# Patient Record
Sex: Female | Born: 1990 | Race: White | Hispanic: No | Smoking: Former smoker
Health system: Southern US, Community
[De-identification: ages and names within clinical notes are randomized; demographics above are authoritative.]

## PROBLEM LIST (undated history)

## (undated) DIAGNOSIS — N3090 Cystitis, unspecified without hematuria: Secondary | ICD-10-CM

## (undated) DIAGNOSIS — A6000 Herpesviral infection of urogenital system, unspecified: Secondary | ICD-10-CM

## (undated) DIAGNOSIS — F988 Other specified behavioral and emotional disorders with onset usually occurring in childhood and adolescence: Secondary | ICD-10-CM

## (undated) DIAGNOSIS — J45909 Unspecified asthma, uncomplicated: Secondary | ICD-10-CM

## (undated) HISTORY — DX: Herpesviral infection of urogenital system, unspecified: A60.00

## (undated) HISTORY — DX: Other specified behavioral and emotional disorders with onset usually occurring in childhood and adolescence: F98.8

## (undated) HISTORY — PX: NO PAST SURGERIES: SHX2092

---
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
## 2010-12-08 DIAGNOSIS — L80 Vitiligo: Secondary | ICD-10-CM | POA: Insufficient documentation

## 2011-12-13 LAB — HM HIV SCREENING LAB: HM HIV Screening: NEGATIVE

## 2011-12-13 LAB — HM PAP SMEAR: HM Pap smear: NEGATIVE

## 2012-11-19 ENCOUNTER — Emergency Department: Payer: Self-pay | Admitting: Emergency Medicine

## 2012-12-06 ENCOUNTER — Emergency Department: Payer: Self-pay | Admitting: Emergency Medicine

## 2012-12-06 LAB — URINALYSIS, COMPLETE
Bilirubin,UR: NEGATIVE
Glucose,UR: NEGATIVE mg/dL (ref 0–75)
Ketone: NEGATIVE
Ph: 7 (ref 4.5–8.0)
Protein: 30
Specific Gravity: 1.015 (ref 1.003–1.030)
Squamous Epithelial: 4

## 2012-12-06 LAB — GC/CHLAMYDIA PROBE AMP

## 2012-12-06 LAB — WET PREP, GENITAL

## 2013-08-05 ENCOUNTER — Emergency Department: Payer: Self-pay | Admitting: Emergency Medicine

## 2013-08-05 LAB — COMPREHENSIVE METABOLIC PANEL
ALBUMIN: 3.8 g/dL (ref 3.4–5.0)
Alkaline Phosphatase: 68 U/L
Anion Gap: 6 — ABNORMAL LOW (ref 7–16)
BUN: 15 mg/dL (ref 7–18)
Bilirubin,Total: 0.3 mg/dL (ref 0.2–1.0)
CREATININE: 0.78 mg/dL (ref 0.60–1.30)
Calcium, Total: 8.6 mg/dL (ref 8.5–10.1)
Chloride: 104 mmol/L (ref 98–107)
Co2: 27 mmol/L (ref 21–32)
EGFR (African American): >60
GLUCOSE: 90 mg/dL (ref 65–99)
OSMOLALITY: 274 (ref 275–301)
Potassium: 3.8 mmol/L (ref 3.5–5.1)
SGOT(AST): 13 U/L — ABNORMAL LOW (ref 15–37)
SGPT (ALT): 20 U/L (ref 12–78)
SODIUM: 137 mmol/L (ref 136–145)
Total Protein: 7.7 g/dL (ref 6.4–8.2)

## 2013-08-05 LAB — CBC
HCT: 36.1 % (ref 35.0–47.0)
HGB: 12 g/dL (ref 12.0–16.0)
MCH: 28.9 pg (ref 26.0–34.0)
MCHC: 33.2 g/dL (ref 32.0–36.0)
MCV: 87 fL (ref 80–100)
Platelet: 242 10*3/uL (ref 150–440)
RBC: 4.15 10*6/uL (ref 3.80–5.20)
RDW: 12.8 % (ref 11.5–14.5)
WBC: 8.3 10*3/uL (ref 3.6–11.0)

## 2013-08-05 LAB — URINALYSIS, COMPLETE
BILIRUBIN, UR: NEGATIVE
BLOOD: NEGATIVE
GLUCOSE, UR: NEGATIVE mg/dL (ref 0–75)
Ketone: NEGATIVE
Leukocyte Esterase: NEGATIVE
Nitrite: NEGATIVE
PROTEIN: NEGATIVE
Ph: 6 (ref 4.5–8.0)
SPECIFIC GRAVITY: 1.027 (ref 1.003–1.030)

## 2013-10-20 ENCOUNTER — Emergency Department: Payer: Self-pay | Admitting: Emergency Medicine

## 2013-10-20 LAB — URINALYSIS, COMPLETE: Specific Gravity: 1.023 (ref 1.003–1.030)

## 2013-10-22 LAB — URINE CULTURE

## 2013-12-18 ENCOUNTER — Emergency Department: Payer: Self-pay | Admitting: Student

## 2015-01-27 IMAGING — CT CT HEAD WITHOUT CONTRAST
1 series · 16 of 30 positions shown, 20 images · non-contrast
Comparison: None.

CLINICAL DATA: Dizziness and headache. Hit in head last night.
Nausea and vomiting last night.

EXAM:
CT HEAD WITHOUT CONTRAST
TECHNIQUE: Contiguous axial images were obtained from the base of the skull
through the vertex without intravenous contrast.

[Series 2: head wo · axial · 0.45mm/px · z∈[-55,+89]mm · 16 of 36 slices shown, 20 images]
[im 2/36  brain]
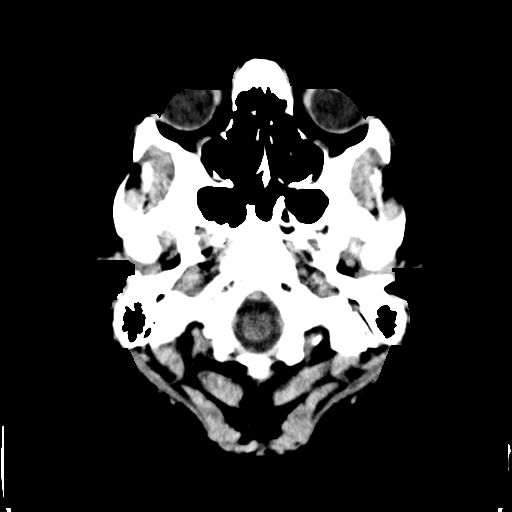
[im 2/36  bone]
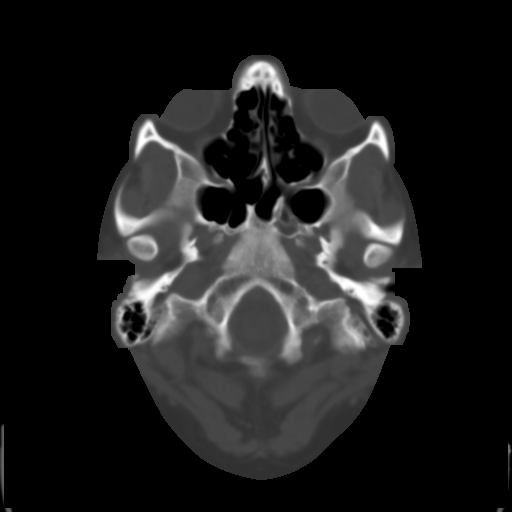
[im 4/36  brain]
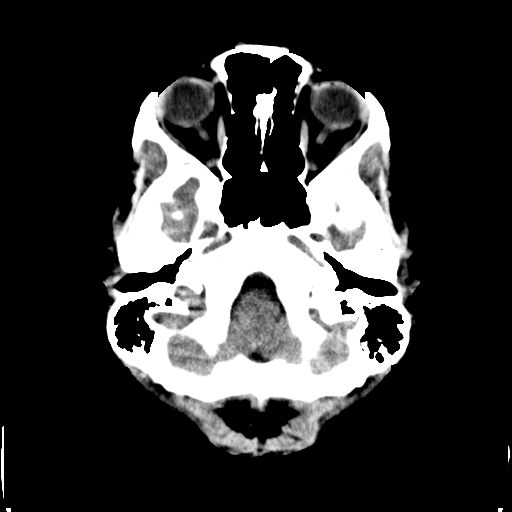
[im 7/36  brain]
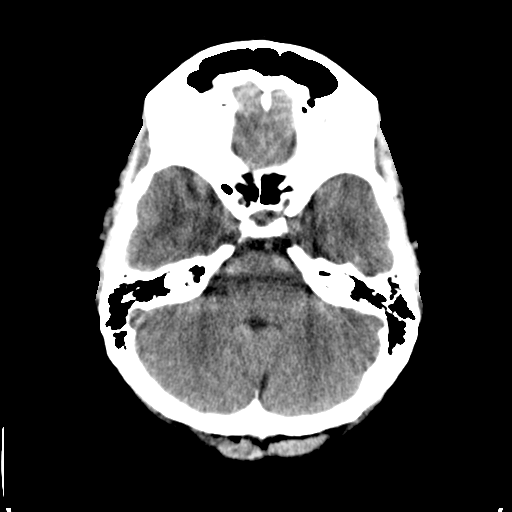
[im 9/36  brain]
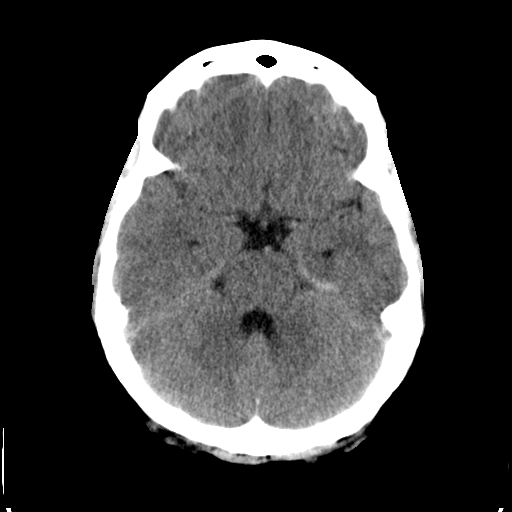
[im 10/36  brain]
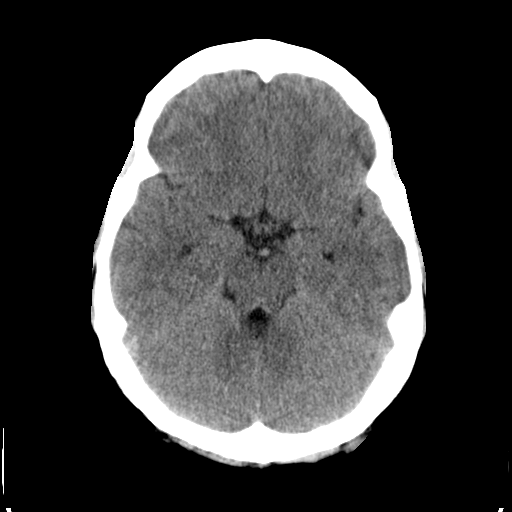
[im 10/36  bone]
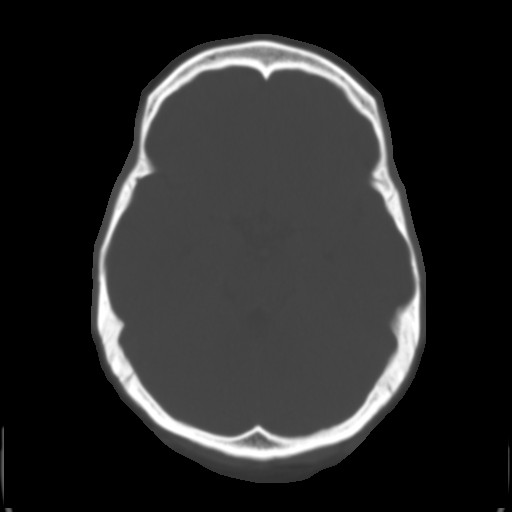
[im 13/36  brain]
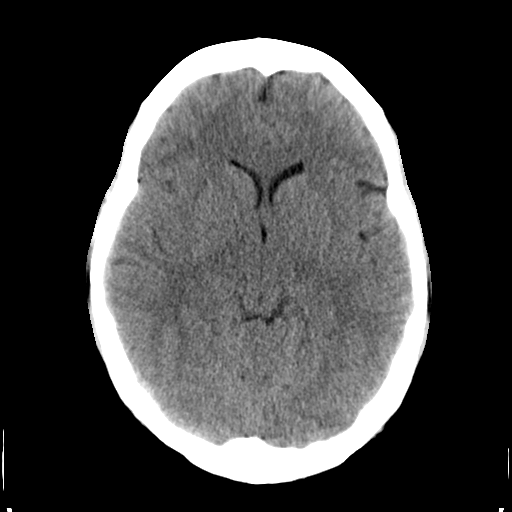
[im 15/36  brain]
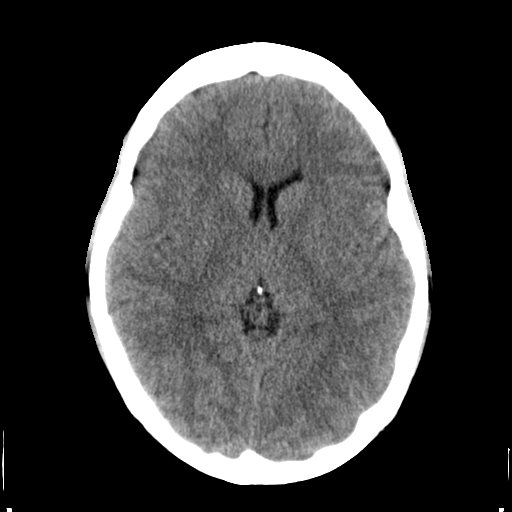
[im 17/36  brain]
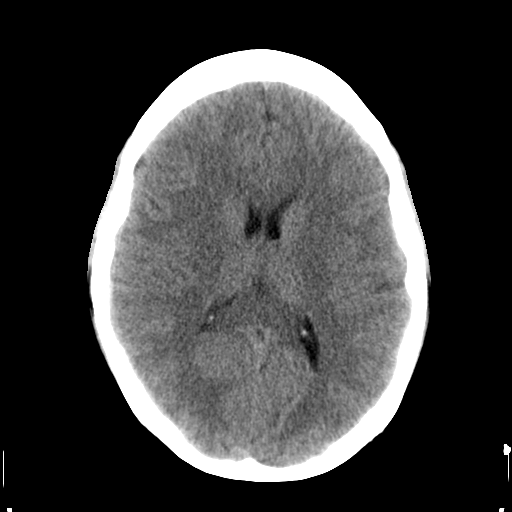
[im 19/36  brain]
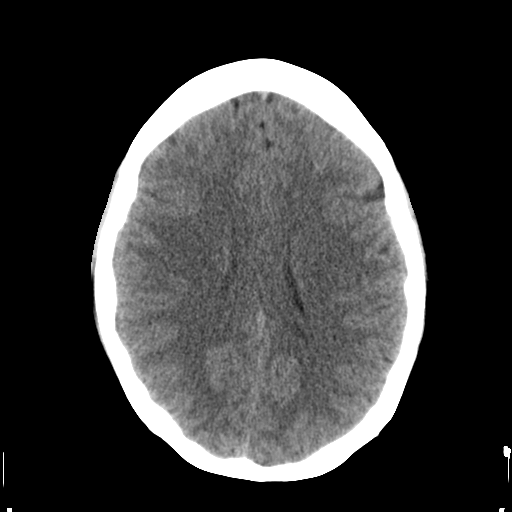
[im 19/36  bone]
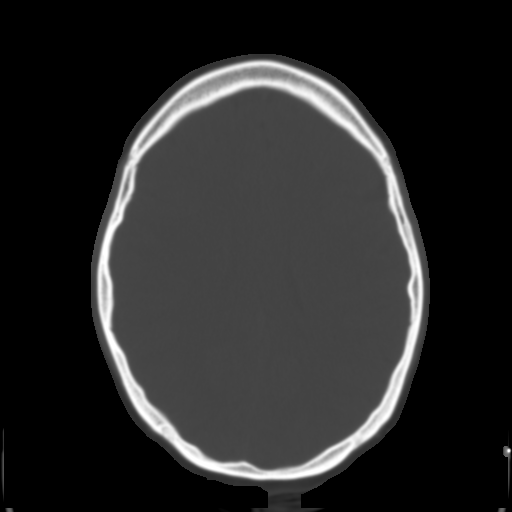
[im 21/36  brain]
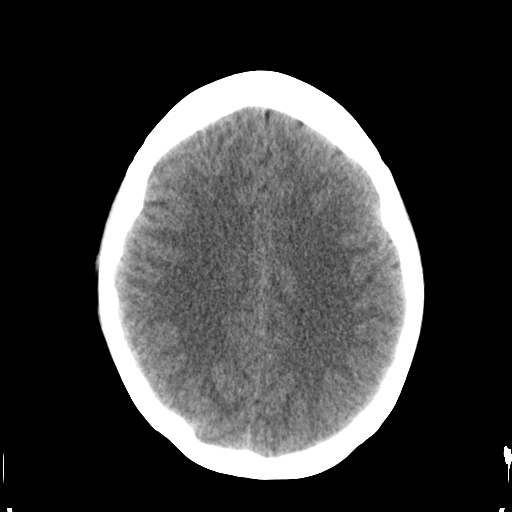
[im 23/36  brain]
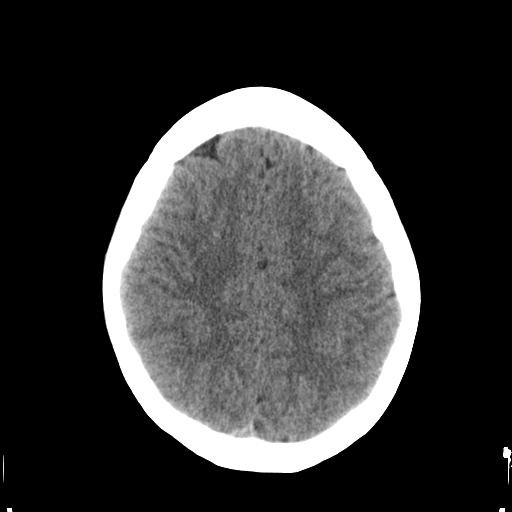
[im 26/36  brain]
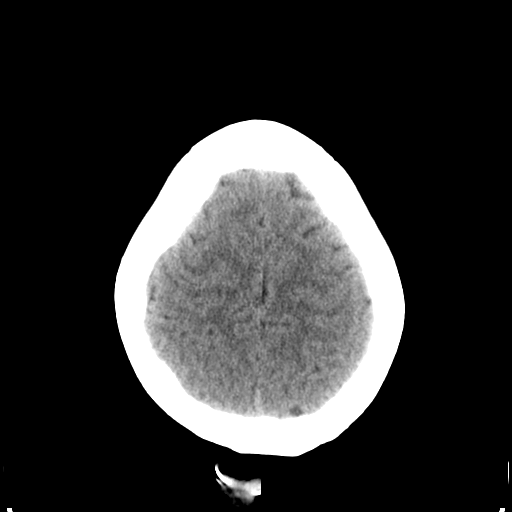
[im 27/36  brain]
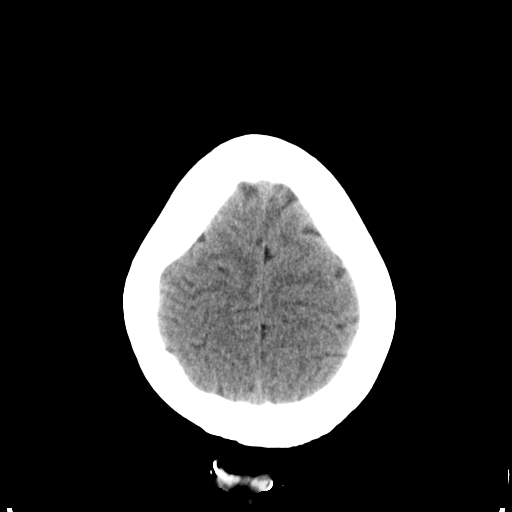
[im 27/36  bone]
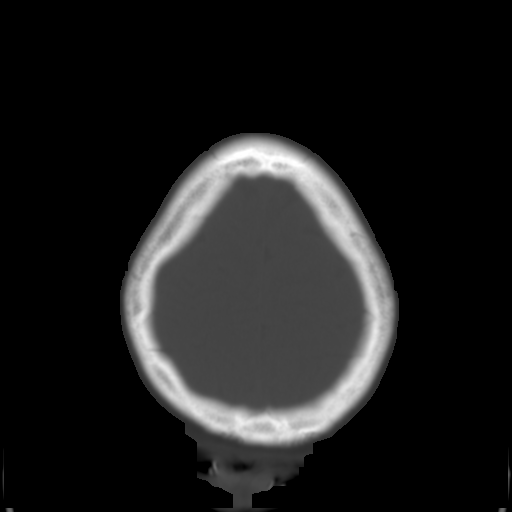
[im 29/36  brain]
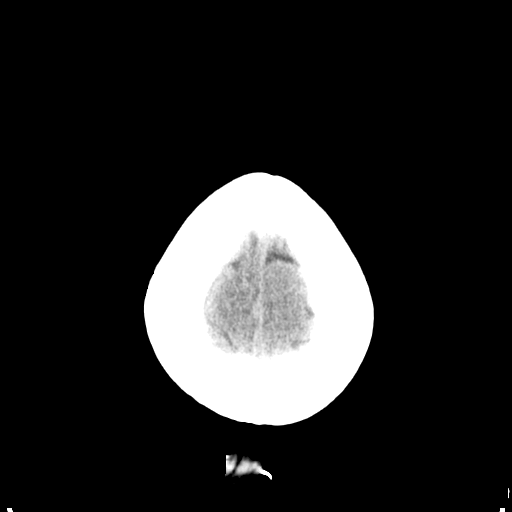
[im 32/36  brain]
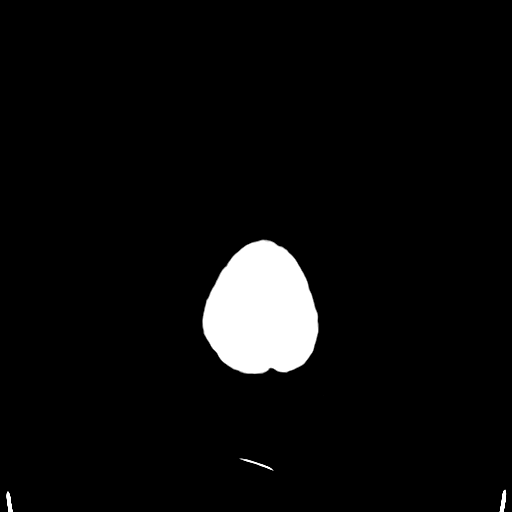
[im 34/36  brain]
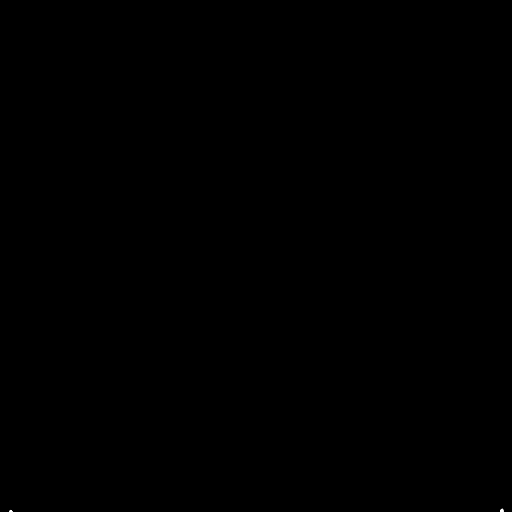

[16 of 30 positions shown; findings below may reference images not displayed]

FINDINGS: No evidence of an acute infarct, acute hemorrhage, mass lesion, mass
effect or hydrocephalus. No air-fluid levels in the paranasal
sinuses or mastoid air cells. No fracture.
IMPRESSION: Negative.

## 2015-05-17 ENCOUNTER — Emergency Department
Admission: EM | Admit: 2015-05-17 | Discharge: 2015-05-17 | Disposition: A | Discharge status: Home / Self Care | Payer: Self-pay | Attending: Emergency Medicine | Admitting: Emergency Medicine

## 2015-05-17 ENCOUNTER — Encounter: Payer: Self-pay | Admitting: Medical Oncology

## 2015-05-17 DIAGNOSIS — J4521 Mild intermittent asthma with (acute) exacerbation: Secondary | ICD-10-CM | POA: Insufficient documentation

## 2015-05-17 DIAGNOSIS — J4520 Mild intermittent asthma, uncomplicated: Secondary | ICD-10-CM

## 2015-05-17 HISTORY — DX: Unspecified asthma, uncomplicated: J45.909

## 2015-05-17 MED ORDER — IPRATROPIUM-ALBUTEROL 0.5-2.5 (3) MG/3ML IN SOLN
3.0000 mL | Freq: Once | RESPIRATORY_TRACT | Status: AC
  Administered 2015-05-17: 3 mL via RESPIRATORY_TRACT

## 2015-05-17 MED ORDER — PREDNISONE 10 MG PO TABS: 50.0000 mg | ORAL_TABLET | Freq: Every day | ORAL | Status: DC

## 2015-05-17 MED ORDER — ALBUTEROL SULFATE HFA 108 (90 BASE) MCG/ACT IN AERS: 2.0000 | INHALATION_SPRAY | Freq: Four times a day (QID) | RESPIRATORY_TRACT | Status: DC | PRN

## 2015-05-17 NOTE — ED Notes (Signed)
Pt reports hx of asthma, woke up this am having wheezing and sob. Out of inhaler.

## 2015-05-17 NOTE — Discharge Instructions (Signed)

## 2015-05-17 NOTE — ED Provider Notes (Signed)
[facility] Emergency Department Provider Note  ____________________________________________  Time seen: Approximately 10:01 AM  I have reviewed the triage vital signs and the nursing notes.   HISTORY  Chief Complaint Asthma    HPI ELESHIA WOOLEY is a 25 y.o. female with a history of asthma woke up this morning wheezing and shortness of breath. Patient reports that she is out of her inhaler. She denies any fever chills nausea vomiting or productive cough. Patient states that she's had asthma for greater than 10 years, currently does not have any insurance and cannot always afford inhaler. No known triggering factor just happens periodically.   Past Medical History  Diagnosis Date  . Asthma     There are no active problems to display for this patient.   History reviewed. No pertinent past surgical history.  Current Outpatient Rx  Name  Route  Sig  Dispense  Refill  . albuterol (PROVENTIL HFA;VENTOLIN HFA) 108 (90 Base) MCG/ACT inhaler   Inhalation   Inhale 2 puffs into the lungs every 6 (six) hours as needed for wheezing or shortness of breath.   1 Inhaler   11   . predniSONE (DELTASONE) 10 MG tablet   Oral   Take 5 tablets (50 mg total) by mouth daily with breakfast.   25 tablet   0     Allergies Review of patient's allergies indicates no known allergies.  No family history on file.  Social History Social History  Substance Use Topics  . Smoking status: Never Smoker   . Smokeless tobacco: None  . Alcohol Use: None    Review of Systems Constitutional: No fever/chills Eyes: No visual changes. ENT: No sore throat. Cardiovascular: Denies chest pain. Respiratory: Positive for shortness of breath and wheezing. Gastrointestinal: No abdominal pain.  No nausea, no vomiting.  No diarrhea.  No constipation. Genitourinary: Negative for dysuria. Musculoskeletal: Negative for back pain. Skin: Negative for rash. Neurological: Negative for  headaches, focal weakness or numbness.  10-point ROS otherwise negative.  ____________________________________________   PHYSICAL EXAM:  VITAL SIGNS: ED Triage Vitals  Enc Vitals Group     BP 05/17/15 0957 126/65 mmHg     Pulse Rate 05/17/15 0955 78     Resp 05/17/15 0955 20     Temp 05/17/15 0955 98.6 F (37 C)     Temp Source 05/17/15 0955 Oral     SpO2 05/17/15 0955 100 %     Weight 05/17/15 0955 240 lb (108.863 kg)     Height 05/17/15 0955  (1.803 m)     Head Cir --      Peak Flow --      Pain Score --      Pain Loc --      Pain Edu? --      Excl. in GC? --     Constitutional: Alert and oriented. Well appearing and in no acute distress. Eyes: Conjunctivae are normal. PERRL. EOMI. Head: Atraumatic. Nose: No congestion/rhinnorhea. Mouth/Throat: Mucous membranes are moist.  Oropharynx non-erythematous. Neck: No stridor.   Cardiovascular: Normal rate, regular rhythm. Grossly normal heart sounds.  Good peripheral circulation. Respiratory: Normal respiratory effort.  No retractions. Lungs decreased breath sounds bilaterally with active wheezing noted. And eat. Neurologic:  Normal speech and language. No gross focal neurologic deficits are appreciated. No gait instability. Skin:  Skin is warm, dry and intact. No rash noted. Psychiatric: Mood and affect are normal. Speech and behavior are normal.  ____________________________________________   LABS (all  labs ordered are listed, but only abnormal results are displayed)  Labs Reviewed - No data to display   RADIOLOGY  Deferred at this visit ____________________________________________   PROCEDURES  Procedure(s) performed: None  Critical Care performed: No  ____________________________________________   INITIAL IMPRESSION / ASSESSMENT AND PLAN / ED COURSE  Pertinent labs & imaging results that were available during my care of the patient were reviewed by me and considered in my medical decision making  (see chart for details).  Acute exacerbation of asthma. DuoNeb 1 given with significant relief and improvement. Patient will be discharged with new Rx for albuterol inhaler and follow-up with her PCP as soon as possible. She voices no other emergency medical complaints at this time and discharged from the hospital much improved. ____________________________________________   FINAL CLINICAL IMPRESSION(S) / ED DIAGNOSES  Final diagnoses:  Asthma, mild intermittent, uncomplicated     This chart was dictated using voice recognition software/Dragon. Despite best efforts to proofread, errors can occur which can change the meaning. Any change was purely unintentional.   Evangeline Dakin, PA-C 05/17/15 1051  Myrna Blazer, MD 05/17/15 [id number]

## 2015-05-17 NOTE — ED Notes (Addendum)
Pt states she ran out of her inhaler last night. She uses an Albuterol inhaler. Recently has been sick last per patient.  Pt is short of breath easily when speaking and is able to speak only in short sentences.

## 2017-05-28 ENCOUNTER — Other Ambulatory Visit: Payer: Self-pay

## 2017-05-28 ENCOUNTER — Encounter: Payer: Self-pay | Admitting: Gynecology

## 2017-05-28 ENCOUNTER — Ambulatory Visit
Admission: EM | Admit: 2017-05-28 | Discharge: 2017-05-28 | Disposition: A | Discharge status: Home / Self Care | Payer: Self-pay | Attending: Family Medicine | Admitting: Family Medicine

## 2017-05-28 DIAGNOSIS — Z202 Contact with and (suspected) exposure to infections with a predominantly sexual mode of transmission: Secondary | ICD-10-CM

## 2017-05-28 DIAGNOSIS — Z3202 Encounter for pregnancy test, result negative: Secondary | ICD-10-CM

## 2017-05-28 DIAGNOSIS — Z113 Encounter for screening for infections with a predominantly sexual mode of transmission: Secondary | ICD-10-CM

## 2017-05-28 LAB — CHLAMYDIA/NGC RT PCR (ARMC ONLY)
CHLAMYDIA TR: DETECTED — AB
N GONORRHOEAE: NOT DETECTED

## 2017-05-28 LAB — PREGNANCY, URINE: PREG TEST UR: NEGATIVE

## 2017-05-28 MED ORDER — AZITHROMYCIN 500 MG PO TABS
1000.0000 mg | ORAL_TABLET | Freq: Every day | ORAL | Status: DC
  Administered 2017-05-28: 1000 mg via ORAL

## 2017-05-28 MED ORDER — CEFTRIAXONE SODIUM 250 MG IJ SOLR
250.0000 mg | Freq: Once | INTRAMUSCULAR | Status: AC
  Administered 2017-05-28: 250 mg via INTRAMUSCULAR

## 2017-05-28 NOTE — ED Provider Notes (Signed)
[facility] ____________________________________________  Time seen: Approximately 1:25 PM  I have reviewed the triage vital signs and the nursing notes.   HISTORY  Chief Complaint Exposure to STD  HPI Diane Williamson is a 27 y.o. female presenting for evaluation and screening for STDs.  Patient reports that she was told yesterday by her husband that he tested positive for chlamydia.  Patient states that her last sexual intercourse was approximately 3 days prior.  Intermittently uses condoms, not always.  Patient states that she is asymptomatic.  Denies any abdominal pain, pelvic pain, vaginal discomfort, vaginal discharge, vaginal odor, back pain, fevers, dysuria or other complaints.  No rash or lesions.  Reports otherwise feels fine.  States he would like to go ahead and have other STD testing performed.  Denies recent antibiotic use. Denies recent sickness.    Past Medical History:  Diagnosis Date  . Asthma     There are no active problems to display for this patient.   History reviewed. No pertinent surgical history.    Current Facility-Administered Medications:  .  azithromycin (ZITHROMAX) tablet 1,000 mg, 1,000 mg, Oral, Daily, Renford Dills, NP, 1,000 mg at 05/28/17 1320  Current Outpatient Medications:  .  albuterol (PROVENTIL HFA;VENTOLIN HFA) 108 (90 Base) MCG/ACT inhaler, Inhale 2 puffs into the lungs every 6 (six) hours as needed for wheezing or shortness of breath., Disp: 1 Inhaler, Rfl: 11 .  fluticasone-salmeterol (ADVAIR HFA) 115-21 MCG/ACT inhaler, Inhale 2 puffs into the lungs 2 (two) times daily., Disp: , Rfl:   Allergies Benadryl allergy [diphenhydramine hcl]  Family History  Problem Relation Age of Onset  . Diabetes Mother   . Cancer Mother   . Diabetes Father   . Cancer Father     Social History Social History   Tobacco Use  . Smoking status: Former Games developer  . Smokeless tobacco: Never Used  Substance Use Topics  . Alcohol use:  Yes    Comment: occasion  . Drug use: No    Review of Systems Constitutional: No fever/chills ENT: No sore throat. Cardiovascular: Denies chest pain. Respiratory: Denies shortness of breath. Gastrointestinal: No abdominal pain.  No nausea, no vomiting.  No diarrhea.   Genitourinary: Negative for dysuria. Musculoskeletal: Negative for back pain. Skin: Negative for rash.  ____________________________________________   PHYSICAL EXAM:  VITAL SIGNS: ED Triage Vitals  Enc Vitals Group     BP 05/28/17 1243 132/79     Pulse Rate 05/28/17 1243 86     Resp -- 18     Temp 05/28/17 1243 98.2 F (36.8 C)     Temp Source 05/28/17 1243 Oral     SpO2 05/28/17 1243 100 %     Weight 05/28/17 1241 260 lb (117.9 kg)     Height 05/28/17 1241 5\' 10"  (1.778 m)     Head Circumference --      Peak Flow --      Pain Score 05/28/17 1241 0     Pain Loc --      Pain Edu? --      Excl. in GC? --     Constitutional: Alert and oriented. Well appearing and in no acute distress. Cardiovascular: Normal rate, regular rhythm. Grossly normal heart sounds.  Good peripheral circulation. Respiratory: Normal respiratory effort without tachypnea nor retractions. Breath sounds are clear and equal bilaterally. No wheezes, rales, rhonchi. Gastrointestinal: Soft and nontender.No CVA tenderness. Pelvic: Declined Musculoskeletal No midline cervical, thoracic or lumbar tenderness to palpation.  Neurologic:  Normal  speech and language. Speech is normal. No gait instability.  Skin:  Skin is warm, dry and intact. No rash noted. Psychiatric: Mood and affect are normal. Speech and behavior are normal. Patient exhibits appropriate insight and judgment   ___________________________________________   LABS (all labs ordered are listed, but only abnormal results are displayed)  Labs Reviewed  CHLAMYDIA/NGC RT PCR ([facility] ONLY)  PREGNANCY, URINE  MISC [facility] TEST (SEND OUT)  HIV ANTIBODY (ROUTINE TESTING)    HEPATITIS PANEL, ACUTE  HSV(HERPES SIMPLEX VRS) I + II AB-IGG  HSV(HERPES SIMPLEX VRS) I + II AB-IGM  RPR   ____________________________________________   PROCEDURES Procedures   INITIAL IMPRESSION / ASSESSMENT AND PLAN / ED COURSE  Pertinent labs & imaging results that were available during my care of the patient were reviewed by me and considered in my medical decision making (see chart for details).  Well-appearing patient.  No acute distress.  Presenting for testing and screening for STDs.  Positive exposure to chlamydia from his spouse.  Discussed will go ahead and treat prophylactically for gonorrhea and chlamydia, 1 g oral azithromycin and 250 mg IM Rocephin given once in urgent care.  Discussed testing, patient requested gonorrhea, chlamydia and trichomonas urine test, declined pelvic exam.  Also serum HIV hepatitis herpes and RPR.  Discussed no sexual activity for at least 1 week.  Encourage follow-up prior to resuming.  Encourage supportive care and safe sex.   Discussed follow up with Primary care physician this week. Discussed follow up and return parameters including no resolution or any worsening concerns. Patient verbalized understanding and agreed to plan.   ____________________________________________   FINAL CLINICAL IMPRESSION(S) / ED DIAGNOSES  Final diagnoses:  STD exposure  Screen for STD (sexually transmitted disease)     ED Discharge Orders    None       Note: This dictation was prepared with Dragon dictation along with smaller phrase technology. Any transcriptional errors that result from this process are unintentional.         Renford DillsMiller, Joannah Gitlin, NP 05/28/17 1346

## 2017-05-28 NOTE — Discharge Instructions (Signed)
No sexual activity as discussed. Follow up prior to resuming.   Follow up with your primary care physician or the above this week as needed. Return to Urgent care for new or worsening concerns.

## 2017-05-28 NOTE — ED Triage Notes (Signed)
Per patient husband told her he was tested positive for chlamydia.

## 2017-05-30 ENCOUNTER — Ambulatory Visit
Admission: EM | Admit: 2017-05-30 | Discharge: 2017-05-30 | Disposition: A | Discharge status: Home / Self Care | Payer: Self-pay | Attending: Family Medicine | Admitting: Family Medicine

## 2017-05-30 ENCOUNTER — Other Ambulatory Visit: Payer: Self-pay

## 2017-05-30 DIAGNOSIS — N3001 Acute cystitis with hematuria: Secondary | ICD-10-CM

## 2017-05-30 DIAGNOSIS — A599 Trichomoniasis, unspecified: Secondary | ICD-10-CM

## 2017-05-30 LAB — HEPATITIS PANEL, ACUTE
HCV Ab: <0.1 s/co ratio (ref 0.0–0.9)
Hep A IgM: NEGATIVE
Hep B C IgM: NEGATIVE
Hepatitis B Surface Ag: NEGATIVE

## 2017-05-30 LAB — URINALYSIS, COMPLETE (UACMP) WITH MICROSCOPIC
Bilirubin Urine: NEGATIVE
GLUCOSE, UA: NEGATIVE mg/dL
HGB URINE DIPSTICK: NEGATIVE
Ketones, ur: NEGATIVE mg/dL
NITRITE: NEGATIVE
Protein, ur: NEGATIVE mg/dL
Specific Gravity, Urine: >1.03 — ABNORMAL HIGH (ref 1.005–1.030)
pH: 5.5 (ref 5.0–8.0)

## 2017-05-30 LAB — HIV ANTIBODY (ROUTINE TESTING W REFLEX): HIV Screen 4th Generation wRfx: NONREACTIVE

## 2017-05-30 LAB — RPR: RPR: NONREACTIVE

## 2017-05-30 LAB — HSV(HERPES SIMPLEX VRS) I + II AB-IGG: HSV 1 Glycoprotein G Ab, IgG: <0.91 index (ref 0.00–0.90)

## 2017-05-30 MED ORDER — CEPHALEXIN 500 MG PO CAPS: 500.0000 mg | ORAL_CAPSULE | Freq: Two times a day (BID) | ORAL | 0 refills | Status: DC

## 2017-05-30 MED ORDER — METRONIDAZOLE 500 MG PO TABS: ORAL_TABLET | ORAL | 0 refills | Status: DC

## 2017-05-30 NOTE — ED Triage Notes (Signed)
Pt reports burning with urination starting this a.m. Recently treated for GC/Chlamydia.

## 2017-05-30 NOTE — ED Provider Notes (Signed)
[facility]    CSN: [id number] Arrival date & time: 05/30/17  1631     History   Chief Complaint Chief Complaint  Patient presents with  . Dysuria    HPI Diane Williamson is a 27 y.o. female.   The history is provided by the patient.  Dysuria  Pain quality:  Burning Pain severity:  Mild Onset quality:  Sudden Duration:  12 hours Timing:  Constant Progression:  Unchanged Chronicity:  New Recent urinary tract infections: no   Relieved by:  None tried Ineffective treatments:  None tried Urinary symptoms: frequent urination   Associated symptoms: no abdominal pain, no fever, no flank pain, no genital lesions, no nausea, no vaginal discharge and no vomiting   Risk factors: sexually active and sexually transmitted infections (recently seen and treated for chlamydia)   Risk factors: no hx of pyelonephritis, no hx of urolithiasis, no kidney transplant, not pregnant, no recurrent urinary tract infections, no renal cysts, no renal disease, no single kidney and no urinary catheter     Past Medical History:  Diagnosis Date  . Asthma     There are no active problems to display for this patient.   History reviewed. No pertinent surgical history.  OB History    No data available       Home Medications    Prior to Admission medications   Medication Sig Start Date End Date Taking? Authorizing Provider  albuterol (PROVENTIL HFA;VENTOLIN HFA) 108 (90 Base) MCG/ACT inhaler Inhale 2 puffs into the lungs every 6 (six) hours as needed for wheezing or shortness of breath. 05/17/15   Beers, Charmayne Sheer, PA-C  cephALEXin (KEFLEX) 500 MG capsule Take 1 capsule (500 mg total) by mouth 2 (two) times daily. 05/30/17   Payton Mccallum, MD  fluticasone-salmeterol (ADVAIR HFA) 272-53 MCG/ACT inhaler Inhale 2 puffs into the lungs 2 (two) times daily.    [provider]  metroNIDAZOLE (FLAGYL) 500 MG tablet Take 4 tablets po once 05/30/17   Payton Mccallum, MD    Family  History Family History  Problem Relation Age of Onset  . Diabetes Mother   . Cancer Mother   . Diabetes Father   . Cancer Father     Social History Social History   Tobacco Use  . Smoking status: Former Games developer  . Smokeless tobacco: Never Used  Substance Use Topics  . Alcohol use: Yes    Comment: occasion  . Drug use: No     Allergies   Benadryl allergy [diphenhydramine hcl]   Review of Systems Review of Systems  Constitutional: Negative for fever.  Gastrointestinal: Negative for abdominal pain, nausea and vomiting.  Genitourinary: Positive for dysuria. Negative for flank pain and vaginal discharge.     Physical Exam Triage Vital Signs ED Triage Vitals  Enc Vitals Group     BP 05/30/17 1659 125/75     Pulse Rate 05/30/17 1659 90     Resp 05/30/17 1659 18     Temp 05/30/17 1659 98.4 F (36.9 C)     Temp Source 05/30/17 1659 Oral     SpO2 05/30/17 1659 100 %     Weight 05/30/17 1701 260 lb (117.9 kg)     Height 05/30/17 1701 5\' 10"  (1.778 m)     Head Circumference --      Peak Flow --      Pain Score 05/30/17 1701 3     Pain Loc --      Pain Edu? --  Excl. in GC? --    No data found.  Updated Vital Signs BP 125/75 (BP Location: Left Arm)   Pulse 90   Temp 98.4 F (36.9 C) (Oral)   Resp 18   Ht 5\' 10"  (1.778 m)   Wt 260 lb (117.9 kg)   LMP 04/29/2017   SpO2 100%   BMI 37.31 kg/m   Visual Acuity Right Eye Distance:   Left Eye Distance:   Bilateral Distance:    Right Eye Near:   Left Eye Near:    Bilateral Near:     Physical Exam  Constitutional: She appears well-developed and well-nourished. No distress.  Abdominal: Soft. She exhibits no distension.  Skin: She is not diaphoretic.  Nursing note and vitals reviewed.    UC Treatments / Results  Labs (all labs ordered are listed, but only abnormal results are displayed) Labs Reviewed  URINALYSIS, COMPLETE (UACMP) WITH MICROSCOPIC - Abnormal; Notable for the following components:       Result Value   APPearance HAZY (*)    Specific Gravity, Urine >1.030 (*)    Leukocytes, UA MODERATE (*)    Squamous Epithelial / LPF 6-30 (*)    Bacteria, UA FEW (*)    All other components within normal limits  URINE CULTURE    EKG  EKG Interpretation None       Radiology No results found.  Procedures Procedures (including critical care time)  Medications Ordered in UC Medications - No data to display   Initial Impression / Assessment and Plan / UC Course  I have reviewed the triage vital signs and the nursing notes.  Pertinent labs & imaging results that were available during my care of the patient were reviewed by me and considered in my medical decision making (see chart for details).       Final Clinical Impressions(s) / UC Diagnoses   Final diagnoses:  Acute cystitis with hematuria  Trichomonas infection    ED Discharge Orders        Ordered    cephALEXin (KEFLEX) 500 MG capsule  2 times daily     05/30/17 1726    metroNIDAZOLE (FLAGYL) 500 MG tablet     05/30/17 1740     1. Lab results and diagnosis reviewed with patient 2. rx as per orders above; reviewed possible side effects, interactions, risks and benefits  3. Recommend supportive treatment with increased water intake 4. Follow-up prn if symptoms worsen or don't improve  Controlled Substance Prescriptions [place] Controlled Substance Registry consulted? Not Applicable   Payton Mccallumonty, Atlee Kluth, MD 05/30/17 1743

## 2017-05-31 LAB — HSV(HERPES SIMPLEX VRS) I + II AB-IGM

## 2017-06-01 LAB — URINE CULTURE: SPECIAL REQUESTS: NORMAL

## 2017-06-01 LAB — MISC LABCORP TEST (SEND OUT): LABCORP TEST CODE: 188052

## 2017-06-07 ENCOUNTER — Ambulatory Visit
Admission: EM | Admit: 2017-06-07 | Discharge: 2017-06-07 | Disposition: A | Discharge status: Home / Self Care | Payer: Self-pay | Attending: Family Medicine | Admitting: Family Medicine

## 2017-06-07 ENCOUNTER — Other Ambulatory Visit: Payer: Self-pay

## 2017-06-07 DIAGNOSIS — R350 Frequency of micturition: Secondary | ICD-10-CM

## 2017-06-07 LAB — URINALYSIS, COMPLETE (UACMP) WITH MICROSCOPIC
Bilirubin Urine: NEGATIVE
GLUCOSE, UA: NEGATIVE mg/dL
LEUKOCYTES UA: NEGATIVE
NITRITE: NEGATIVE
Protein, ur: NEGATIVE mg/dL
Specific Gravity, Urine: >1.03 — ABNORMAL HIGH (ref 1.005–1.030)
WBC UA: NONE SEEN WBC/hpf (ref 0–5)
pH: 6 (ref 5.0–8.0)

## 2017-06-07 MED ORDER — SULFAMETHOXAZOLE-TRIMETHOPRIM 800-160 MG PO TABS: 1.0000 | ORAL_TABLET | Freq: Two times a day (BID) | ORAL | 0 refills | Status: DC

## 2017-06-07 NOTE — ED Provider Notes (Signed)
[facility]    CSN: [id number] Arrival date & time: 06/07/17  1518     History   Chief Complaint Chief Complaint  Patient presents with  . Urinary Frequency    HPI Diane Williamson is a 27 y.o. female.   The history is provided by the patient.  Urinary Frequency  This is a new problem. The current episode started more than 1 week ago. The problem occurs constantly. Pertinent negatives include no chest pain, no abdominal pain, no headaches and no shortness of breath. Associated symptoms comments: Denies vomiting, fevers, chills.. The symptoms are relieved by medications (improved when treated recently with keflex).    Past Medical History:  Diagnosis Date  . Asthma     There are no active problems to display for this patient.   Past Surgical History:  Procedure Laterality Date  . NO PAST SURGERIES      OB History    No data available       Home Medications    Prior to Admission medications   Medication Sig Start Date End Date Taking? Authorizing Provider  albuterol (PROVENTIL HFA;VENTOLIN HFA) 108 (90 Base) MCG/ACT inhaler Inhale 2 puffs into the lungs every 6 (six) hours as needed for wheezing or shortness of breath. 05/17/15  Yes Beers, Charmayne Sheer, PA-C  fluticasone-salmeterol (ADVAIR HFA) 115-21 MCG/ACT inhaler Inhale 2 puffs into the lungs 2 (two) times daily.   Yes [provider]  cephALEXin (KEFLEX) 500 MG capsule Take 1 capsule (500 mg total) by mouth 2 (two) times daily. 05/30/17   Payton Mccallum, MD  metroNIDAZOLE (FLAGYL) 500 MG tablet Take 4 tablets po once 05/30/17   Payton Mccallum, MD  sulfamethoxazole-trimethoprim (BACTRIM DS,SEPTRA DS) 800-160 MG tablet Take 1 tablet by mouth 2 (two) times daily. 06/07/17   Payton Mccallum, MD    Family History Family History  Problem Relation Age of Onset  . Diabetes Mother   . Cancer Mother   . Diabetes Father   . Cancer Father     Social History Social History   Tobacco Use  . Smoking  status: Former Games developer  . Smokeless tobacco: Never Used  Substance Use Topics  . Alcohol use: Yes    Comment: occasion  . Drug use: No     Allergies   Benadryl allergy [diphenhydramine hcl]   Review of Systems Review of Systems  Respiratory: Negative for shortness of breath.   Cardiovascular: Negative for chest pain.  Gastrointestinal: Negative for abdominal pain.  Genitourinary: Positive for frequency.  Neurological: Negative for headaches.     Physical Exam Triage Vital Signs ED Triage Vitals  Enc Vitals Group     BP 06/07/17 1529 133/75     Pulse Rate 06/07/17 1529 81     Resp 06/07/17 1529 16     Temp 06/07/17 1529 98.5 F (36.9 C)     Temp Source 06/07/17 1529 Oral     SpO2 06/07/17 1529 100 %     Weight 06/07/17 1529 260 lb (117.9 kg)     Height 06/07/17 1529 5\' 10"  (1.778 m)     Head Circumference --      Peak Flow --      Pain Score 06/07/17 1527 0     Pain Loc --      Pain Edu? --      Excl. in GC? --    No data found.  Updated Vital Signs BP 133/75 (BP Location: Left Arm)   Pulse 81  Temp 98.5 F (36.9 C) (Oral)   Resp 16   Ht 5\' 10"  (1.778 m)   Wt 260 lb (117.9 kg)   LMP 06/04/2017   SpO2 100%   BMI 37.31 kg/m   Visual Acuity Right Eye Distance:   Left Eye Distance:   Bilateral Distance:    Right Eye Near:   Left Eye Near:    Bilateral Near:     Physical Exam  Constitutional: She appears well-developed and well-nourished. No distress.  Abdominal: Soft. She exhibits no distension.  Skin: She is not diaphoretic.  Nursing note and vitals reviewed.    UC Treatments / Results  Labs (all labs ordered are listed, but only abnormal results are displayed) Labs Reviewed  URINALYSIS, COMPLETE (UACMP) WITH MICROSCOPIC - Abnormal; Notable for the following components:      Result Value   APPearance HAZY (*)    Specific Gravity, Urine >1.030 (*)    Hgb urine dipstick MODERATE (*)    Ketones, ur TRACE (*)    Squamous Epithelial / LPF  6-30 (*)    Bacteria, UA RARE (*)    All other components within normal limits  URINE CULTURE    EKG  EKG Interpretation None       Radiology No results found.  Procedures Procedures (including critical care time)  Medications Ordered in UC Medications - No data to display   Initial Impression / Assessment and Plan / UC Course  I have reviewed the triage vital signs and the nursing notes.  Pertinent labs & imaging results that were available during my care of the patient were reviewed by me and considered in my medical decision making (see chart for details).       Final Clinical Impressions(s) / UC Diagnoses   Final diagnoses:  Urinary frequency    ED Discharge Orders        Ordered    sulfamethoxazole-trimethoprim (BACTRIM DS,SEPTRA DS) 800-160 MG tablet  2 times daily     06/07/17 1606     1. Lab results and diagnosis reviewed with patient 2. rx as per orders above; reviewed possible side effects, interactions, risks and benefits  3. Check urine culture 4.Recommend supportive treatment with increased fluids 5. Follow-up prn if symptoms worsen or don't improve  Controlled Substance Prescriptions [place] Controlled Substance Registry consulted? Not Applicable   Payton Mccallumonty, Asjia Berrios, MD 06/07/17 [phone]

## 2017-06-07 NOTE — ED Triage Notes (Signed)
Patient complains of urinary frequency that started 05/28/2017. Patient states that she has had chills the entire time. Patient reports that she was seen and treated with Keflex and this helped symptoms while on medication but no improvement since.

## 2017-06-18 ENCOUNTER — Ambulatory Visit
Admission: EM | Admit: 2017-06-18 | Discharge: 2017-06-18 | Disposition: A | Discharge status: Home / Self Care | Payer: Self-pay | Attending: Family Medicine | Admitting: Family Medicine

## 2017-06-18 ENCOUNTER — Encounter: Payer: Self-pay | Admitting: Gynecology

## 2017-06-18 ENCOUNTER — Other Ambulatory Visit: Payer: Self-pay

## 2017-06-18 DIAGNOSIS — N390 Urinary tract infection, site not specified: Secondary | ICD-10-CM

## 2017-06-18 DIAGNOSIS — B373 Candidiasis of vulva and vagina: Secondary | ICD-10-CM

## 2017-06-18 DIAGNOSIS — N760 Acute vaginitis: Secondary | ICD-10-CM

## 2017-06-18 DIAGNOSIS — Z113 Encounter for screening for infections with a predominantly sexual mode of transmission: Secondary | ICD-10-CM

## 2017-06-18 DIAGNOSIS — B3731 Acute candidiasis of vulva and vagina: Secondary | ICD-10-CM

## 2017-06-18 LAB — URINALYSIS, COMPLETE (UACMP) WITH MICROSCOPIC
BILIRUBIN URINE: NEGATIVE
Glucose, UA: NEGATIVE mg/dL
Hgb urine dipstick: NEGATIVE
KETONES UR: NEGATIVE mg/dL
Leukocytes, UA: NEGATIVE
Nitrite: NEGATIVE
PH: 7.5 (ref 5.0–8.0)
Protein, ur: NEGATIVE mg/dL
Specific Gravity, Urine: 1.02 (ref 1.005–1.030)

## 2017-06-18 LAB — WET PREP, GENITAL
Clue Cells Wet Prep HPF POC: NONE SEEN
Sperm: NONE SEEN
TRICH WET PREP: NONE SEEN

## 2017-06-18 LAB — CHLAMYDIA/NGC RT PCR (ARMC ONLY)
CHLAMYDIA TR: NOT DETECTED
N gonorrhoeae: NOT DETECTED

## 2017-06-18 MED ORDER — NITROFURANTOIN MONOHYD MACRO 100 MG PO CAPS: 100.0000 mg | ORAL_CAPSULE | Freq: Two times a day (BID) | ORAL | 0 refills | Status: DC

## 2017-06-18 MED ORDER — FLUCONAZOLE 150 MG PO TABS: 150.0000 mg | ORAL_TABLET | Freq: Every day | ORAL | 0 refills | Status: DC

## 2017-06-18 NOTE — Discharge Instructions (Signed)
Take medication as prescribed. Rest. Drink plenty of fluids.  ° °Follow up with your primary care physician this week as needed. Return to Urgent care for new or worsening concerns.  ° °

## 2017-06-18 NOTE — ED Provider Notes (Signed)
[facility] ____________________________________________  Time seen: Approximately 4:52 PM  I have reviewed the triage vital signs and the nursing notes.   HISTORY  Chief Complaint Urinary Tract Infection and SEXUALLY TRANSMITTED DISEASE   HPI Diane Williamson is a 27 y.o. female presenting for evaluation of some urinary frequency, low back pain, vaginal irritation, vaginal itching and vaginal discharge that is been present over the last few days.  Patient has been seen multiple times in the last month in the urgent care for similar.  Patient has recently been treated for UTIs twice with Keflex and Bactrim, as well as treated with Flagyl for trichomonas and received treatment for chlamydia.  Patient states that she has not had sexual activity since prior to first visit of trichomonas and Chlamydia being positive.  Patient states that she wants to make sure that the STDs have fully been treated.  Again states no recent sexual activity.  Denies chance of pregnancy.  States some intermittent lower pelvic cramping, denies other abdominal pain.  Denies fevers.  Reports continues to eat and drink well.  Has not taken any over-the-counter medications for similar complaints.  Reports otherwise feels well. Denies chest pain, shortness of breath, or rash.    Past Medical History:  Diagnosis Date  . Asthma     There are no active problems to display for this patient.   Past Surgical History:  Procedure Laterality Date  . NO PAST SURGERIES       No current facility-administered medications for this encounter.   Current Outpatient Medications:  .  albuterol (PROVENTIL HFA;VENTOLIN HFA) 108 (90 Base) MCG/ACT inhaler, Inhale 2 puffs into the lungs every 6 (six) hours as needed for wheezing or shortness of breath., Disp: 1 Inhaler, Rfl: 11 .  fluticasone-salmeterol (ADVAIR HFA) 115-21 MCG/ACT inhaler, Inhale 2 puffs into the lungs 2 (two) times daily., Disp: , Rfl:  .  fluconazole  (DIFLUCAN) 150 MG tablet, Take 1 tablet (150 mg total) by mouth daily. Take one pill orally, then Repeat in one week as needed., Disp: 2 tablet, Rfl: 0 .  nitrofurantoin, macrocrystal-monohydrate, (MACROBID) 100 MG capsule, Take 1 capsule (100 mg total) by mouth 2 (two) times daily., Disp: 10 capsule, Rfl: 0  Allergies Benadryl allergy [diphenhydramine hcl]  Family History  Problem Relation Age of Onset  . Diabetes Mother   . Cancer Mother   . Diabetes Father   . Cancer Father     Social History Social History   Tobacco Use  . Smoking status: Former Games developermoker  . Smokeless tobacco: Never Used  Substance Use Topics  . Alcohol use: Yes    Comment: occasion  . Drug use: No    Review of Systems Constitutional: No fever/chills Cardiovascular: Denies chest pain. Respiratory: Denies shortness of breath. Gastrointestinal: No abdominal pain.  No nausea, no vomiting.  No diarrhea.   Genitourinary: As above Musculoskeletal: As above Skin: Negative for rash.   ____________________________________________   PHYSICAL EXAM:  VITAL SIGNS: ED Triage Vitals  Enc Vitals Group     BP 06/18/17 1312 134/71     Pulse Rate 06/18/17 1312 64     Resp 06/18/17 1312 16     Temp 06/18/17 1312 98.3 F (36.8 C)     Temp Source 06/18/17 1312 Oral     SpO2 06/18/17 1312 100 %     Weight 06/18/17 1309 260 lb (117.9 kg)     Height --      Head Circumference --  Peak Flow --      Pain Score 06/18/17 1309 6     Pain Loc --      Pain Edu? --      Excl. in GC? --     Constitutional: Alert and oriented. Well appearing and in no acute distress. Cardiovascular: Normal rate, regular rhythm. Grossly normal heart sounds.  Good peripheral circulation. Respiratory: Normal respiratory effort without tachypnea nor retractions. Breath sounds are clear and equal bilaterally. No wheezes, rales, rhonchi. Gastrointestinal: Soft and nontender.Normal Bowel sounds. No CVA tenderness. Pelvic: Exam completed  with Tamela Oddi RN at bedside as chaperone. External: Normal appearance, no rash or lesion.  Speculum: Mild amount of whitish vaginal discharge, no bleeding, cervical eyes closed, vaginal wall mild irritation.  Bimanual: Mild external vaginal tenderness, no induration.  No cervical tenderness to palpation.  No adnexal tenderness bilaterally. Musculoskeletal:  No midline cervical, thoracic or lumbar tenderness to palpation.  Neurologic:  Normal speech and language. No gross focal neurologic deficits are appreciated. Speech is normal. No gait instability.  Skin:  Skin is warm, dry and intact. No rash noted. Psychiatric: Mood and affect are normal. Speech and behavior are normal. Patient exhibits appropriate insight and judgment   ___________________________________________   LABS (all labs ordered are listed, but only abnormal results are displayed)  Labs Reviewed  WET PREP, GENITAL - Abnormal; Notable for the following components:      Result Value   Yeast Wet Prep HPF POC PRESENT (*)    WBC, Wet Prep HPF POC FEW (*)    All other components within normal limits  URINALYSIS, COMPLETE (UACMP) WITH MICROSCOPIC - Abnormal; Notable for the following components:   Color, Urine STRAW (*)    APPearance HAZY (*)    Squamous Epithelial / LPF TOO NUMEROUS TO COUNT (*)    Bacteria, UA MANY (*)    All other components within normal limits  URINE CULTURE  CHLAMYDIA/NGC RT PCR ([facility] ONLY)    PROCEDURES Procedures    INITIAL IMPRESSION / ASSESSMENT AND PLAN / ED COURSE  Pertinent labs & imaging results that were available during my care of the patient were reviewed by me and considered in my medical decision making (see chart for details).  Well-appearing patient.  No acute distress.  Requests trichomonas, gonorrhea and chlamydia STD testing, declines any other STD testing at this time.  Urinalysis reviewed, concern for UTI will culture urine and empirically start patient on oral Macrobid.   Pelvic exam completed.  Wet prep positive for yeast as well as urine positive for yeast.  Will treat with Diflucan.  Encourage continue pelvic rest.  Encourage follow-up for any continued complaints including follow-up at health department if needed.Discussed indication, risks and benefits of medications with patient.  Discussed follow up with Primary care physician this week. Discussed follow up and return parameters including no resolution or any worsening concerns. Patient verbalized understanding and agreed to plan.   ____________________________________________   FINAL CLINICAL IMPRESSION(S) / ED DIAGNOSES  Final diagnoses:  Urinary tract infection without hematuria, site unspecified  Yeast vaginitis  Screen for STD (sexually transmitted disease)     ED Discharge Orders        Ordered    nitrofurantoin, macrocrystal-monohydrate, (MACROBID) 100 MG capsule  2 times daily     06/18/17 1516    fluconazole (DIFLUCAN) 150 MG tablet  Daily     06/18/17 1516       Note: This dictation was prepared with Dragon dictation along with smaller  Lobbyist. Any transcriptional errors that result from this process are unintentional.         Renford Dills, NP 06/18/17 1658

## 2017-06-18 NOTE — ED Triage Notes (Addendum)
Per patient c/o lower pelvic pain/ lower / vaginal discharge and vaginal pain. Patient want to be re-test for STDs

## 2017-06-20 LAB — URINE CULTURE

## 2017-06-26 ENCOUNTER — Telehealth: Payer: Self-pay | Admitting: Emergency Medicine

## 2017-06-26 NOTE — Telephone Encounter (Signed)
Patient called stating that she was starting to have back pain again. Patient states she was treated for UTI on 06/18/17 and got better while on the antibiotic, but a day or 2 after she finished it she started having symptoms again. Advised patient that she would need to follow up for re-evaluation with PCP or Urgent Care. Patient voiced understanding.

## 2017-08-17 ENCOUNTER — Ambulatory Visit
Admission: EM | Admit: 2017-08-17 | Discharge: 2017-08-17 | Disposition: A | Discharge status: Home / Self Care | Payer: BLUE CROSS/BLUE SHIELD | Attending: Family Medicine | Admitting: Family Medicine

## 2017-08-17 ENCOUNTER — Other Ambulatory Visit: Payer: Self-pay

## 2017-08-17 DIAGNOSIS — H8110 Benign paroxysmal vertigo, unspecified ear: Secondary | ICD-10-CM

## 2017-08-17 MED ORDER — MECLIZINE HCL 25 MG PO TABS: 25.0000 mg | ORAL_TABLET | Freq: Three times a day (TID) | ORAL | 0 refills | Status: DC | PRN

## 2017-08-17 NOTE — ED Triage Notes (Signed)
Patient complains of dizzy spells that has been occurring since 1030am off and on. Patient states that she has noticed pressure at the base of her neck. Patient reports that she has had similar dizzy like spells before when she gets hot but never ones that have repeated throughout the day.

## 2017-08-17 NOTE — ED Provider Notes (Signed)
[facility]    CSN: [id number] Arrival date & time: 08/17/17  1757  History   Chief Complaint Chief Complaint  Patient presents with  . Dizziness   HPI  27 year old female presents with dizziness.  Patient reports that she has had ongoing dizziness recently.  Worsening today.  She states it is intermittent.  She states that she feels like she is spinning.  Last for seconds to minutes and resolves before recurring again later.  Worse with abrupt head movements and activity.  Improves with rest.  No associated nausea vomiting.  No other associated symptoms.  No other complaints or concerns at this time.  Past Medical History:  Diagnosis Date  . Asthma    Past Surgical History:  Procedure Laterality Date  . NO PAST SURGERIES      OB History   None    Family History Family History  Problem Relation Age of Onset  . Diabetes Mother   . Cancer Mother   . Diabetes Father   . Cancer Father     Social History Social History   Tobacco Use  . Smoking status: Current Some Day Smoker  . Smokeless tobacco: Never Used  Substance Use Topics  . Alcohol use: Yes    Comment: socially  . Drug use: No     Allergies   Benadryl allergy [diphenhydramine hcl]   Review of Systems Review of Systems  Constitutional: Negative.   Gastrointestinal: Negative.   Neurological: Positive for dizziness.   Physical Exam Triage Vital Signs ED Triage Vitals  Enc Vitals Group     BP 08/17/17 1823 138/84     Pulse Rate 08/17/17 1823 77     Resp 08/17/17 1823 18     Temp 08/17/17 1823 98.9 F (37.2 C)     Temp Source 08/17/17 1823 Oral     SpO2 08/17/17 1823 100 %     Weight 08/17/17 1821 260 lb (117.9 kg)     Height 08/17/17 1821  (1.803 m)     Head Circumference --      Peak Flow --      Pain Score 08/17/17 1821 0     Pain Loc --      Pain Edu? --      Excl. in GC? --    Updated Vital Signs BP 138/84 (BP Location: Left Arm)   Pulse 77   Temp 98.9 F (37.2  C) (Oral)   Resp 18   Ht  (1.803 m)   Wt 260 lb (117.9 kg)   LMP 07/29/2017   SpO2 100%   BMI 36.26 kg/m   Physical Exam  Constitutional: She is oriented to person, place, and time. She appears well-developed. No distress.  HENT:  Head: Normocephalic and atraumatic.  Nose: Nose normal.  Eyes: Pupils are equal, round, and reactive to light. Conjunctivae are normal.  Cardiovascular: Normal rate and regular rhythm.  Pulmonary/Chest: Effort normal and breath sounds normal.  Neurological: She is alert and oriented to person, place, and time.  No nystagmus noted with Dix-Hallpike.  It did replicate her symptoms.  Psychiatric: Her behavior is normal.  Flat affect.  Nursing note and vitals reviewed.  UC Treatments / Results  Labs (all labs ordered are listed, but only abnormal results are displayed) Labs Reviewed - No data to display  EKG None  Radiology No results found.  Procedures Procedures (including critical care time)  Medications Ordered in UC Medications - No data to display  Initial  Impression / Assessment and Plan / UC Course  I have reviewed the triage vital signs and the nursing notes.  Pertinent labs & imaging results that were available during my care of the patient were reviewed by me and considered in my medical decision making (see chart for details).    27 year old female presents with dizziness.  Appears to be consistent with BPPV.  Treated with meclizine.  Advised vestibular rehab if not improved.  Prescription given today.  Final Clinical Impressions(s) / UC Diagnoses   Final diagnoses:  Benign paroxysmal positional vertigo, unspecified laterality   Discharge Instructions   None    ED Prescriptions    Medication Sig Dispense Auth. Provider   meclizine (ANTIVERT) 25 MG tablet Take 1 tablet (25 mg total) by mouth 3 (three) times daily as needed for dizziness. 30 tablet Tommie Sams, DO     Controlled Substance Prescriptions [place]  Controlled Substance Registry consulted? Not Applicable   Tommie Sams, DO 08/17/17 1903

## 2017-08-29 DIAGNOSIS — Z9152 Personal history of nonsuicidal self-harm: Secondary | ICD-10-CM | POA: Insufficient documentation

## 2017-08-29 DIAGNOSIS — Z3009 Encounter for other general counseling and advice on contraception: Secondary | ICD-10-CM | POA: Diagnosis not present

## 2017-08-29 DIAGNOSIS — Z01419 Encounter for gynecological examination (general) (routine) without abnormal findings: Secondary | ICD-10-CM | POA: Diagnosis not present

## 2017-09-10 ENCOUNTER — Ambulatory Visit
Admission: EM | Admit: 2017-09-10 | Discharge: 2017-09-10 | Disposition: A | Discharge status: Home / Self Care | Payer: BLUE CROSS/BLUE SHIELD | Attending: Family Medicine | Admitting: Family Medicine

## 2017-09-10 ENCOUNTER — Encounter: Payer: Self-pay | Admitting: Gynecology

## 2017-09-10 DIAGNOSIS — J029 Acute pharyngitis, unspecified: Secondary | ICD-10-CM

## 2017-09-10 DIAGNOSIS — J302 Other seasonal allergic rhinitis: Secondary | ICD-10-CM | POA: Diagnosis not present

## 2017-09-10 DIAGNOSIS — R0981 Nasal congestion: Secondary | ICD-10-CM

## 2017-09-10 LAB — RAPID STREP SCREEN (MED CTR MEBANE ONLY): STREPTOCOCCUS, GROUP A SCREEN (DIRECT): NEGATIVE

## 2017-09-10 NOTE — ED Provider Notes (Signed)
[facility]    CSN: [id number] Arrival date & time: 09/10/17  1116     History   Chief Complaint Chief Complaint  Patient presents with  . Sore Throat    HPI Diane Williamson is a 27 y.o. female.   The history is provided by the patient.  URI  Presenting symptoms: congestion, rhinorrhea and sore throat   Severity:  Moderate Onset quality:  Sudden Duration:  1 week Timing:  Constant Progression:  Unchanged Chronicity:  New Relieved by:  None tried Associated symptoms: no wheezing   Risk factors: chronic respiratory disease (asthma)   Risk factors: not elderly, no chronic cardiac disease, no chronic kidney disease, no diabetes mellitus, no immunosuppression, no recent illness, no recent travel and no sick contacts     Past Medical History:  Diagnosis Date  . Asthma     There are no active problems to display for this patient.   Past Surgical History:  Procedure Laterality Date  . NO PAST SURGERIES      OB History   None      Home Medications    Prior to Admission medications   Medication Sig Start Date End Date Taking? Authorizing Provider  albuterol (PROVENTIL HFA;VENTOLIN HFA) 108 (90 Base) MCG/ACT inhaler Inhale 2 puffs into the lungs every 6 (six) hours as needed for wheezing or shortness of breath. 05/17/15  Yes Beers, Charmayne Sheer, PA-C  meclizine (ANTIVERT) 25 MG tablet Take 1 tablet (25 mg total) by mouth 3 (three) times daily as needed for dizziness. 08/17/17  Yes Tommie Sams, DO    Family History Family History  Problem Relation Age of Onset  . Diabetes Mother   . Cancer Mother   . Diabetes Father   . Cancer Father     Social History Social History   Tobacco Use  . Smoking status: Current Some Day Smoker  . Smokeless tobacco: Never Used  Substance Use Topics  . Alcohol use: Yes    Comment: socially  . Drug use: No     Allergies   Benadryl allergy [diphenhydramine hcl]   Review of Systems Review of Systems  HENT:  Positive for congestion, rhinorrhea and sore throat.   Respiratory: Negative for wheezing.      Physical Exam Triage Vital Signs ED Triage Vitals  Enc Vitals Group     BP 09/10/17 1143 (!) 132/24     Pulse Rate 09/10/17 1143 75     Resp 09/10/17 1143 16     Temp 09/10/17 1143 98.8 F (37.1 C)     Temp Source 09/10/17 1143 Oral     SpO2 09/10/17 1143 100 %     Weight 09/10/17 1144 260 lb (117.9 kg)     Height --      Head Circumference --      Peak Flow --      Pain Score 09/10/17 1143 9     Pain Loc --      Pain Edu? --      Excl. in GC? --    No data found.  Updated Vital Signs BP (!) 132/24 (BP Location: Left Arm)   Pulse 75   Temp 98.8 F (37.1 C) (Oral)   Resp 16   Wt 260 lb (117.9 kg)   LMP 08/28/2017   SpO2 100%   BMI 36.26 kg/m   Visual Acuity Right Eye Distance:   Left Eye Distance:   Bilateral Distance:    Right Eye Near:  Left Eye Near:    Bilateral Near:     Physical Exam  Constitutional: She appears well-developed and well-nourished. No distress.  HENT:  Head: Normocephalic and atraumatic.  Right Ear: Tympanic membrane, external ear and ear canal normal.  Left Ear: Tympanic membrane, external ear and ear canal normal.  Nose: Rhinorrhea present. No mucosal edema, nose lacerations, sinus tenderness, nasal deformity, septal deviation or nasal septal hematoma. No epistaxis.  No foreign bodies. Right sinus exhibits no maxillary sinus tenderness and no frontal sinus tenderness. Left sinus exhibits no maxillary sinus tenderness and no frontal sinus tenderness.  Mouth/Throat: Uvula is midline and mucous membranes are normal. Posterior oropharyngeal erythema present. No oropharyngeal exudate, posterior oropharyngeal edema or tonsillar abscesses. No tonsillar exudate.  Eyes: Conjunctivae are normal. Right eye exhibits no discharge. Left eye exhibits no discharge. No scleral icterus.  Neck: Normal range of motion. Neck supple. No thyromegaly present.    Cardiovascular: Normal rate, regular rhythm and normal heart sounds.  Pulmonary/Chest: Effort normal and breath sounds normal. No respiratory distress. She has no wheezes. She has no rales.  Lymphadenopathy:    She has no cervical adenopathy.  Skin: She is not diaphoretic.  Nursing note and vitals reviewed.    UC Treatments / Results  Labs (all labs ordered are listed, but only abnormal results are displayed) Labs Reviewed  RAPID STREP SCREEN ([facility] ONLY)  CULTURE, GROUP A STREP [facility])    EKG None  Radiology No results found.  Procedures Procedures (including critical care time)  Medications Ordered in UC Medications - No data to display  Initial Impression / Assessment and Plan / UC Course  I have reviewed the triage vital signs and the nursing notes.  Pertinent labs & imaging results that were available during my care of the patient were reviewed by me and considered in my medical decision making (see chart for details).     Final Clinical Impressions(s) / UC Diagnoses   Final diagnoses:  Sore throat  Seasonal allergic rhinitis, unspecified trigger     Discharge Instructions     Claritin-D, or Zyrtec-D or Allegra-D daily Flonase nasal spray     ED Prescriptions    None     1. Lab results and diagnosis reviewed with patient 2. Recommend supportive treatment as above  3. Follow-up prn if symptoms worsen or don't improve    Controlled Substance Prescriptions [place] Controlled Substance Registry consulted? Not Applicable   Payton Mccallum, MD 09/10/17 [id number]

## 2017-09-10 NOTE — ED Triage Notes (Signed)
Patient c/o sore throat x 1 week. 

## 2017-09-10 NOTE — Discharge Instructions (Signed)
Claritin-D, or Zyrtec-D or Allegra-D daily Flonase nasal spray

## 2017-09-13 LAB — CULTURE, GROUP A STREP (THRC)

## 2017-09-26 ENCOUNTER — Emergency Department
Admission: EM | Admit: 2017-09-26 | Discharge: 2017-09-26 | Disposition: A | Discharge status: Home / Self Care | Payer: BLUE CROSS/BLUE SHIELD | Attending: Emergency Medicine | Admitting: Emergency Medicine

## 2017-09-26 ENCOUNTER — Other Ambulatory Visit: Payer: Self-pay

## 2017-09-26 ENCOUNTER — Encounter: Payer: Self-pay | Admitting: Emergency Medicine

## 2017-09-26 DIAGNOSIS — R309 Painful micturition, unspecified: Secondary | ICD-10-CM | POA: Diagnosis not present

## 2017-09-26 DIAGNOSIS — R300 Dysuria: Secondary | ICD-10-CM | POA: Diagnosis not present

## 2017-09-26 DIAGNOSIS — F17200 Nicotine dependence, unspecified, uncomplicated: Secondary | ICD-10-CM | POA: Diagnosis not present

## 2017-09-26 DIAGNOSIS — J45909 Unspecified asthma, uncomplicated: Secondary | ICD-10-CM | POA: Insufficient documentation

## 2017-09-26 DIAGNOSIS — N3000 Acute cystitis without hematuria: Secondary | ICD-10-CM | POA: Diagnosis not present

## 2017-09-26 HISTORY — DX: Cystitis, unspecified without hematuria: N30.90

## 2017-09-26 LAB — BASIC METABOLIC PANEL
ANION GAP: 9 (ref 5–15)
BUN: 10 mg/dL (ref 6–20)
CO2: 24 mmol/L (ref 22–32)
Calcium: 8.9 mg/dL (ref 8.9–10.3)
Chloride: 104 mmol/L (ref 101–111)
Creatinine, Ser: 0.6 mg/dL (ref 0.44–1.00)
GFR calc Af Amer: >60 mL/min (ref >60)
Glucose, Bld: 106 mg/dL — ABNORMAL HIGH (ref 65–99)
POTASSIUM: 3.6 mmol/L (ref 3.5–5.1)
SODIUM: 137 mmol/L (ref 135–145)

## 2017-09-26 LAB — URINALYSIS, COMPLETE (UACMP) WITH MICROSCOPIC
BACTERIA UA: NONE SEEN
BILIRUBIN URINE: NEGATIVE
Glucose, UA: NEGATIVE mg/dL
KETONES UR: NEGATIVE mg/dL
Nitrite: POSITIVE — AB
PROTEIN: NEGATIVE mg/dL
Specific Gravity, Urine: 1.025 (ref 1.005–1.030)
pH: 7 (ref 5.0–8.0)

## 2017-09-26 LAB — CBC
HCT: 33.9 % — ABNORMAL LOW (ref 35.0–47.0)
Hemoglobin: 11.5 g/dL — ABNORMAL LOW (ref 12.0–16.0)
MCH: 26.7 pg (ref 26.0–34.0)
MCHC: 34 g/dL (ref 32.0–36.0)
MCV: 78.5 fL — ABNORMAL LOW (ref 80.0–100.0)
PLATELETS: 308 10*3/uL (ref 150–440)
RBC: 4.31 MIL/uL (ref 3.80–5.20)
RDW: 17.8 % — ABNORMAL HIGH (ref 11.5–14.5)
WBC: 7.7 10*3/uL (ref 3.6–11.0)

## 2017-09-26 LAB — POCT PREGNANCY, URINE: PREG TEST UR: NEGATIVE

## 2017-09-26 LAB — CHLAMYDIA/NGC RT PCR (ARMC ONLY)
CHLAMYDIA TR: NOT DETECTED
N GONORRHOEAE: NOT DETECTED

## 2017-09-26 MED ORDER — CEPHALEXIN 500 MG PO CAPS
ORAL_CAPSULE | ORAL | Status: AC
  Administered 2017-09-26: 500 mg via ORAL
  Filled 2017-09-26: qty 1

## 2017-09-26 MED ORDER — CEPHALEXIN 500 MG PO CAPS
500.0000 mg | ORAL_CAPSULE | Freq: Once | ORAL | Status: AC
  Administered 2017-09-26: 500 mg via ORAL

## 2017-09-26 MED ORDER — CEPHALEXIN 500 MG PO CAPS: 500.0000 mg | ORAL_CAPSULE | Freq: Two times a day (BID) | ORAL | 0 refills | Status: DC

## 2017-09-26 NOTE — ED Provider Notes (Signed)
[facility] Emergency Department Provider Note  ____________________________________________   First MD Initiated Contact with Patient 09/26/17 1743     (approximate)  I have reviewed the triage vital signs and the nursing notes.   HISTORY  Chief Complaint Dysuria and Back Pain    HPI Diane Williamson is a 27 y.o. female who presents for evaluation of acute onset dysuria that started this morning.  She reports severe burning pain when she urinates.  This is similar to pain she had about a month ago when she was told she had a urinary tract infection.  The pain radiates into her lower back but resolves when she is not urinating.  Over-the-counter Azo pills are not helping.  She has no vaginal symptoms or complaints including no vaginal bleeding or excessive vaginal discharge.  She does report that she found out 4 months ago that her husband was cheating on her and she was diagnosed with gonorrhea and trichomonas and had treatment for both, but she does hAve a new sexual partner with him she does not always use condoms.   she denies fever/chills, chest pain, shortness of breath, nausea, vomiting, and diarrhea.      Past Medical History:  Diagnosis Date  . Asthma   . Cystitis     There are no active problems to display for this patient.   Past Surgical History:  Procedure Laterality Date  . NO PAST SURGERIES      Prior to Admission medications   Medication Sig Start Date End Date Taking? Authorizing Provider  albuterol (PROVENTIL HFA;VENTOLIN HFA) 108 (90 Base) MCG/ACT inhaler Inhale 2 puffs into the lungs every 6 (six) hours as needed for wheezing or shortness of breath. 05/17/15   Beers, Charmayne Sheerharles M, PA-C  cephALEXin (KEFLEX) 500 MG capsule Take 1 capsule (500 mg total) by mouth 2 (two) times daily. 09/26/17   Loleta RoseForbach, Latisa Belay, MD  meclizine (ANTIVERT) 25 MG tablet Take 1 tablet (25 mg total) by mouth 3 (three) times daily as needed for dizziness. 08/17/17    Tommie Samsook, Jayce G, DO    Allergies Benadryl allergy [diphenhydramine hcl]  Family History  Problem Relation Age of Onset  . Diabetes Mother   . Cancer Mother   . Diabetes Father   . Cancer Father     Social History Social History   Tobacco Use  . Smoking status: Current Some Day Smoker  . Smokeless tobacco: Never Used  Substance Use Topics  . Alcohol use: Yes    Comment: socially  . Drug use: No    Review of Systems Constitutional: No fever/chills Eyes: No visual changes. ENT: No sore throat. Cardiovascular: Denies chest pain. Respiratory: Denies shortness of breath. Gastrointestinal: No abdominal pain.  No nausea, no vomiting.  No diarrhea.  No constipation. Genitourinary: +dysuria. Musculoskeletal: Negative for neck pain.  The dysuria radiates to her lower back Integumentary: Negative for rash. Neurological: Negative for headaches, focal weakness or numbness.   ____________________________________________   PHYSICAL EXAM:  VITAL SIGNS: ED Triage Vitals  Enc Vitals Group     BP 09/26/17 1548 135/85     Pulse Rate 09/26/17 1548 93     Resp 09/26/17 1548 16     Temp 09/26/17 1548 97.8 F (36.6 C)     Temp Source 09/26/17 1548 Oral     SpO2 09/26/17 1548 100 %     Weight 09/26/17 1552 113.4 kg (250 lb)     Height 09/26/17 1552 1.778 m (5\' 10" )  Head Circumference --      Peak Flow --      Pain Score 09/26/17 1552 6     Pain Loc --      Pain Edu? --      Excl. in GC? --     Constitutional: Alert and oriented. Well appearing and in no acute distress. Eyes: Conjunctivae are normal.  Head: Atraumatic. Nose: No congestion/rhinnorhea. Neck: No stridor.  No meningeal signs.   Cardiovascular: Normal rate, regular rhythm. Good peripheral circulation. Grossly normal heart sounds. Respiratory: Normal respiratory effort.  No retractions. Lungs CTAB. Gastrointestinal: Soft and nontender. No distention.  Genitourinary: Deferred due to patient  preference Musculoskeletal: No lower extremity tenderness nor edema. No gross deformities of extremities. Neurologic:  Normal speech and language. No gross focal neurologic deficits are appreciated.  Skin:  Skin is warm, dry and intact. No rash noted. Psychiatric: Mood and affect are normal. Speech and behavior are normal.  ____________________________________________   LABS (all labs ordered are listed, but only abnormal results are displayed)  Labs Reviewed  URINALYSIS, COMPLETE (UACMP) WITH MICROSCOPIC - Abnormal; Notable for the following components:      Result Value   Color, Urine AMBER (*)    APPearance CLEAR (*)    Hgb urine dipstick MODERATE (*)    Nitrite POSITIVE (*)    Leukocytes, UA TRACE (*)    All other components within normal limits  CBC - Abnormal; Notable for the following components:   Hemoglobin 11.5 (*)    HCT 33.9 (*)    MCV 78.5 (*)    RDW 17.8 (*)    All other components within normal limits  BASIC METABOLIC PANEL - Abnormal; Notable for the following components:   Glucose, Bld 106 (*)    All other components within normal limits  CHLAMYDIA/NGC RT PCR ([facility] ONLY)  URINE CULTURE  POC URINE PREG, ED  POCT PREGNANCY, URINE   ____________________________________________  EKG  None - EKG not ordered by ED physician ____________________________________________  RADIOLOGY   ED MD interpretation:  No indication for imaging  Official radiology report(s): No results found.  ____________________________________________   PROCEDURES  Critical Care performed: No   Procedure(s) performed:   Procedures   ____________________________________________   INITIAL IMPRESSION / ASSESSMENT AND PLAN / ED COURSE  As part of my medical decision making, I reviewed the following data within the electronic MEDICAL RECORD NUMBER Nursing notes reviewed and incorporated, Labs reviewed , Old chart reviewed and Notes from prior ED visits     Differential diagnosis includes, but is not limited to, UTI, pyelonephritis, STD/PID.  Her urinalysis is positive including nitrite positive.  She has a normal CBC, metabolic panel, and a negative urine pregnancy test.  However it is also possible she may have an STD given that she has a new partner with whom she does not always use condoms.  She does not want to have a pelvic exam but also would like to know if she has gonorrhea or chlamydia again.  I also verified with her that she provided a "dirty urine" specimen.  I called the lab and asked them to add on chlamydia/gonorrhea to the specimen that they have and they are checking into it.  There is no indication she requires any imaging at this time.  Clinical Course as of Sep 27 2023  Tue Sep 26, 2017  2022 Negative GC/chlamydia.  I informed the patient.  She is comfortable with plan for discharge and outpatient follow-up.  First dose  of Keflex 500 mg given tonight since the pharmacy is closed.  Chlamydia/NGC rt PCR ([facility] only) [CF]    Clinical Course User Index [CF] Loleta Rose, MD    ____________________________________________  FINAL CLINICAL IMPRESSION(S) / ED DIAGNOSES  Final diagnoses:  Acute cystitis without hematuria     MEDICATIONS GIVEN DURING THIS VISIT:  Medications  cephALEXin (KEFLEX) capsule 500 mg (500 mg Oral Given 09/26/17 2007)     ED Discharge Orders        Ordered    cephALEXin (KEFLEX) 500 MG capsule  2 times daily     09/26/17 2023       Note:  This document was prepared using Dragon voice recognition software and may include unintentional dictation errors.    Loleta Rose, MD 09/26/17 2025

## 2017-09-26 NOTE — ED Notes (Signed)
Pt reports dysuria since that began this morning. Pt states she had dysuria with urgency about a month ago. Denies any frequency or urgency at this time.  Also c/o lower back pain. Has tried AZO pills OTC with no relief. Pt denies any vaginal discharge at this time.  Has hx of cystitis.

## 2017-09-26 NOTE — Discharge Instructions (Signed)

## 2017-09-26 NOTE — ED Triage Notes (Addendum)
Pt c/o dysuria since this morning. Pt states she had dysuria with urgency about a month ago. Denies any frequency or urgency at this time.  Also c/o low back pain   Pt states she tried AZO pills OTC with no relief. Pt denies any vaginal discharge at this time.  Has hx of cystitis.

## 2017-09-27 LAB — URINE CULTURE
Culture: NO GROWTH
Special Requests: NORMAL

## 2017-09-29 ENCOUNTER — Other Ambulatory Visit: Payer: Self-pay

## 2017-09-29 ENCOUNTER — Encounter: Payer: Self-pay | Admitting: Emergency Medicine

## 2017-09-29 ENCOUNTER — Emergency Department
Admission: EM | Admit: 2017-09-29 | Discharge: 2017-09-29 | Disposition: A | Discharge status: Home / Self Care | Payer: BLUE CROSS/BLUE SHIELD | Attending: Emergency Medicine | Admitting: Emergency Medicine

## 2017-09-29 DIAGNOSIS — Z79899 Other long term (current) drug therapy: Secondary | ICD-10-CM | POA: Insufficient documentation

## 2017-09-29 DIAGNOSIS — R300 Dysuria: Secondary | ICD-10-CM | POA: Diagnosis not present

## 2017-09-29 DIAGNOSIS — N3000 Acute cystitis without hematuria: Secondary | ICD-10-CM | POA: Diagnosis not present

## 2017-09-29 DIAGNOSIS — N390 Urinary tract infection, site not specified: Secondary | ICD-10-CM | POA: Diagnosis not present

## 2017-09-29 DIAGNOSIS — F17200 Nicotine dependence, unspecified, uncomplicated: Secondary | ICD-10-CM | POA: Insufficient documentation

## 2017-09-29 DIAGNOSIS — J45909 Unspecified asthma, uncomplicated: Secondary | ICD-10-CM | POA: Diagnosis not present

## 2017-09-29 LAB — URINALYSIS, COMPLETE (UACMP) WITH MICROSCOPIC
Bilirubin Urine: NEGATIVE
Glucose, UA: NEGATIVE mg/dL
Ketones, ur: NEGATIVE mg/dL
Nitrite: POSITIVE — AB
PH: 6 (ref 5.0–8.0)
Protein, ur: NEGATIVE mg/dL
SPECIFIC GRAVITY, URINE: 1.002 — AB (ref 1.005–1.030)

## 2017-09-29 LAB — PREGNANCY, URINE: Preg Test, Ur: NEGATIVE

## 2017-09-29 MED ORDER — NITROFURANTOIN MONOHYD MACRO 100 MG PO CAPS
100.0000 mg | ORAL_CAPSULE | Freq: Once | ORAL | Status: AC
  Administered 2017-09-29: 100 mg via ORAL
  Filled 2017-09-29: qty 1

## 2017-09-29 MED ORDER — NITROFURANTOIN MONOHYD MACRO 100 MG PO CAPS: 100.0000 mg | ORAL_CAPSULE | Freq: Two times a day (BID) | ORAL | 0 refills | Status: AC

## 2017-09-29 MED ORDER — PHENAZOPYRIDINE HCL 100 MG PO TABS: 100.0000 mg | ORAL_TABLET | Freq: Three times a day (TID) | ORAL | 0 refills | Status: DC | PRN

## 2017-09-29 NOTE — ED Provider Notes (Signed)
[facility] Emergency Department Provider Note ____________________________________________   First MD Initiated Contact with Patient 09/29/17 [phone]     (approximate)  I have reviewed the triage vital signs and the nursing notes.   HISTORY  Chief Complaint Urinary Tract Infection  HPI Diane Williamson is a 27 y.o. female a history of multiple UTIs who is presenting to the emergency department with persistent burning with urination and lower abdominal cramping despite being on Keflex for the past 3 to 4 days.  She was evaluated in the emergency department several days ago when she also had a pelvic exam.  She was found to have a negative pregnancy test.  Negative gonorrhea and chlamydia.  Denies any vaginal discharge at this time.  Denies any radiation of the pain through to her back.  Past Medical History:  Diagnosis Date  . Asthma   . Cystitis     There are no active problems to display for this patient.   Past Surgical History:  Procedure Laterality Date  . NO PAST SURGERIES      Prior to Admission medications   Medication Sig Start Date End Date Taking? Authorizing Provider  albuterol (PROVENTIL HFA;VENTOLIN HFA) 108 (90 Base) MCG/ACT inhaler Inhale 2 puffs into the lungs every 6 (six) hours as needed for wheezing or shortness of breath. 05/17/15   Beers, Charmayne Sheer, PA-C  cephALEXin (KEFLEX) 500 MG capsule Take 1 capsule (500 mg total) by mouth 2 (two) times daily. 09/26/17   Loleta Rose, MD  meclizine (ANTIVERT) 25 MG tablet Take 1 tablet (25 mg total) by mouth 3 (three) times daily as needed for dizziness. 08/17/17   Tommie Sams, DO    Allergies Benadryl allergy [diphenhydramine hcl]  Family History  Problem Relation Age of Onset  . Diabetes Mother   . Cancer Mother   . Diabetes Father   . Cancer Father     Social History Social History   Tobacco Use  . Smoking status: Current Some Day Smoker  . Smokeless tobacco: Never Used    Substance Use Topics  . Alcohol use: Yes    Comment: socially  . Drug use: No    Review of Systems  Constitutional: No fever/chills Eyes: No visual changes. ENT: No sore throat. Cardiovascular: Denies chest pain. Respiratory: Denies shortness of breath. Gastrointestinal:  No nausea, no vomiting.  No diarrhea.  No constipation. Genitourinary: As above Musculoskeletal: Negative for back pain. Skin: Negative for rash. Neurological: Negative for headaches, focal weakness or numbness.   ____________________________________________   PHYSICAL EXAM:  VITAL SIGNS: ED Triage Vitals  Enc Vitals Group     BP 09/29/17 1702 119/69     Pulse Rate 09/29/17 1702 78     Resp 09/29/17 1702 16     Temp 09/29/17 1702 99.1 F (37.3 C)     Temp Source 09/29/17 1702 Oral     SpO2 09/29/17 1702 100 %     Weight 09/29/17 1703 250 lb (113.4 kg)     Height 09/29/17 1703 5\' 10"  (1.778 m)     Head Circumference --      Peak Flow --      Pain Score 09/29/17 1703 6     Pain Loc --      Pain Edu? --      Excl. in GC? --     Constitutional: Alert and oriented. Well appearing and in no acute distress. Eyes: Conjunctivae are normal.  Head: Atraumatic. Nose: No congestion/rhinnorhea. Mouth/Throat:  Mucous membranes are moist.  Neck: No stridor.   Cardiovascular: Normal rate, regular rhythm. Grossly normal heart sounds.  Respiratory: Normal respiratory effort.  No retractions. Lungs CTAB. Gastrointestinal: Soft and nontender. No distention. No CVA tenderness. Musculoskeletal: No lower extremity tenderness nor edema.  No joint effusions. Neurologic:  Normal speech and language. No gross focal neurologic deficits are appreciated. Skin:  Skin is warm, dry and intact. No rash noted. Psychiatric: Mood and affect are normal. Speech and behavior are normal.  ____________________________________________   LABS (all labs ordered are listed, but only abnormal results are displayed)  Labs Reviewed   URINALYSIS, COMPLETE (UACMP) WITH MICROSCOPIC - Abnormal; Notable for the following components:      Result Value   Color, Urine AMBER (*)    APPearance CLEAR (*)    Specific Gravity, Urine 1.002 (*)    Hgb urine dipstick SMALL (*)    Nitrite POSITIVE (*)    Leukocytes, UA MODERATE (*)    Bacteria, UA RARE (*)    All other components within normal limits  URINE CULTURE  PREGNANCY, URINE   ____________________________________________  EKG   ____________________________________________  RADIOLOGY   ____________________________________________   PROCEDURES  Procedure(s) performed:   Procedures  Critical Care performed:   ____________________________________________   INITIAL IMPRESSION / ASSESSMENT AND PLAN / ED COURSE  Pertinent labs & imaging results that were available during my care of the patient were reviewed by me and considered in my medical decision making (see chart for details).  Differential diagnosis includes, but is not limited to, ovarian cyst, ovarian torsion, acute appendicitis, diverticulitis, urinary tract infection/pyelonephritis, endometriosis, bowel obstruction, colitis, renal colic, gastroenteritis, hernia, fibroids, endometriosis, pregnancy related pain including ectopic pregnancy, etc. As part of my medical decision making, I reviewed the following data within the electronic MEDICAL RECORD NUMBER Notes from prior ED visits  Patient will stop her Keflex and will be changed to Macrobid along with Pyridium.  She will be following up with urology.  Culture results reviewed from several days ago without any growth but urine continues to be nitrite positive with white blood cells.  Patient nontoxic in appearance.  Normal vital signs.  Does not have clinical signs of pyelonephritis. ____________________________________________   FINAL CLINICAL IMPRESSION(S) / ED DIAGNOSES  Cystitis    NEW MEDICATIONS STARTED DURING THIS VISIT:  New Prescriptions    No medications on file     Note:  This document was prepared using Dragon voice recognition software and may include unintentional dictation errors.     Myrna BlazerSchaevitz, David Matthew, MD 09/29/17 [phone]

## 2017-09-29 NOTE — ED Triage Notes (Signed)
Pt to ED via POV, pt states that she was seen on Tuesday and diagnosed with UTI, pt states that her symptom have not gotten any better. Pt states that she is taking antibiotics and AZO but is not getting any relief. Pt is in NAD at this time.

## 2017-10-01 LAB — URINE CULTURE

## 2017-10-10 DIAGNOSIS — F4323 Adjustment disorder with mixed anxiety and depressed mood: Secondary | ICD-10-CM | POA: Insufficient documentation

## 2018-06-07 ENCOUNTER — Ambulatory Visit (INDEPENDENT_AMBULATORY_CARE_PROVIDER_SITE_OTHER): Payer: BLUE CROSS/BLUE SHIELD | Admitting: Family Medicine

## 2018-06-07 ENCOUNTER — Encounter: Payer: Self-pay | Admitting: Family Medicine

## 2018-06-07 DIAGNOSIS — R3129 Other microscopic hematuria: Secondary | ICD-10-CM | POA: Insufficient documentation

## 2018-06-07 DIAGNOSIS — D649 Anemia, unspecified: Secondary | ICD-10-CM | POA: Insufficient documentation

## 2018-06-07 DIAGNOSIS — R5383 Other fatigue: Secondary | ICD-10-CM | POA: Diagnosis not present

## 2018-06-07 DIAGNOSIS — E669 Obesity, unspecified: Secondary | ICD-10-CM | POA: Diagnosis not present

## 2018-06-07 DIAGNOSIS — Z23 Encounter for immunization: Secondary | ICD-10-CM | POA: Diagnosis not present

## 2018-06-07 DIAGNOSIS — J45998 Other asthma: Secondary | ICD-10-CM | POA: Insufficient documentation

## 2018-06-07 DIAGNOSIS — F900 Attention-deficit hyperactivity disorder, predominantly inattentive type: Secondary | ICD-10-CM | POA: Insufficient documentation

## 2018-06-07 DIAGNOSIS — K5900 Constipation, unspecified: Secondary | ICD-10-CM

## 2018-06-07 DIAGNOSIS — I8393 Asymptomatic varicose veins of bilateral lower extremities: Secondary | ICD-10-CM

## 2018-06-07 DIAGNOSIS — I83813 Varicose veins of bilateral lower extremities with pain: Secondary | ICD-10-CM | POA: Insufficient documentation

## 2018-06-07 DIAGNOSIS — Z0000 Encounter for general adult medical examination without abnormal findings: Secondary | ICD-10-CM

## 2018-06-07 MED ORDER — MONTELUKAST SODIUM 10 MG PO TABS: 10.0000 mg | ORAL_TABLET | Freq: Every day | ORAL | 3 refills | Status: DC

## 2018-06-07 MED ORDER — FLUTICASONE PROPIONATE HFA 220 MCG/ACT IN AERO: 2.0000 | INHALATION_SPRAY | Freq: Two times a day (BID) | RESPIRATORY_TRACT | 0 refills | Status: DC

## 2018-06-07 MED ORDER — ACYCLOVIR 800 MG PO TABS: 800.0000 mg | ORAL_TABLET | Freq: Three times a day (TID) | ORAL | 5 refills | Status: DC

## 2018-06-07 MED ORDER — EPINEPHRINE 0.3 MG/0.3ML IJ SOAJ: 0.3000 mg | INTRAMUSCULAR | 1 refills | Status: DC | PRN

## 2018-06-07 MED ORDER — ALBUTEROL SULFATE HFA 108 (90 BASE) MCG/ACT IN AERS: 2.0000 | INHALATION_SPRAY | RESPIRATORY_TRACT | 1 refills | Status: DC | PRN

## 2018-06-07 NOTE — Assessment & Plan Note (Addendum)
Chronic issue, despite oral iron intake; check urine for blood, check CBC, ferritin, iron, TIBC

## 2018-06-07 NOTE — Assessment & Plan Note (Signed)
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

## 2018-06-07 NOTE — Progress Notes (Signed)
BP 120/68   Pulse 80   Temp 98.7 F (37.1 C) (Oral)   Resp 12   Ht 5\' 10"  (1.778 m)   Wt 253 lb (114.8 kg)   LMP 05/14/2018   SpO2 99%   BMI 36.30 kg/m    Subjective:    Patient ID: Diane Williamson, female    DOB: 11-Jul-1990, 28 y.o.   MRN: [id number]  HPI: Diane AlaRachel E Issac is a 28 y.o. female  Chief Complaint  Patient presents with  . Establish Care  . Asthma  . Varicose Veins  . ADD    HPI Patient is here to establish care Asthma; used SABA 2-3 x a day in high school; was on long-term controller in [place]; Advair was amazing but made her having intense itching; now she is on Symbicort; then started to get headaches and they are intense; "my asthma is definitely worse"; she has not been able to catch her breath the last few weeks; no hx of blood clot; no family hx of hypercoagulable states or PE; cold weather is a trigger; intense laughter is a trigger; exercise is a trigger No hospitalizations but has had to go to the ER for nebs Using SABA and it works but does not last Stopped the Symbicort because of headaches Breo made her itch as well Hypoallergenic dog; dog does not get on the bed; did have bad reaction once  She has seasonal allergies and animal allergies; that can trigger the asthma  ADD; has had it for as long as she can remember; always was on medicine for that; Ritalin at first, then Concerta; then another medicine and it didn't work; Concerta helped the most; fluttery heart sometimes, just once in a while, random, not associated with exercise  Hx of anemia; no blood in the urine; no blood in the stools; periods are 5 days, on Nuvaring which helps a lot; cut down on bleeding "immensely"  Positive family hx of thyroid disease; weight is stable; energy level is down; more constipated  Genital herpes; fairly new; hurts so bad; HIV negative; stressors and periods bring these on  Recurrent urinary tract infections, some blood noted on those dips; reviewed last 5  urines; no hx of kidney stones  Does not want flu shot (gets sick)  Weighed 260 pounds, went vegan and tried to save the animals; lost a bunch of weight, but then regained, lost 100 pounds in 6 months  Varicose veins, wearing compression socks; chronic problem  Depression screen PHQ 2/9 06/07/2018  Decreased Interest 0  Down, Depressed, Hopeless 1  PHQ - 2 Score 1  Altered sleeping 3  Tired, decreased energy 0  Change in appetite 1  Feeling bad or failure about yourself  3  Trouble concentrating 3  Moving slowly or fidgety/restless 0  Suicidal thoughts 0  PHQ-9 Score 11  Difficult doing work/chores Somewhat difficult   Fall Risk  06/07/2018  Falls in the past year? 0  Number falls in past yr: 0  Injury with Fall? 0    Relevant past medical, surgical, family and social history reviewed Past Medical History:  Diagnosis Date  . ADD (attention deficit disorder)   . Asthma   . Cystitis   . Genital herpes    Past Surgical History:  Procedure Laterality Date  . NO PAST SURGERIES     Family History  Problem Relation Age of Onset  . ADD / ADHD Father   . Cancer Maternal Grandmother   . Heart disease  Maternal Grandmother   . Diabetes Maternal Grandfather   . Diabetes Paternal Grandmother   . Atrial fibrillation Paternal Grandmother   . AAA (abdominal aortic aneurysm) Paternal Grandmother   . Heart attack Paternal Grandfather   . Varicose Veins Paternal Grandfather    Social History   Tobacco Use  . Smoking status: Former Smoker    Last attempt to quit: 01/16/2018    Years since quitting: 0.3  . Smokeless tobacco: Never Used  Substance Use Topics  . Alcohol use: Not Currently  . Drug use: No     [facility]  AUDIT-C Score  0      Interim medical history since last visit reviewed. Allergies and medications reviewed  Review of Systems Per HPI unless specifically indicated above     Objective:    BP 120/68    Pulse 80   Temp 98.7 F (37.1 C) (Oral)   Resp 12   Ht 5\' 10"  (1.778 m)   Wt 253 lb (114.8 kg)   LMP 05/14/2018   SpO2 99%   BMI 36.30 kg/m   Wt Readings from Last 3 Encounters:  06/07/18 253 lb (114.8 kg)  09/29/17 250 lb (113.4 kg)  09/26/17 250 lb (113.4 kg)    Physical Exam Constitutional:      General: She is not in acute distress.    Appearance: She is well-developed. She is not diaphoretic.  HENT:     Head: Normocephalic and atraumatic.     Right Ear: Tympanic membrane and ear canal normal.     Left Ear: Tympanic membrane and ear canal normal.     Mouth/Throat:     Mouth: Mucous membranes are moist.  Eyes:     General: No scleral icterus. Neck:     Thyroid: No thyromegaly.  Cardiovascular:     Rate and Rhythm: Normal rate and regular rhythm.     Heart sounds: Normal heart sounds. No murmur.  Pulmonary:     Effort: Pulmonary effort is normal. No respiratory distress.     Breath sounds: Normal breath sounds. No wheezing.  Abdominal:     General: Bowel sounds are normal. There is no distension.     Palpations: Abdomen is soft.  Skin:    General: Skin is warm and dry.     Coloration: Skin is not pale.     Comments: Large ropey varicose veins, right thigh and knee and calf >>> left  Neurological:     Mental Status: She is alert.  Psychiatric:        Mood and Affect: Mood is not anxious or depressed. Affect is not labile.        Speech: Speech is not rapid and pressured.        Behavior: Behavior normal. Behavior is not slowed, aggressive, withdrawn or hyperactive.        Thought Content: Thought content normal.        Cognition and Memory: She does not exhibit impaired recent memory or impaired remote memory.        Judgment: Judgment normal.     Results for orders placed or performed during the hospital encounter of 09/29/17  Urine Culture  Result Value Ref Range   Specimen Description      URINE, RANDOM Performed at [facility], [address] [place], [place] [address]    Special Requests      NONE Performed at [facility], [address]  [place], [place] [address]    Culture (A)     <10,000 COLONIES/mL INSIGNIFICANT GROWTH Performed at [facility], [address], [place], [place] [address]    Report Status 10/01/2017 FINAL   Urinalysis, Complete w Microscopic  Result Value Ref Range   Color, Urine AMBER (A) YELLOW   APPearance CLEAR (A) CLEAR   Specific Gravity, Urine 1.002 (L) 1.005 - 1.030   pH 6.0 5.0 - 8.0   Glucose, UA NEGATIVE NEGATIVE mg/dL   Hgb urine dipstick SMALL (A) NEGATIVE   Bilirubin Urine NEGATIVE NEGATIVE   Ketones, ur NEGATIVE NEGATIVE mg/dL   Protein, ur NEGATIVE NEGATIVE mg/dL   Nitrite POSITIVE (A) NEGATIVE   Leukocytes, UA MODERATE (A) NEGATIVE   RBC / HPF 0-5 0 - 5 RBC/hpf   WBC, UA 6-10 0 - 5 WBC/hpf   Bacteria, UA RARE (A) NONE SEEN   Squamous Epithelial / LPF 0-5 0 - 5  Pregnancy, urine  Result Value Ref Range   Preg Test, Ur NEGATIVE NEGATIVE      Assessment & Plan:   Problem List Items Addressed This Visit      Cardiovascular and Mediastinum   Varicose veins of both lower extremities    Consider horse chestnut supplement; use compression stockings; refer to vascular surgery      Relevant Medications   EPINEPHrine (EPIPEN 2-PAK) 0.3 mg/0.3 mL IJ SOAJ injection   Other Relevant Orders   Ambulatory referral to Vascular Surgery     Respiratory   Uncontrolled persistent asthma    Patient has reacted to Symbicort, Breo, Advair; will try singulair and flovent and get her to pulmonologist as soon as possible; checking IgE today      Relevant Medications   albuterol (PROAIR HFA) 108 (90 Base) MCG/ACT inhaler   fluticasone (FLOVENT HFA) 220 MCG/ACT inhaler   montelukast (SINGULAIR) 10 MG tablet   Other Relevant Orders   IgE   Ambulatory referral to Pulmonology   Spirometry with Graph     Genitourinary   Hematuria, microscopic    Check urine today  with micro      Relevant Orders   Urinalysis w microscopic + reflex cultur     Other   Obesity (BMI 35.0-39.9 without comorbidity)   Anemia    Chronic issue, despite oral iron intake; check urine for blood, check CBC, ferritin, iron, TIBC      Relevant Orders   Iron, TIBC and Ferritin Panel   ADHD (attention deficit hyperactivity disorder), inattentive type    Will refer to psychologist for confirmation of ADHD inattentive type, and if confirmed, I'll be willing to prescribe Concerta and follow      Relevant Orders   Ambulatory referral to Psychology    Other Visit Diagnoses    Constipation, unspecified constipation type       check tsh today   Preventative health care       labs today, return for physical soon   Relevant Orders   CBC with Differential/Platelet   TSH   COMPLETE METABOLIC PANEL WITH GFR   Lipid panel   Other fatigue       check labs   Relevant Orders   VITAMIN D 25 Hydroxy (Vit-D Deficiency, Fractures)   Need for pneumococcal vaccination       recommended and given today; next booster at age 59 per current ACIP guidelines   Relevant Orders   Pneumococcal polysaccharide vaccine 23-valent greater than or equal to 2yo subcutaneous/IM (Completed)  Follow up plan: No follow-ups on file.  An after-visit summary was printed and given to the patient at check-out.  Please see the patient instructions which may contain other information and recommendations beyond what is mentioned above in the assessment and plan.  Meds ordered this encounter  Medications  . acyclovir (ZOVIRAX) 800 MG tablet    Sig: Take 1 tablet (800 mg total) by mouth 3 (three) times daily. For two days with outbreaks; one daily for suppression    Dispense:  38 tablet    Refill:  5    Contact me if you know of a better medication or dose for daily suppressive therapy for genital herpes, HIV negative  . albuterol (PROAIR HFA) 108 (90 Base) MCG/ACT inhaler    Sig: Inhale 2 puffs into  the lungs every 4 (four) hours as needed for wheezing or shortness of breath.    Dispense:  1 Inhaler    Refill:  1  . fluticasone (FLOVENT HFA) 220 MCG/ACT inhaler    Sig: Inhale 2 puffs into the lungs 2 (two) times daily. Until you see pulmonologist    Dispense:  1 Inhaler    Refill:  0  . EPINEPHrine (EPIPEN 2-PAK) 0.3 mg/0.3 mL IJ SOAJ injection    Sig: Inject 0.3 mLs (0.3 mg total) into the muscle as needed for anaphylaxis. For life-threatening allergic or anaphylactic reaction    Dispense:  1 Device    Refill:  1  . montelukast (SINGULAIR) 10 MG tablet    Sig: Take 1 tablet (10 mg total) by mouth at bedtime.    Dispense:  30 tablet    Refill:  3    Orders Placed This Encounter  Procedures  . Pneumococcal polysaccharide vaccine 23-valent greater than or equal to 2yo subcutaneous/IM  . IgE  . CBC with Differential/Platelet  . Iron, TIBC and Ferritin Panel  . TSH  . VITAMIN D 25 Hydroxy (Vit-D Deficiency, Fractures)  . Urinalysis w microscopic + reflex cultur  . COMPLETE METABOLIC PANEL WITH GFR  . Lipid panel  . Ambulatory referral to Pulmonology  . Ambulatory referral to Psychology  . Ambulatory referral to Vascular Surgery  . Spirometry with Graph  spirometry evaluated, improvement in post-bronchodilator readings  Face-to-face time with patient was more than 45 minutes, >50% time spent counseling and coordination of care

## 2018-06-07 NOTE — Assessment & Plan Note (Signed)
Check urine today with micro 

## 2018-06-07 NOTE — Assessment & Plan Note (Signed)
Consider horse chestnut supplement; use compression stockings; refer to vascular surgery

## 2018-06-07 NOTE — Patient Instructions (Addendum)
Consider L-lysine or B complex ("stress tab") vitamins Consider horse chestnut supplement for varicose veins We'll get you to the vascular doctor soon Wear compression stockings, put on first thing in the morning and remove before bed -- do NOT sleep in them  Start the Singulair (montelukast) Start the Flovent, but stop that if you develop itching or other problems We'll have you see the pulmonologist as soon as they can get you in for an appointment Avoid any and all triggers for your asthma and allergies Carry your rescue inhaler with you Go to the emergency department right away if you have significant trouble breathing  Return in 4 weeks, but call before then if needed  Pneumococcal Polysaccharide Vaccine: What You Need to Know 1. Why get vaccinated? Vaccination can protect older adults (and some children and younger adults) from pneumococcal disease. Pneumococcal disease is caused by bacteria that can spread from person to person through close contact. It can cause ear infections, and it can also lead to more serious infections of the:  Lungs (pneumonia),  Blood (bacteremia), and  Covering of the brain and spinal cord (meningitis). Meningitis can cause deafness and brain damage, and it can be fatal. Anyone can get pneumococcal disease, but children under 70 years of age, people with certain medical conditions, adults over 30 years of age, and cigarette smokers are at the highest risk. About 18,000 older adults die each year from pneumococcal disease in the [place]. Treatment of pneumococcal infections with penicillin and other drugs used to be more effective. But some strains of the disease have become resistant to these drugs. This makes prevention of the disease, through vaccination, even more important. 2. Pneumococcal polysaccharide vaccine (PPSV23) Pneumococcal polysaccharide vaccine (PPSV23) protects against 23 types of pneumococcal bacteria. It will not prevent all  pneumococcal disease. PPSV23 is recommended for:  All adults 60 years of age and older,  Anyone 2 through 28 years of age with certain long-term health problems,  Anyone 2 through 28 years of age with a weakened immune system,  Adults 109 through 28 years of age who smoke cigarettes or have asthma. Most people need only one dose of PPSV. A second dose is recommended for certain high-risk groups. People 81 and older should get a dose even if they have gotten one or more doses of the vaccine before they turned 65. Your healthcare provider can give you more information about these recommendations. Most healthy adults develop protection within 2 to 3 weeks of getting the shot. 3. Some people should not get this vaccine  Anyone who has had a life-threatening allergic reaction to PPSV should not get another dose.  Anyone who has a severe allergy to any component of PPSV should not receive it. Tell your provider if you have any severe allergies.  Anyone who is moderately or severely ill when the shot is scheduled may be asked to wait until they recover before getting the vaccine. Someone with a mild illness can usually be vaccinated.  Children less than 33 years of age should not receive this vaccine.  There is no evidence that PPSV is harmful to either a pregnant woman or to her fetus. However, as a precaution, women who need the vaccine should be vaccinated before becoming pregnant, if possible. 4. Risks of a vaccine reaction With any medicine, including vaccines, there is a chance of side effects. These are usually mild and go away on their own, but serious reactions are also possible. About half of people who  get PPSV have mild side effects, such as redness or pain where the shot is given, which go away within about two days. Less than 1 out of 100 people develop a fever, muscle aches, or more severe local reactions. Problems that could happen after any vaccine:  People sometimes faint after  a medical procedure, including vaccination. Sitting or lying down for about 15 minutes can help prevent fainting, and injuries caused by a fall. Tell your doctor if you feel dizzy, or have vision changes or ringing in the ears.  Some people get severe pain in the shoulder and have difficulty moving the arm where a shot was given. This happens very rarely.  Any medication can cause a severe allergic reaction. Such reactions from a vaccine are very rare, estimated at about 1 in a million doses, and would happen within a few minutes to a few hours after the vaccination. As with any medicine, there is a very remote chance of a vaccine causing a serious injury or death. The safety of vaccines is always being monitored. For more information, visit: http://floyd.org/ 5. What if there is a serious reaction? What should I look for? Look for anything that concerns you, such as signs of a severe allergic reaction, very high fever, or unusual behavior. Signs of a severe allergic reaction can include hives, swelling of the face and throat, difficulty breathing, a fast heartbeat, dizziness, and weakness. These would usually start a few minutes to a few hours after the vaccination. What should I do? If you think it is a severe allergic reaction or other emergency that can't wait, call 9-1-1 or get to the nearest hospital. Otherwise, call your doctor. Afterward, the reaction should be reported to the Vaccine Adverse Event Reporting System (VAERS). Your doctor might file this report, or you can do it yourself through the VAERS web site at www.vaers.LAgents.no, or by calling 1-(440)554-1917. VAERS does not give medical advice. 6. How can I learn more?  Ask your doctor. He or she can give you the vaccine package insert or suggest other sources of information.  Call your local or state health department.  Contact the Centers for Disease Control and Prevention (CDC): ? Call 4128457671 (1-800-CDC-INFO)  or ? Visit CDC's website at PicCapture.uy CDC Vaccine Information Statement PPSV Vaccine (08/09/2013) This information is not intended to replace advice given to you by your health care provider. Make sure you discuss any questions you have with your health care provider. Document Released: 01/30/2006 Document Revised: 11/14/2017 Document Reviewed: 11/14/2017 Elsevier Interactive Patient Education  2019 [employer].

## 2018-06-07 NOTE — Assessment & Plan Note (Addendum)
Patient has reacted to Symbicort, Breo, Advair; will try singulair and flovent and get her to pulmonologist as soon as possible; checking IgE today

## 2018-06-08 ENCOUNTER — Other Ambulatory Visit: Payer: Self-pay | Admitting: Family Medicine

## 2018-06-08 ENCOUNTER — Ambulatory Visit: Payer: Self-pay

## 2018-06-08 DIAGNOSIS — M79642 Pain in left hand: Secondary | ICD-10-CM

## 2018-06-08 DIAGNOSIS — M79641 Pain in right hand: Secondary | ICD-10-CM

## 2018-06-08 MED ORDER — NITROFURANTOIN MONOHYD MACRO 100 MG PO CAPS: 100.0000 mg | ORAL_CAPSULE | Freq: Two times a day (BID) | ORAL | 0 refills | Status: DC

## 2018-06-08 NOTE — Progress Notes (Signed)
Diane Williamson, please let the patient know that her IgE was indeed quite high, so we'll want her to get in to see the pulmonologist soon. I hope the new Singulair (montelukast) will help her asthma and allergies.  She is anemic, with low total iron stores (ferritin level). Encourage her to start an iron supplement three days a week to help bring these levels up.  Her vitamin D is low. Please have her take 2,000 iu of vitamin D3 over-the-counter once a day for the next 3 months, then just 800 iu daily when not outside getting 20 minutes or more of sun exposure. Probably bladder infection, so start macrobid (Rx sent).

## 2018-06-08 NOTE — Progress Notes (Signed)
macrobid x 3 days

## 2018-06-09 LAB — CBC WITH DIFFERENTIAL/PLATELET
Absolute Monocytes: 290 {cells}/uL (ref 200–950)
Basophils Absolute: 21 {cells}/uL (ref 0–200)
Basophils Relative: 0.3 %
Eosinophils Absolute: 138 {cells}/uL (ref 15–500)
Eosinophils Relative: 2 %
HCT: 33 % — ABNORMAL LOW (ref 35.0–45.0)
Hemoglobin: 11 g/dL — ABNORMAL LOW (ref 11.7–15.5)
Lymphs Abs: 2953 {cells}/uL (ref 850–3900)
MCH: 27.9 pg (ref 27.0–33.0)
MCHC: 33.3 g/dL (ref 32.0–36.0)
MCV: 83.8 fL (ref 80.0–100.0)
MPV: 10 fL (ref 7.5–12.5)
Monocytes Relative: 4.2 %
Neutro Abs: 3498 {cells}/uL (ref 1500–7800)
Neutrophils Relative %: 50.7 %
Platelets: 267 Thousand/uL (ref 140–400)
RBC: 3.94 Million/uL (ref 3.80–5.10)
RDW: 13.8 % (ref 11.0–15.0)
Total Lymphocyte: 42.8 %
WBC: 6.9 Thousand/uL (ref 3.8–10.8)

## 2018-06-09 LAB — LIPID PANEL
Cholesterol: 162 mg/dL (ref <200)
HDL: 48 mg/dL — ABNORMAL LOW (ref >=50)
LDL Cholesterol (Calc): 91 mg/dL
Non-HDL Cholesterol (Calc): 114 mg/dL (ref <130)
Total CHOL/HDL Ratio: 3.4 (calc) (ref <5.0)
Triglycerides: 135 mg/dL (ref <150)

## 2018-06-09 LAB — TSH: TSH: 1.02 m[IU]/L

## 2018-06-09 LAB — COMPLETE METABOLIC PANEL WITH GFR
AG RATIO: 1.6 (calc) (ref 1.0–2.5)
ALKALINE PHOSPHATASE (APISO): 45 U/L (ref 31–125)
ALT: 9 U/L (ref 6–29)
AST: 11 U/L (ref 10–30)
Albumin: 4.2 g/dL (ref 3.6–5.1)
BILIRUBIN TOTAL: 0.2 mg/dL (ref 0.2–1.2)
BUN: 7 mg/dL (ref 7–25)
CHLORIDE: 108 mmol/L (ref 98–110)
CO2: 24 mmol/L (ref 20–32)
Calcium: 9.1 mg/dL (ref 8.6–10.2)
Creat: 0.7 mg/dL (ref 0.50–1.10)
GFR, EST AFRICAN AMERICAN: 138 mL/min/{1.73_m2} (ref >=60)
GFR, Est Non African American: 119 mL/min/{1.73_m2} (ref >=60)
GLUCOSE: 85 mg/dL (ref 65–99)
Globulin: 2.6 g/dL (calc) (ref 1.9–3.7)
POTASSIUM: 3.7 mmol/L (ref 3.5–5.3)
Sodium: 140 mmol/L (ref 135–146)
Total Protein: 6.8 g/dL (ref 6.1–8.1)

## 2018-06-09 LAB — IGE: IgE (Immunoglobulin E), Serum: 273 kU/L — ABNORMAL HIGH (ref <=114)

## 2018-06-09 LAB — URINE CULTURE
MICRO NUMBER:: 226152
SPECIMEN QUALITY:: ADEQUATE

## 2018-06-09 LAB — URINALYSIS W MICROSCOPIC + REFLEX CULTURE
Bilirubin Urine: NEGATIVE
Glucose, UA: NEGATIVE
Hgb urine dipstick: NEGATIVE
Ketones, ur: NEGATIVE
Nitrites, Initial: NEGATIVE
Protein, ur: NEGATIVE
Specific Gravity, Urine: 1.015 (ref 1.001–1.03)
pH: 7 (ref 5.0–8.0)

## 2018-06-09 LAB — IRON,TIBC AND FERRITIN PANEL
%SAT: 13 % — AB (ref 16–45)
Ferritin: 5 ng/mL — ABNORMAL LOW (ref 16–154)
Iron: 55 ug/dL (ref 40–190)
TIBC: 437 ug/dL (ref 250–450)

## 2018-06-09 LAB — VITAMIN D 25 HYDROXY (VIT D DEFICIENCY, FRACTURES): Vit D, 25-Hydroxy: 27 ng/mL — ABNORMAL LOW (ref 30–100)

## 2018-06-09 LAB — CULTURE INDICATED

## 2018-06-10 NOTE — Progress Notes (Signed)
Diane Williamson, please let the patient know that her other labs are back, see notes Also, her urine culture did not grow out any overwhelming infection, but if she is having symptoms, please invite her to return for repeat clean catch urine dip and reflex culture

## 2018-06-11 ENCOUNTER — Encounter: Payer: Self-pay | Admitting: Family Medicine

## 2018-06-27 ENCOUNTER — Ambulatory Visit (INDEPENDENT_AMBULATORY_CARE_PROVIDER_SITE_OTHER): Payer: BLUE CROSS/BLUE SHIELD | Admitting: Internal Medicine

## 2018-06-27 ENCOUNTER — Other Ambulatory Visit: Payer: Self-pay

## 2018-06-27 ENCOUNTER — Encounter: Payer: Self-pay | Admitting: Internal Medicine

## 2018-06-27 VITALS — BP 122/74 | HR 78 | Ht 70.0 in | Wt 249.9 lb

## 2018-06-27 DIAGNOSIS — K219 Gastro-esophageal reflux disease without esophagitis: Secondary | ICD-10-CM | POA: Diagnosis not present

## 2018-06-27 DIAGNOSIS — J45909 Unspecified asthma, uncomplicated: Secondary | ICD-10-CM | POA: Diagnosis not present

## 2018-06-27 MED ORDER — PANTOPRAZOLE SODIUM 40 MG PO TBEC: 40.0000 mg | DELAYED_RELEASE_TABLET | Freq: Every day | ORAL | 1 refills | Status: DC

## 2018-06-27 MED ORDER — CETIRIZINE HCL 10 MG PO TABS: 10.0000 mg | ORAL_TABLET | Freq: Every day | ORAL | 6 refills | Status: DC

## 2018-06-27 MED ORDER — TRIAMCINOLONE ACETONIDE 55 MCG/ACT NA AERO: 1.0000 | INHALATION_SPRAY | Freq: Two times a day (BID) | NASAL | 2 refills | Status: DC

## 2018-06-27 MED ORDER — MOMETASONE FURO-FORMOTEROL FUM 200-5 MCG/ACT IN AERO: 2.0000 | INHALATION_SPRAY | Freq: Two times a day (BID) | RESPIRATORY_TRACT | 6 refills | Status: DC

## 2018-06-27 NOTE — Patient Instructions (Addendum)
AVOID TRIGGERS   WEAR MASK AT WORK  START DULERA-use as directed  START NASCORT NASAL SPRAY-use as directed  START ZYRTEC 10 mg daily  START PROTONIX for REFLUX-use as directed

## 2018-06-27 NOTE — Progress Notes (Signed)
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  has a past surgical history that includes No past surgeries. Prior to Admission medications   Medication Sig Start Date End Date Taking? Authorizing Provider  acyclovir (ZOVIRAX) 800 MG tablet Take 1 tablet (800 mg total) by mouth 3 (three) times daily. For two days with outbreaks; one daily for suppression 06/07/18  Yes Lada, Janit Bern, MD  albuterol (PROAIR HFA) 108 (90 Base) MCG/ACT inhaler Inhale 2 puffs into the lungs every 4 (four) hours as needed for wheezing or shortness of breath. 06/07/18  Yes Lada, Janit Bern, MD  EPINEPHrine (EPIPEN 2-PAK) 0.3 mg/0.3 mL IJ SOAJ injection Inject 0.3 mLs (0.3 mg total) into the muscle as needed for anaphylaxis. For life-threatening allergic or anaphylactic reaction 06/07/18   Yes Lada, Janit Bern, MD  fluticasone (FLOVENT HFA) 220 MCG/ACT inhaler Inhale 2 puffs into the lungs 2 (two) times daily. Until you see pulmonologist 06/07/18  Yes Lada, Janit Bern, MD  meclizine (ANTIVERT) 25 MG tablet Take 1 tablet (25 mg total) by mouth 3 (three) times daily as needed for dizziness. 08/17/17  Yes Cook, Jayce G, DO  montelukast (SINGULAIR) 10 MG tablet Take 1 tablet (10 mg total) by mouth at bedtime. 06/07/18  Yes Lada, Janit Bern, MD  nitrofurantoin, macrocrystal-monohydrate, (MACROBID) 100 MG capsule Take 1 capsule (100 mg total) by mouth 2 (two) times daily. 06/08/18  Yes Lada, Janit Bern, MD   Allergies  Allergen Reactions   Benadryl Allergy [Diphenhydramine Hcl]     Rapid heart rate.    FAMILY HISTORY:  family history includes AAA (abdominal aortic aneurysm) in her paternal grandmother; ADD / ADHD in her father; Atrial fibrillation in her paternal grandmother; Cancer in her maternal grandmother; Diabetes in her maternal grandfather and paternal grandmother; Heart attack in her paternal grandfather; Heart disease in her maternal grandmother; Varicose Veins in her paternal grandfather. SOCIAL HISTORY:  reports that she quit smoking about 2 years ago. Her smoking use included cigarettes. She has a 3.50 pack-year smoking history. She has never used smokeless tobacco. She reports previous alcohol use. She reports that she does not use drugs.  REVIEW OF SYSTEMS:   Constitutional: Negative for fever, chills, weight loss, malaise/fatigue and diaphoresis.  HENT: Negative for hearing loss, ear pain, nosebleeds, congestion, sore  throat, neck pain, tinnitus and ear discharge.   Eyes: Negative for blurred vision, double vision, photophobia, pain, discharge and redness.  Respiratory: + Cough,-hemoptysis,-sputum production, shortness of breath, + + wheezing and-stridor.   Cardiovascular: Negative for chest pain, palpitations, orthopnea, claudication, leg swelling and PND.  Gastrointestinal:  Negative for heartburn, nausea, vomiting, abdominal pain, diarrhea, constipation, blood in stool and melena.  Genitourinary: Negative for dysuria, urgency, frequency, hematuria and flank pain.  Musculoskeletal: Negative for myalgias, back pain, joint pain and falls.  Skin: Negative for itching and rash.  Neurological: Negative for dizziness, tingling, tremors, sensory change, speech change, focal weakness, seizures, loss of consciousness, weakness and headaches.  Endo/Heme/Allergies: Negative for environmental allergies and polydipsia. Does not bruise/bleed easily.  ALL OTHER ROS ARE NEGATIVE  BP 122/74 (BP Location: Left Arm, Cuff Size: Normal)    Pulse 78    Ht  (1.778 m)    Wt 249 lb 14.4 oz (113.4 kg)    SpO2 98%    BMI 35.86 kg/m    Physical Examination:   GENERAL:NAD, no fevers, chills, no weakness no fatigue HEAD: Normocephalic, atraumatic.  EYES: Pupils equal, round, reactive to light. Extraocular muscles intact. No scleral icterus.  MOUTH: Moist mucosal membrane.   EAR, NOSE, THROAT: Clear without exudates. No external lesions.  NECK: Supple. No thyromegaly. No nodules. No JVD.  PULMONARY:CTA B/L no wheezes, no crackles, no rhonchi CARDIOVASCULAR: S1 and S2. Regular rate and rhythm. No murmurs, rubs, or gallops. No edema.  GASTROINTESTINAL: Soft, nontender, nondistended. No masses. Positive bowel sounds.  MUSCULOSKELETAL: No swelling, clubbing, or edema. Range of motion full in all extremities.  NEUROLOGIC: Cranial nerves II through XII are intact. No gross focal neurological deficits.  SKIN: No ulceration, lesions, rashes, or cyanosis. Skin warm and dry. Turgor intact.  PSYCHIATRIC: Mood, affect within normal limits. The patient is awake, alert and oriented x 3. Insight, judgment intact.    CBC    Component Value Date/Time   WBC 6.9 06/07/2018 1507   RBC 3.94 06/07/2018 1507   HGB 11.0 (L) 06/07/2018 1507   HGB 12.0 08/05/2013 1857   HCT 33.0 (L) 06/07/2018 1507    HCT 36.1 08/05/2013 1857   PLT 267 06/07/2018 1507   PLT 242 08/05/2013 1857   MCV 83.8 06/07/2018 1507   MCV 87 08/05/2013 1857   MCH 27.9 06/07/2018 1507   MCHC 33.3 06/07/2018 1507   RDW 13.8 06/07/2018 1507   RDW 12.8 08/05/2013 1857   LYMPHSABS 2,953 06/07/2018 1507   EOSABS 138 06/07/2018 1507   BASOSABS 21 06/07/2018 1507   BMP Latest Ref Rng & Units 06/07/2018 09/26/2017 08/05/2013  Glucose 65 - 99 mg/dL 85 161(W) 90  BUN 7 - 25 mg/dL Creatinine 0.50 - 1.10 mg/dL 9.60 4.54 0.98  BUN/Creat Ratio 6 - 22 (calc) NOT APPLICABLE - -  Sodium 135 - 146 mmol/L 140 137 137  Potassium 3.5 - 5.3 mmol/L 3.7 3.6 3.8  Chloride 98 - 110 mmol/L 108 104 104  CO2 20 - 32 mmol/L Calcium 8.6 - 10.2 mg/dL 9.1 8.9 8.6    IgE level 273 Eosinophil count 138   ASSESSMENT AND PLAN SYNOPSIS  28 year old obese pleasant white female seen today for chronic persistent severe asthma with history of allergic rhinitis in the setting of GERD which is uncontrolled  At this time I have recommended several treatment options at this time  I have strongly advised that patient wear a mask  at work which most likely is contributing to her symptoms 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 All questions answered Follow-up in 4 weeks   Kameren Baade Santiago Glad, M.D.  [facility]  Medical Director [facility] Medical Director [facility] Cardio-Pulmonary Department

## 2018-07-02 ENCOUNTER — Ambulatory Visit: Payer: BLUE CROSS/BLUE SHIELD | Admitting: Family Medicine

## 2018-07-03 ENCOUNTER — Other Ambulatory Visit: Payer: Self-pay

## 2018-07-03 ENCOUNTER — Ambulatory Visit (INDEPENDENT_AMBULATORY_CARE_PROVIDER_SITE_OTHER): Payer: BLUE CROSS/BLUE SHIELD | Admitting: Nurse Practitioner

## 2018-07-03 ENCOUNTER — Encounter: Payer: Self-pay | Admitting: Nurse Practitioner

## 2018-07-03 VITALS — BP 118/70 | HR 78 | Temp 98.2°F | Resp 14 | Ht 70.0 in | Wt 254.3 lb

## 2018-07-03 DIAGNOSIS — R102 Pelvic and perineal pain: Secondary | ICD-10-CM | POA: Diagnosis not present

## 2018-07-03 DIAGNOSIS — J45998 Other asthma: Secondary | ICD-10-CM

## 2018-07-03 DIAGNOSIS — E559 Vitamin D deficiency, unspecified: Secondary | ICD-10-CM | POA: Insufficient documentation

## 2018-07-03 DIAGNOSIS — J309 Allergic rhinitis, unspecified: Secondary | ICD-10-CM | POA: Insufficient documentation

## 2018-07-03 DIAGNOSIS — D508 Other iron deficiency anemias: Secondary | ICD-10-CM | POA: Diagnosis not present

## 2018-07-03 DIAGNOSIS — K219 Gastro-esophageal reflux disease without esophagitis: Secondary | ICD-10-CM | POA: Insufficient documentation

## 2018-07-03 DIAGNOSIS — J3089 Other allergic rhinitis: Secondary | ICD-10-CM

## 2018-07-03 DIAGNOSIS — L309 Dermatitis, unspecified: Secondary | ICD-10-CM | POA: Insufficient documentation

## 2018-07-03 LAB — POCT URINALYSIS DIPSTICK
BILIRUBIN UA: NEGATIVE
Blood, UA: NEGATIVE
GLUCOSE UA: NEGATIVE
Ketones, UA: NEGATIVE
LEUKOCYTES UA: NEGATIVE
Nitrite, UA: NEGATIVE
Protein, UA: NEGATIVE
SPEC GRAV UA: 1.015 (ref 1.010–1.025)
Urobilinogen, UA: NEGATIVE E.U./dL — AB
pH, UA: 6 (ref 5.0–8.0)

## 2018-07-03 NOTE — Progress Notes (Signed)
Name: Diane Williamson   MRN: [id number]    DOB: [birth year]   Date:07/03/2018       Progress Note  Subjective  Chief Complaint  Chief Complaint  Patient presents with  . Follow-up    HPI   Patient endorses suprapubic pressure and pain started 2 months ago. Pain is intermittent, sharp lasts a few minutes and resolves. Sometimes has a few episodes a day and then will go days without it. Started back up yesterday. Denies nausea, vomiting, diarrhea, dysuria, hematuria, vaginal discharge or bleeding. Patient hasn't tried anything for the pain because it goes away so fast. Patient was more constipated but hasn't been recently, last BM was 2 days ago did have some straining. States pain is typically more right but feels more center presently. Patient is on the nuvaring- for birth control and irregular periods- periods are now regular.  Patient has been working with a Marketing executive recently who has been helping her increase her activity and make healthy diet choices.   Iron Deficiency Anemia Patient started on iron supplements last month, last hemoglobin was 11, Ferritin 5.   Denies chest pain, weakness, lethargy.   Vitamin D deficiency Encouraged to take 2,000 IU of vitamin D daily. Feels like she has much more energy.   Chronic severe persistent asthma Saw pulmonologist- Dr. Belia Heman who prescribed [facility] and recommend using albuterol 2 to 4 puffs every 4 hours as needed, and to wear a mask at work. Patient is on workman's comp hasn't been back to work yet. Took dulera for 2 doses- but noted chest tightness and felt very congested and hard to breath- states she feels some improvement. Patient has been using albuterol 6-7 times a day.   Chronic allergic rhinitis Saw pulmonologist- Dr. Belia Heman who prescribed Zyrtec 10 mg daily and Nasacort in addition to Singulair prescribed by PCP. Discussed black box warning for Singulair. Symptoms were severe- well controlled with this regimen.    GERD precribed Protonix 40 mg daily for reflux, states working well.   GAD 7 : Generalized Anxiety Score 07/03/2018  Nervous, Anxious, on Edge 1  Control/stop worrying 1  Worry too much - different things 1  Trouble relaxing 1  Restless 0  Easily annoyed or irritable 0  Afraid - awful might happen 0  Total GAD 7 Score 4  Anxiety Difficulty Not difficult at all     Depression screen [facility] 2/9 07/03/2018 06/07/2018  Decreased Interest 0 0  Down, Depressed, Hopeless 0 1  PHQ - 2 Score 0 1  Altered sleeping 2 3  Tired, decreased energy 1 0  Change in appetite 0 1  Feeling bad or failure about yourself  0 3  Trouble concentrating 1 3  Moving slowly or fidgety/restless 0 0  Suicidal thoughts 0 0  PHQ-9 Score 4 11  Difficult doing work/chores Somewhat difficult Somewhat difficult    PHQ reviewed. Negative  Patient Active Problem List   Diagnosis Date Noted  . Eczema 07/03/2018  . Allergic rhinitis 07/03/2018  . GERD (gastroesophageal reflux disease) 07/03/2018  . Obesity (BMI 35.0-39.9 without comorbidity) 06/07/2018  . Uncontrolled persistent asthma 06/07/2018  . Anemia 06/07/2018  . Hematuria, microscopic 06/07/2018  . ADHD (attention deficit hyperactivity disorder), 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/2018  . Varicose veins of both lower extremities 06/07/2018    Past Medical History:  Diagnosis Date  . ADD (attention deficit disorder)   . Asthma   . Cystitis   . Genital herpes     Past Surgical History:  Procedure Laterality Date  . NO PAST SURGERIES      Social History   Tobacco Use  . Smoking status: Former Smoker    Packs/day: 0.50    Years: 7.00    Pack years: 3.50    Types: Cigarettes    Last attempt to quit: 01/17/2016    Years since quitting: 2.4  . Smokeless tobacco: Never Used  Substance Use Topics  . Alcohol use: Not Currently     Current Outpatient Medications:  .  acyclovir (ZOVIRAX) 800 MG tablet, Take 1 tablet (800 mg total) by mouth 3 (three)  times daily. For two days with outbreaks; one daily for suppression, Disp: 38 tablet, Rfl: 5 .  albuterol (PROAIR HFA) 108 (90 Base) MCG/ACT inhaler, Inhale 2 puffs into the lungs every 4 (four) hours as needed for wheezing or shortness of breath., Disp: 1 Inhaler, Rfl: 1 .  cetirizine (ZYRTEC ALLERGY) 10 MG tablet, Take 1 tablet (10 mg total) by mouth daily., Disp: 30 tablet, Rfl: 6 .  EPINEPHrine (EPIPEN 2-PAK) 0.3 mg/0.3 mL IJ SOAJ injection, Inject 0.3 mLs (0.3 mg total) into the muscle as needed for anaphylaxis. For life-threatening allergic or anaphylactic reaction, Disp: 1 Device, Rfl: 1 .  montelukast (SINGULAIR) 10 MG tablet, Take 1 tablet (10 mg total) by mouth at bedtime., Disp: 30 tablet, Rfl: 3 .  pantoprazole (PROTONIX) 40 MG tablet, Take 1 tablet (40 mg total) by mouth daily., Disp: 30 tablet, Rfl: 1 .  triamcinolone (NASACORT) 55 MCG/ACT AERO nasal inhaler, Place 1 spray into the nose 2 (two) times daily., Disp: 1 Inhaler, Rfl: 2  Allergies  Allergen Reactions  . Benadryl Allergy [Diphenhydramine Hcl]     Rapid heart rate.    ROS   No other specific complaints in a complete review of systems (except as listed in HPI above).  Objective  Vitals:   07/03/18 1327  BP: 118/70  Pulse: 78  Resp: 14  Temp: 98.2 F (36.8 C)  SpO2: 99%  Weight: 254 lb 4.8 oz (115.3 kg)  Height: 5\' 10"  (1.778 m)     Body mass index is 36.49 kg/m.  Nursing Note and Vital Signs reviewed.  Physical Exam Vitals signs reviewed.  Constitutional:      Appearance: She is well-developed.  HENT:     Head: Normocephalic and atraumatic.  Neck:     Musculoskeletal: Normal range of motion and neck supple.     Vascular: No carotid bruit.  Cardiovascular:     Heart sounds: Normal heart sounds.  Pulmonary:     Effort: Pulmonary effort is normal.     Breath sounds: Normal breath sounds.  Abdominal:     General: Bowel sounds are decreased.     Palpations: Abdomen is soft.     Tenderness:  There is no abdominal tenderness. There is no right CVA tenderness or left CVA tenderness.  Musculoskeletal: Normal range of motion.  Skin:    General: Skin is warm and dry.     Capillary Refill: Capillary refill takes less than 2 seconds.  Neurological:     Mental Status: She is alert and oriented to person, place, and time.     GCS: GCS eye subscore is 4. GCS verbal subscore is 5. GCS motor subscore is 6.     Sensory: No sensory deficit.  Psychiatric:        Speech: Speech normal.        Behavior: Behavior normal.        Thought Content:  Thought content normal.        Judgment: Judgment normal.        Results for orders placed or performed in visit on 07/03/18 (from the past 48 hour(s))  POCT Urinalysis Dipstick     Status: Abnormal   Collection Time: 07/03/18  2:23 PM  Result Value Ref Range   Color, UA yellow    Clarity, UA clear    Glucose, UA Negative Negative   Bilirubin, UA negative    Ketones, UA negative    Spec Grav, UA 1.015 1.010 - 1.025   Blood, UA negative    pH, UA 6.0 5.0 - 8.0   Protein, UA Negative Negative   Urobilinogen, UA negative (A) 0.2 or 1.0 E.U./dL   Nitrite, UA negative    Leukocytes, UA Negative Negative   Appearance     Odor      Assessment & Plan  1. Suprapubic pressure Patient endorses lower suprapubic pressure and intermittent sharp abdominal pains ongoing for the past 2 months.  Reviewed blood work from previous visit with her, pain was going on during this timeframe but states had stopped prior to the visit.  Pain is mild in severity, physical exam was negative discussed possible likely sources including but not limited to abdominal gas, constipation, ovarian cysts, UTI.  Urine dipstick negative.  Patient not sexually active declines STD testing.  Will try increasing fiber in diet, taking simethicone or ibuprofen as needed for pain if pain is not improved or worsening will let us know.  Due for blood work today as symptoms have been same  for the past 2 weeks and had recent blood work.  Patient is agreeable to this plan as symptoms are mild and not currently present. - POCT Urinalysis Dipstick  2. Allergic rhinitis due to other allergic trigger, unspecified seasonality Discussed black box warning for singulair, patient would like to continue due to severe symptoms will inform us and stop meds immediately if having concerns.   3. Gastroesophageal reflux disease, esophagitis presence not specified Discussed diet, weight loss discussed PPI blackbox warning recommend taking just as needed  4. Iron deficiency anemia secondary to inadequate dietary iron intake Patient to continue taking iron supplementation, will recheck a follow-up  5. Vitamin D deficiency Recommend continue taking vitamin D supplementation, will recheck at follow-up  6. Uncontrolled persistent asthma Patient stopped taking Dulera and has been using albuterol 6-8 times a day, recommended she call pulmonologist today to find a medication that works better for her.  Discussed risks of taking albuterol frequently.  States today she is not having any shortness of breath, wheezing, cough.  States she has tried multiple maintenance inhalers but has had negative side effects.  She will discuss this with her pulmonologist

## 2018-07-03 NOTE — Patient Instructions (Addendum)
-   Please call or message your pulmonologist today to adjust your medication.  - For your lower abdominal pain- try increasing fiber in your diet, using simethicone OTC as needed if pain is not relieved or worsening please let us know so we can further evaluate.  - Continue iron and vitamin D supplementation   Singulair- new black box warning:it has been linked with an increased risk for "agitation, depression, sleeping problems, and suicidal thoughts and actions  Caution: prolonged use of proton pump inhibitors like omeprazole (Prilosec), pantoprazole (Protonix), esomeprazole (Nexium), and others like Dexilant and Aciphex may increase your risk of pneumonia, Clostridium difficile colitis, osteoporosis, anemia and other health complications Try to limit or avoid triggers like coffee, caffeinated beverages, onions, chocolate, spicy foods, peppermint, acid foods like pizza, spaghetti sauce, and orange juice Lose weight if you are overweight or obese Try elevating the head of your bed by placing a small wedge between your mattress and box springs to keep acid in the stomach at night instead of coming up into your esophagus

## 2018-07-09 ENCOUNTER — Encounter (INDEPENDENT_AMBULATORY_CARE_PROVIDER_SITE_OTHER): Payer: BLUE CROSS/BLUE SHIELD | Admitting: Vascular Surgery

## 2018-07-22 DIAGNOSIS — R070 Pain in throat: Secondary | ICD-10-CM | POA: Diagnosis not present

## 2018-07-22 DIAGNOSIS — J029 Acute pharyngitis, unspecified: Secondary | ICD-10-CM | POA: Diagnosis not present

## 2018-08-08 DIAGNOSIS — A084 Viral intestinal infection, unspecified: Secondary | ICD-10-CM | POA: Diagnosis not present

## 2018-08-15 ENCOUNTER — Encounter: Payer: Self-pay | Admitting: Internal Medicine

## 2018-08-15 ENCOUNTER — Telehealth: Payer: Self-pay | Admitting: Internal Medicine

## 2018-08-15 ENCOUNTER — Ambulatory Visit (INDEPENDENT_AMBULATORY_CARE_PROVIDER_SITE_OTHER): Payer: BLUE CROSS/BLUE SHIELD | Admitting: Internal Medicine

## 2018-08-15 DIAGNOSIS — K219 Gastro-esophageal reflux disease without esophagitis: Secondary | ICD-10-CM | POA: Diagnosis not present

## 2018-08-15 DIAGNOSIS — J4540 Moderate persistent asthma, uncomplicated: Secondary | ICD-10-CM

## 2018-08-15 DIAGNOSIS — J309 Allergic rhinitis, unspecified: Secondary | ICD-10-CM

## 2018-08-15 DIAGNOSIS — J4520 Mild intermittent asthma, uncomplicated: Secondary | ICD-10-CM

## 2018-08-15 MED ORDER — TIOTROPIUM BROMIDE MONOHYDRATE 1.25 MCG/ACT IN AERS: 2.0000 | INHALATION_SPRAY | Freq: Two times a day (BID) | RESPIRATORY_TRACT | 5 refills | Status: DC

## 2018-08-15 MED ORDER — PREDNISONE 20 MG PO TABS: 20.0000 mg | ORAL_TABLET | Freq: Every day | ORAL | 0 refills | Status: DC

## 2018-08-15 NOTE — Progress Notes (Signed)
Name: Diane Williamson MRN: [id number] DOB: 07-08-1990     CONSULTATION DATE: 08/15/2018  REFERRING MD : Sherie Don  CHIEF COMPLAINT: asthma      VIDEO/TELEPHONE VISIT    In the setting of the current Covid19 crisis, you are scheduled for a  visit with me on 08/15/2018  Just as we do with many in-office visits, in order for you to participate in this visit, we must obtain consent.   I can obtain your verbal consent now.  PATIENT AGREES AND CONFIRMS -YES This Visit has Audio and Visual Capabilities for optimal patient care experience   Evaluation Performed:  Follow-up visit  This visit type was conducted due to national recommendations for restrictions regarding the COVID-19 Pandemic (e.g. social distancing).  This format is felt to be most appropriate for this patient at this time.  All issues noted in this document were discussed and addressed.     Virtual Visit via VIDEO Note     I connected with patient on 08/15/2018  by VIDEO and verified that I am speaking with the correct person using two identifiers.   I discussed the limitations, risks, security and privacy concerns of performing an evaluation and management service by telephone and the availability of in person appointments. I also discussed with the patient that there may be a patient responsible charge related to this service. The patient expressed understanding and agreed to proceed.   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 the work place does provide mask but she does not wear it Second job includes Manufacturing engineer   CC follow up ASTHMA  HISTORY OF PRESENT ILLNESS:  Started on [place] last OV-does NOT seem to help Has tried several other inhaled steroids and they do not help  She is exposed to dust at work wears maks She is exposed to her dog daily-which she is allergic to  She has intermittent wheezing and chest tightness No fevers, chills  GERD is much better Allergies much better     No signs of infection at this time No signs of exacerbation at this time  She does not have acute respiratory distress   Review of Systems:  Gen:  Denies  fever, sweats, chills weigh loss  HEENT: Denies blurred vision, double vision, ear pain, eye pain, hearing loss, nose bleeds, sore throat Cardiac:  No dizziness, chest pain or heaviness, chest tightness,edema, No JVD Resp: +cough +shortness of breath,+wheezing,  Gi: +GERD Gu:  Denies bladder incontinence, burning urine Ext:   Denies Joint pain, stiffness or swelling Skin: Denies  skin rash, easy bruising or bleeding or hives Endoc:  Denies polyuria, polydipsia , polyphagia or weight change Psych:   Denies depression, insomnia or hallucinations  Other:  All other systems negative    Review of Systems:  Gen:  Denies  fever, sweats, chills weigh loss  HEENT: Denies blurred vision, double vision, ear pain, eye pain, hearing loss, nose bleeds, sore throat Cardiac:  No dizziness, chest pain or heaviness, chest tightness,edema, No JVD Resp:   No cough, -sputum production, -shortness of breath,-wheezing, -hemoptysis,  Gi: Denies swallowing difficulty, stomach pain, nausea or vomiting, diarrhea, constipation, bowel incontinence Gu:  Denies  bladder incontinence, burning urine Ext:   Denies Joint pain, stiffness or swelling Skin: Denies  skin rash, easy bruising or bleeding or hives Endoc:  Denies polyuria, polydipsia , polyphagia or weight change Psych:   Denies depression, insomnia or hallucinations  Other:  All other systems negative    Physical Examination:  Well nourished, well developed female  in no acute distress. No Obvious Respiratory Distress noted EOMI intact, NO obvious oral lesions Facial Skin intact-no facial rash noted CN 3-12 intact   Insight, judgment intact. -depression -anxiety      PAST MEDICAL HISTORY :   has a past medical history of ADD (attention deficit disorder), Asthma, Cystitis, and Genital herpes.  has a past surgical history that includes No past surgeries. Prior to Admission medications   Medication Sig Start Date End Date Taking? Authorizing Provider  acyclovir (ZOVIRAX) 800 MG tablet Take 1 tablet (800 mg total) by mouth 3 (three) times daily. For two days with outbreaks; one daily for suppression 06/07/18  Yes Lada, Janit Bern, MD  albuterol (PROAIR HFA) 108 (90 Base) MCG/ACT inhaler Inhale 2 puffs into the lungs every 4 (four) hours as needed for wheezing or shortness of breath. 06/07/18  Yes Lada, Janit Bern, MD  EPINEPHrine (EPIPEN 2-PAK) 0.3 mg/0.3 mL IJ SOAJ injection Inject 0.3 mLs (0.3 mg total) into the muscle as needed for anaphylaxis. For life-threatening allergic or anaphylactic reaction 06/07/18  Yes Lada, Janit Bern, MD  fluticasone (FLOVENT HFA) 220 MCG/ACT inhaler Inhale 2 puffs into the lungs 2 (two) times daily. Until you see pulmonologist 06/07/18  Yes Lada, Janit Bern, MD  meclizine (ANTIVERT) 25 MG tablet Take 1 tablet (25 mg total) by mouth 3 (three) times daily as needed for dizziness. 08/17/17  Yes Cook, Jayce G, DO  montelukast (SINGULAIR) 10 MG tablet Take 1 tablet (10 mg total) by mouth at bedtime. 06/07/18  Yes Lada, Janit Bern, MD  nitrofurantoin, macrocrystal-monohydrate, (MACROBID) 100 MG capsule Take 1 capsule (100 mg total) by mouth 2 (two) times daily. 06/08/18  Yes Lada, Janit Bern, MD   Allergies  Allergen Reactions  . Benadryl Allergy [Diphenhydramine Hcl]     Rapid heart rate.   CBC    Component Value Date/Time   WBC 6.9 06/07/2018 1507   RBC 3.94 06/07/2018 1507   HGB 11.0 (L) 06/07/2018 1507   HGB 12.0 08/05/2013 1857    HCT 33.0 (L) 06/07/2018 1507   HCT 36.1 08/05/2013 1857   PLT 267 06/07/2018 1507   PLT 242 08/05/2013 1857   MCV 83.8 06/07/2018 1507   MCV 87 08/05/2013 1857   MCH 27.9 06/07/2018 1507   MCHC 33.3 06/07/2018 1507   RDW 13.8 06/07/2018 1507   RDW 12.8 08/05/2013 1857   LYMPHSABS 2,953 06/07/2018 1507   EOSABS 138 06/07/2018 1507   BASOSABS 21 06/07/2018 1507   BMP Latest Ref Rng & Units 06/07/2018 09/26/2017 08/05/2013  Glucose 65 - 99 mg/dL 85 161(W) 90  BUN 7 - 25 mg/dL Creatinine 0.50 - 1.10 mg/dL 9.60 4.54 0.98  BUN/Creat Ratio 6 - 22 (calc) NOT APPLICABLE - -  Sodium 135 - 146 mmol/L 140 137 137  Potassium 3.5 - 5.3 mmol/L 3.7 3.6 3.8  Chloride 98 - 110 mmol/L 108 104 104  CO2 20 - 32 mmol/L Calcium 8.6 - 10.2 mg/dL 9.1 8.9 8.6    IgE level 273 Eosinophil count 138   ASSESSMENT AND PLAN SYNOPSIS  28 yo obese  patient with chronic moderate persistent asthma with allergic rhinitis with constant exposure to dog allergen with GERD   I have strongly advised that patient wear a mask at work which most likely is contributing to her symptoms 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 I have strongly advised to avoid her dogs   Chronic moderate/persistent  asthma -Dulera(inhaled steroid and beta agonist) DOES NOT HELP -will start SPIRIVA 1.25 -Recommend using albuterol 2 to 4 puffs every 4 hours as needed Will give prednisone 20 mg daily for 7 days   Chronic allergic rhinitis -continue Zyrtec 10 mg daily -continue Nasacort   GERD -continueProtonix 40 mg daily for reflux   Avoidance of allergens especially her dog   TOTAL TIME SPENT 32 Minutes  COVID-19 Education: The signs and symptoms of COVID-19 were discussed with the patient and how to seek care for testing (follow up with PCP or arrange E-visit).  The importance of social distancing was discussed today.   Patient satisfied with Plan of action and management. All questions  answered  Follow up in 3 months  Chelsey Redondo Santiago Gladavid Anum Palecek, M.D.  [facility]  Medical Director [facility] Medical Director [facility]

## 2018-08-15 NOTE — Patient Instructions (Signed)
Switch to SPIRIVA RESPIMET Continue Albuterol as needed  Avoid allergens Avoid dogs   Continue Protonix and Zyrtec/Nasacort

## 2018-08-16 ENCOUNTER — Other Ambulatory Visit: Payer: Self-pay

## 2018-08-16 MED ORDER — PREDNISONE 20 MG PO TABS: 20.0000 mg | ORAL_TABLET | Freq: Every day | ORAL | 0 refills | Status: AC

## 2018-08-16 NOTE — Progress Notes (Signed)
Please see phone note from today. 

## 2018-08-16 NOTE — Telephone Encounter (Signed)
Rx for prednisone was written for 10 days, however dispense is only #7. Per office note, Rx should be for 7 days.  [facility], however they do not open until 9:00a.

## 2018-08-16 NOTE — Telephone Encounter (Signed)
Confirmed with Jasmine at [facility] pharmacy, prednisone should be 20mg for 7 days. 

## 2018-08-16 NOTE — Telephone Encounter (Signed)
7 days total

## 2018-09-03 ENCOUNTER — Encounter (INDEPENDENT_AMBULATORY_CARE_PROVIDER_SITE_OTHER): Payer: Self-pay | Admitting: Vascular Surgery

## 2018-09-03 ENCOUNTER — Ambulatory Visit (INDEPENDENT_AMBULATORY_CARE_PROVIDER_SITE_OTHER): Payer: BLUE CROSS/BLUE SHIELD | Admitting: Vascular Surgery

## 2018-09-03 ENCOUNTER — Other Ambulatory Visit: Payer: Self-pay

## 2018-09-03 VITALS — BP 125/79 | HR 91 | Resp 10 | Ht 70.0 in | Wt 259.0 lb

## 2018-09-03 DIAGNOSIS — K219 Gastro-esophageal reflux disease without esophagitis: Secondary | ICD-10-CM | POA: Diagnosis not present

## 2018-09-03 DIAGNOSIS — I83813 Varicose veins of bilateral lower extremities with pain: Secondary | ICD-10-CM

## 2018-09-03 DIAGNOSIS — Z79899 Other long term (current) drug therapy: Secondary | ICD-10-CM

## 2018-09-03 DIAGNOSIS — I872 Venous insufficiency (chronic) (peripheral): Secondary | ICD-10-CM | POA: Diagnosis not present

## 2018-09-03 NOTE — Progress Notes (Signed)
MRN : [id number]  Diane Williamson is a 28 y.o. (August 15, 1990) female who presents with chief complaint of  Chief Complaint  Patient presents with  . New Patient (Initial Visit)  .  History of Present Illness:   The patient is seen for evaluation of symptomatic varicose veins.  Right leg is worse than the left.  The patient relates burning and stinging which worsened steadily throughout the course of the day, particularly with standing. The patient also notes an aching and throbbing pain over the varicosities, particularly with prolonged dependent positions. The symptoms are significantly improved with elevation.  The patient also notes that during hot weather the symptoms are greatly intensified. The patient states the pain from the varicose veins interferes with work, daily exercise, shopping and household maintenance. At this point, the symptoms are persistent and severe enough that they're having a negative impact on lifestyle and are interfering with daily activities.  There is no history of DVT, PE or superficial thrombophlebitis. There is no history of ulceration or hemorrhage. The patient has a significant family history of varicose veins both maternal and paternal. OB history: P0  The patient has worn and continues to wear graduated compression in the past. At the present time the patient has not been using over-the-counter analgesics. There is no history of prior surgical intervention or sclerotherapy.    Current Meds  Medication Sig  . acyclovir (ZOVIRAX) 800 MG tablet Take 1 tablet (800 mg total) by mouth 3 (three) times daily. For two days with outbreaks; one daily for suppression  . albuterol (PROAIR HFA) 108 (90 Base) MCG/ACT inhaler Inhale 2 puffs into the lungs every 4 (four) hours as needed for wheezing or shortness of breath.  . cetirizine (ZYRTEC ALLERGY) 10 MG tablet Take 1 tablet (10 mg total) by mouth daily.  . montelukast (SINGULAIR) 10 MG tablet Take 1 tablet (10 mg  total) by mouth at bedtime.  . pantoprazole (PROTONIX) 40 MG tablet Take 1 tablet (40 mg total) by mouth daily.  . Tiotropium Bromide Monohydrate (SPIRIVA RESPIMAT) 1.25 MCG/ACT AERS Inhale 2 puffs into the lungs 2 (two) times daily.  Marland Kitchen triamcinolone (NASACORT) 55 MCG/ACT AERO nasal inhaler Place 1 spray into the nose 2 (two) times daily.    Past Medical History:  Diagnosis Date  . ADD (attention deficit disorder)   . Asthma   . Cystitis   . Genital herpes     Past Surgical History:  Procedure Laterality Date  . NO PAST SURGERIES      Social History Social History   Tobacco Use  . Smoking status: Former Smoker    Packs/day: 0.50    Years: 7.00    Pack years: 3.50    Types: Cigarettes    Last attempt to quit: 01/17/2016    Years since quitting: 2.6  . Smokeless tobacco: Never Used  Substance Use Topics  . Alcohol use: Not Currently  . Drug use: No    Family History Family History  Problem Relation Age of Onset  . ADD / ADHD Father   . Cancer Maternal Grandmother   . Heart disease Maternal Grandmother   . Diabetes Maternal Grandfather   . Diabetes Paternal Grandmother   . Atrial fibrillation Paternal Grandmother   . AAA (abdominal aortic aneurysm) Paternal Grandmother   . Heart attack Paternal Grandfather   . Varicose Veins Paternal Grandfather   No family history of bleeding/clotting disorders, porphyria or autoimmune disease   Allergies  Allergen Reactions  .  Benadryl Allergy [Diphenhydramine Hcl]     Rapid heart rate.     REVIEW OF SYSTEMS (Negative unless checked)  Constitutional: Weight loss  Fever  Chills Cardiac: Chest pain   Chest pressure   Palpitations   Shortness of breath when laying flat   Shortness of breath with exertion. Vascular:  Pain in legs with walking   Pain in legs at rest  History of DVT   Phlebitis   Swelling in legs   Varicose veins   Non-healing ulcers Pulmonary:   Uses home oxygen   Productive  cough   Hemoptysis   Wheeze  COPD   Asthma Neurologic:  Dizziness   Seizures   History of stroke   History of TIA  Aphasia   Vissual changes   Weakness or numbness in arm   Weakness or numbness in leg Musculoskeletal:   Joint swelling   Joint pain   Low back pain Hematologic:  Easy bruising  Easy bleeding   Hypercoagulable state   Anemic Gastrointestinal:  Diarrhea   Vomiting  Gastroesophageal reflux/heartburn   Difficulty swallowing. Genitourinary:  Chronic kidney disease   Difficult urination  Frequent urination   Blood in urine Skin:  Rashes   Ulcers  Psychological:  History of anxiety    History of major depression.  Physical Examination  Vitals:   09/03/18 1054  BP: 125/79  Pulse: 91  Resp: 10  Weight: 259 lb (117.5 kg)  Height:  (1.778 m)   Body mass index is 37.16 kg/m. Gen: WD/WN, NAD Head: [place]/AT, No temporalis wasting.  Ear/Nose/Throat: Hearing grossly intact, nares w/o erythema or drainage, poor dentition Eyes: PER, EOMI, sclera nonicteric.  Neck: Supple, no masses.  No bruit or JVD.  Pulmonary:  Good air movement, clear to auscultation bilaterally, no use of accessory muscles.  Cardiac: RRR, normal S1, S2, no Murmurs. Vascular: Large varicosities present extensively greater than 10 mm right leg.  Mild venous stasis changes to the legs bilaterally.  2+ soft pitting edema Vessel Right Left  PT Palpable Palpable  DP Palpable Palpable  Gastrointestinal: soft, non-distended. No guarding/no peritoneal signs.  Musculoskeletal: M/S 5/5 throughout.  No deformity or atrophy.  Neurologic: CN 2-12 intact. Pain and light touch intact in extremities.  Symmetrical.  Speech is fluent. Motor exam as listed above. Psychiatric: Judgment intact, Mood & affect appropriate for pt's clinical situation. Dermatologic: Mild venous rashes no ulcers noted.  No changes consistent with cellulitis. Lymph : No Cervical  lymphadenopathy, no lichenification or skin changes of chronic lymphedema.  CBC Lab Results  Component Value Date   WBC 6.9 06/07/2018   HGB 11.0 (L) 06/07/2018   HCT 33.0 (L) 06/07/2018   MCV 83.8 06/07/2018   PLT 267 06/07/2018    BMET    Component Value Date/Time   NA 140 06/07/2018 1507   NA 137 08/05/2013 1857   K 3.7 06/07/2018 1507   K 3.8 08/05/2013 1857   CL 108 06/07/2018 1507   CL 104 08/05/2013 1857   CO2 24 06/07/2018 1507   CO2 27 08/05/2013 1857   GLUCOSE 85 06/07/2018 1507   GLUCOSE 90 08/05/2013 1857   BUN 7 06/07/2018 1507   BUN 15 08/05/2013 1857   CREATININE 0.70 06/07/2018 1507   CALCIUM 9.1 06/07/2018 1507   CALCIUM 8.6 08/05/2013 1857   GFRNONAA 119 06/07/2018 1507   GFRAA 138 06/07/2018 1507   CrCl cannot be calculated (Patient's most recent lab result is older than the maximum 21 days allowed.).  COAG No results found for: INR, PROTIME  Radiology No results found.    Assessment/Plan 1. Varicose veins of both lower extremities with pain  Recommend:  The patient has large symptomatic varicose veins that are painful and associated with swelling.  I have had a long discussion with the patient regarding  varicose veins and why they cause symptoms.  Patient will begin wearing graduated compression stockings class 1 on a daily basis, beginning first thing in the morning and removing them in the evening. The patient is instructed specifically not to sleep in the stockings.    The patient  will also begin using over-the-counter analgesics such as Motrin 600 mg po TID to help control the symptoms.    In addition, behavioral modification including elevation during the day will be initiated.    Pending the results of these changes the  patient will be reevaluated in three months.   An  ultrasound of the venous system will be obtained.   Further plans will be based on the ultrasound results and whether conservative therapies are successful at  eliminating the pain and swelling.   - VAS US LOWER EXTREMITY VENOUS REFLUX; Future  2. Chronic venous insufficiency  Recommend:  The patient has large symptomatic varicose veins that are painful and associated with swelling.  I have had a long discussion with the patient regarding  varicose veins and why they cause symptoms.  Patient will begin wearing graduated compression stockings class 1 on a daily basis, beginning first thing in the morning and removing them in the evening. The patient is instructed specifically not to sleep in the stockings.    The patient  will also begin using over-the-counter analgesics such as Motrin 600 mg po TID to help control the symptoms.    In addition, behavioral modification including elevation during the day will be initiated.    Pending the results of these changes the  patient will be reevaluated in three months.   An  ultrasound of the venous system will be obtained.   Further plans will be based on the ultrasound results and whether conservative therapies are successful at eliminating the pain and swelling.   - VAS US LOWER EXTREMITY VENOUS REFLUX; Future  3. Gastroesophageal reflux disease, esophagitis presence not specified Continue PPI as already ordered, this medication has been reviewed and there are no changes at this time.  Avoidence of caffeine and alcohol  Moderate elevation of the head of the bed     Levora DredgeGregory Eustace Hur, MD  09/03/2018 11:21 AM

## 2018-09-07 ENCOUNTER — Other Ambulatory Visit: Payer: Self-pay

## 2018-09-07 MED ORDER — MONTELUKAST SODIUM 10 MG PO TABS: 10.0000 mg | ORAL_TABLET | Freq: Every day | ORAL | 3 refills | Status: DC

## 2018-09-13 DIAGNOSIS — R350 Frequency of micturition: Secondary | ICD-10-CM | POA: Diagnosis not present

## 2018-09-13 DIAGNOSIS — Z7721 Contact with and (suspected) exposure to potentially hazardous body fluids: Secondary | ICD-10-CM | POA: Diagnosis not present

## 2018-11-11 ENCOUNTER — Other Ambulatory Visit: Payer: Self-pay | Admitting: Family Medicine

## 2018-11-11 DIAGNOSIS — J45998 Other asthma: Secondary | ICD-10-CM

## 2018-11-18 DIAGNOSIS — N390 Urinary tract infection, site not specified: Secondary | ICD-10-CM | POA: Diagnosis not present

## 2018-11-18 DIAGNOSIS — N898 Other specified noninflammatory disorders of vagina: Secondary | ICD-10-CM | POA: Diagnosis not present

## 2018-11-18 DIAGNOSIS — R300 Dysuria: Secondary | ICD-10-CM | POA: Diagnosis not present

## 2018-12-03 ENCOUNTER — Ambulatory Visit: Payer: BLUE CROSS/BLUE SHIELD | Admitting: Family Medicine

## 2018-12-06 ENCOUNTER — Encounter (INDEPENDENT_AMBULATORY_CARE_PROVIDER_SITE_OTHER): Payer: Self-pay | Admitting: Vascular Surgery

## 2018-12-06 ENCOUNTER — Ambulatory Visit (INDEPENDENT_AMBULATORY_CARE_PROVIDER_SITE_OTHER): Payer: BC Managed Care – PPO

## 2018-12-06 ENCOUNTER — Ambulatory Visit (INDEPENDENT_AMBULATORY_CARE_PROVIDER_SITE_OTHER): Payer: BC Managed Care – PPO | Admitting: Vascular Surgery

## 2018-12-06 ENCOUNTER — Other Ambulatory Visit: Payer: Self-pay

## 2018-12-06 VITALS — BP 122/79 | HR 78 | Resp 10 | Ht 70.0 in | Wt 268.0 lb

## 2018-12-06 DIAGNOSIS — I83811 Varicose veins of right lower extremities with pain: Secondary | ICD-10-CM | POA: Diagnosis not present

## 2018-12-06 DIAGNOSIS — I83812 Varicose veins of left lower extremities with pain: Secondary | ICD-10-CM

## 2018-12-06 DIAGNOSIS — I83813 Varicose veins of bilateral lower extremities with pain: Secondary | ICD-10-CM

## 2018-12-06 DIAGNOSIS — I872 Venous insufficiency (chronic) (peripheral): Secondary | ICD-10-CM

## 2018-12-06 DIAGNOSIS — K219 Gastro-esophageal reflux disease without esophagitis: Secondary | ICD-10-CM

## 2018-12-06 NOTE — Progress Notes (Signed)
MRN : [id number]  Diane Williamson is a 28 y.o. (January 03, 1991) female who presents with chief complaint of  Chief Complaint  Patient presents with  . Follow-up  .  History of Present Illness:   The patient returns for followup evaluation 3 months after the initial visit. The patient continues to have pain in the lower extremities with dependency. The pain is lessened with elevation. Graduated compression stockings, Class I (20-30 mmHg), have been worn but the stockings do not eliminate the leg pain. Over-the-counter analgesics do not improve the symptoms. The degree of discomfort continues to interfere with daily activities. The patient notes the pain in the legs is causing problems with daily exercise, at the workplace and even with household activities and maintenance such as standing in the kitchen preparing meals and doing dishes.   Venous ultrasound shows normal deep venous system, no evidence of acute or chronic DVT.  Superficial reflux is present in the right great saphenous vein  Current Meds  Medication Sig  . acyclovir (ZOVIRAX) 800 MG tablet Take 1 tablet (800 mg total) by mouth 3 (three) times daily. For two days with outbreaks; one daily for suppression  . cetirizine (ZYRTEC ALLERGY) 10 MG tablet Take 1 tablet (10 mg total) by mouth daily.  Marland Kitchen. EPINEPHrine (EPIPEN 2-PAK) 0.3 mg/0.3 mL IJ SOAJ injection Inject 0.3 mLs (0.3 mg total) into the muscle as needed for anaphylaxis. For life-threatening allergic or anaphylactic reaction  . montelukast (SINGULAIR) 10 MG tablet Take 1 tablet (10 mg total) by mouth at bedtime.  Marland Kitchen. NUVARING 0.12-0.015 MG/24HR vaginal ring INSERT ONE RING INTO THE VAGINA FOR THREE WEEKS. REMOVE FOR SEVEN DAYS FOR MENSES  . pantoprazole (PROTONIX) 40 MG tablet Take 1 tablet (40 mg total) by mouth daily.  Marland Kitchen. PROAIR HFA 108 (90 Base) MCG/ACT inhaler INHALE 2 PUFFS BY MOUTH EVERY 4 HOURS AS NEEDED FOR WHEEZING FOR SHORTNESS OF BREATH  . Tiotropium Bromide Monohydrate  (SPIRIVA RESPIMAT) 1.25 MCG/ACT AERS Inhale 2 puffs into the lungs 2 (two) times daily.  Marland Kitchen. triamcinolone (NASACORT) 55 MCG/ACT AERO nasal inhaler Place 1 spray into the nose 2 (two) times daily.    Past Medical History:  Diagnosis Date  . ADD (attention deficit disorder)   . Asthma   . Cystitis   . Genital herpes     Past Surgical History:  Procedure Laterality Date  . NO PAST SURGERIES      Social History Social History   Tobacco Use  . Smoking status: Former Smoker    Packs/day: 0.50    Years: 7.00    Pack years: 3.50    Types: Cigarettes    Quit date: 01/17/2016    Years since quitting: 2.8  . Smokeless tobacco: Never Used  Substance Use Topics  . Alcohol use: Not Currently  . Drug use: No    Family History Family History  Problem Relation Age of Onset  . ADD / ADHD Father   . Cancer Maternal Grandmother   . Heart disease Maternal Grandmother   . Diabetes Maternal Grandfather   . Diabetes Paternal Grandmother   . Atrial fibrillation Paternal Grandmother   . AAA (abdominal aortic aneurysm) Paternal Grandmother   . Heart attack Paternal Grandfather   . Varicose Veins Paternal Grandfather     Allergies  Allergen Reactions  . Benadryl Allergy [Diphenhydramine Hcl]     Rapid heart rate.     REVIEW OF SYSTEMS (Negative unless checked)  Constitutional: [] Weight loss  [] Fever  [] Chills  Cardiac: [] Chest pain   [] Chest pressure   [] Palpitations   [] Shortness of breath when laying flat   [] Shortness of breath with exertion. Vascular:  [] Pain in legs with walking   [x] Pain in legs at rest  [] History of DVT   [] Phlebitis   [x] Swelling in legs   [x] Varicose veins   [] Non-healing ulcers Pulmonary:   [] Uses home oxygen   [] Productive cough   [] Hemoptysis   [] Wheeze  [] COPD   [] Asthma Neurologic:  [] Dizziness   [] Seizures   [] History of stroke   [] History of TIA  [] Aphasia   [] Vissual changes   [] Weakness or numbness in arm   [] Weakness or numbness in leg  Musculoskeletal:   [] Joint swelling   [] Joint pain   [] Low back pain Hematologic:  [] Easy bruising  [] Easy bleeding   [] Hypercoagulable state   [] Anemic Gastrointestinal:  [] Diarrhea   [] Vomiting  [] Gastroesophageal reflux/heartburn   [] Difficulty swallowing. Genitourinary:  [] Chronic kidney disease   [] Difficult urination  [] Frequent urination   [] Blood in urine Skin:  [] Rashes   [] Ulcers  Psychological:  [] History of anxiety   []  History of major depression.  Physical Examination  Vitals:   12/06/18 1357  BP: 122/79  Pulse: 78  Resp: 10  Weight: 268 lb (121.6 kg)  Height: 5\' 10"  (1.778 m)   Body mass index is 38.45 kg/m. Gen: WD/WN, NAD Head: [place]/AT, No temporalis wasting.  Ear/Nose/Throat: Hearing grossly intact, nares w/o erythema or drainage Eyes: PER, EOMI, sclera nonicteric.  Neck: Supple, no large masses.   Pulmonary:  Good air movement, no audible wheezing bilaterally, no use of accessory muscles.  Cardiac: RRR, no JVD Vascular: Large varicosities present extensively greater than 10 mm right leg.  Mild venous stasis changes to the legs bilaterally.  1-2+ soft pitting edema Vessel Right Left  Radial Palpable Palpable  PT Palpable Palpable  DP Palpable Palpable  Gastrointestinal: Non-distended. No guarding/no peritoneal signs.  Musculoskeletal: M/S 5/5 throughout.  No deformity or atrophy.  Neurologic: CN 2-12 intact. Symmetrical.  Speech is fluent. Motor exam as listed above. Psychiatric: Judgment intact, Mood & affect appropriate for pt's clinical situation. Dermatologic: Mild venous rashes no ulcers noted.  No changes consistent with cellulitis. Lymph : No lichenification or skin changes of chronic lymphedema.  CBC Lab Results  Component Value Date   WBC 6.9 06/07/2018   HGB 11.0 (L) 06/07/2018   HCT 33.0 (L) 06/07/2018   MCV 83.8 06/07/2018   PLT 267 06/07/2018    BMET    Component Value Date/Time   NA 140 06/07/2018 1507   NA 137 08/05/2013 1857   K  3.7 06/07/2018 1507   K 3.8 08/05/2013 1857   CL 108 06/07/2018 1507   CL 104 08/05/2013 1857   CO2 24 06/07/2018 1507   CO2 27 08/05/2013 1857   GLUCOSE 85 06/07/2018 1507   GLUCOSE 90 08/05/2013 1857   BUN 7 06/07/2018 1507   BUN 15 08/05/2013 1857   CREATININE 0.70 06/07/2018 1507   CALCIUM 9.1 06/07/2018 1507   CALCIUM 8.6 08/05/2013 1857   GFRNONAA 119 06/07/2018 1507   GFRAA 138 06/07/2018 1507   CrCl cannot be calculated (Patient's most recent lab result is older than the maximum 21 days allowed.).  COAG No results found for: INR, PROTIME  Radiology No results found.   Assessment/Plan 1. Varicose veins of both lower extremities with pain Recommend  I have reviewed my previous  discussion with the patient regarding  varicose veins and why they cause  symptoms. Patient will continue  wearing graduated compression stockings class 1 on a daily basis, beginning first thing in the morning and removing them in the evening.    In addition, behavioral modification including elevation during the day was again discussed and this will continue.  The patient has utilized over the counter pain medications and has been exercising.  However, at this time conservative therapy has not alleviated the patient's symptoms of leg pain and swelling  Recommend: laser ablation of the right great saphenous vein to eliminate the symptoms of pain and swelling of the lower extremities caused by the severe superficial venous reflux disease.   2. Chronic venous insufficiency No surgery or intervention at this point in time.    I have had a long discussion with the patient regarding venous insufficiency and why it  causes symptoms. I have discussed with the patient the chronic skin changes that accompany venous insufficiency and the long term sequela such as infection and ulceration.  Patient will begin wearing graduated compression stockings class 1 (20-30 mmHg) or compression wraps on a daily basis  a prescription was given. The patient will put the stockings on first thing in the morning and removing them in the evening. The patient is instructed specifically not to sleep in the stockings.    In addition, behavioral modification including several periods of elevation of the lower extremities during the day will be continued. I have demonstrated that proper elevation is a position with the ankles at heart level.  The patient is instructed to begin routine exercise, especially walking on a daily basis   3. Gastroesophageal reflux disease, esophagitis presence not specified Continue PPI as already ordered, this medication has been reviewed and there are no changes at this time.  Avoidence of caffeine and alcohol  Moderate elevation of the head of the bed   Hortencia Pilar, MD  12/06/2018 3:01 PM

## 2018-12-09 ENCOUNTER — Other Ambulatory Visit: Payer: Self-pay | Admitting: Family Medicine

## 2018-12-13 ENCOUNTER — Telehealth: Payer: Self-pay | Admitting: Physician Assistant

## 2018-12-13 NOTE — Telephone Encounter (Signed)
Call to pharmacy and OKed refills for Nuva Ring x 3.  Per chart review patient had last RP on 08/29/2017 and was oked refills on 09/11/18 x 3. Due to COVID restrictions will ok 3 more refills.

## 2018-12-31 ENCOUNTER — Other Ambulatory Visit: Payer: Self-pay

## 2018-12-31 ENCOUNTER — Ambulatory Visit (INDEPENDENT_AMBULATORY_CARE_PROVIDER_SITE_OTHER): Payer: BC Managed Care – PPO | Admitting: Family Medicine

## 2018-12-31 ENCOUNTER — Encounter: Payer: Self-pay | Admitting: Family Medicine

## 2018-12-31 ENCOUNTER — Other Ambulatory Visit (HOSPITAL_COMMUNITY)
Admission: RE | Admit: 2018-12-31 | Discharge: 2018-12-31 | Disposition: A | Discharge status: Home / Self Care | Payer: BC Managed Care – PPO | Source: Ambulatory Visit | Attending: Family Medicine | Admitting: Family Medicine

## 2018-12-31 VITALS — BP 136/80 | HR 66 | Temp 97.9°F | Resp 14 | Ht 70.0 in | Wt 263.1 lb

## 2018-12-31 DIAGNOSIS — Z202 Contact with and (suspected) exposure to infections with a predominantly sexual mode of transmission: Secondary | ICD-10-CM | POA: Insufficient documentation

## 2018-12-31 DIAGNOSIS — R399 Unspecified symptoms and signs involving the genitourinary system: Secondary | ICD-10-CM

## 2018-12-31 DIAGNOSIS — M79671 Pain in right foot: Secondary | ICD-10-CM

## 2018-12-31 DIAGNOSIS — K219 Gastro-esophageal reflux disease without esophagitis: Secondary | ICD-10-CM

## 2018-12-31 DIAGNOSIS — Z833 Family history of diabetes mellitus: Secondary | ICD-10-CM

## 2018-12-31 DIAGNOSIS — M79672 Pain in left foot: Secondary | ICD-10-CM | POA: Diagnosis not present

## 2018-12-31 DIAGNOSIS — J4550 Severe persistent asthma, uncomplicated: Secondary | ICD-10-CM | POA: Diagnosis not present

## 2018-12-31 DIAGNOSIS — Z3044 Encounter for surveillance of vaginal ring hormonal contraceptive device: Secondary | ICD-10-CM | POA: Diagnosis not present

## 2018-12-31 LAB — POCT URINALYSIS DIPSTICK
Bilirubin, UA: NEGATIVE
Blood, UA: NEGATIVE
Glucose, UA: NEGATIVE
Ketones, UA: NEGATIVE
Leukocytes, UA: NEGATIVE
Nitrite, UA: NEGATIVE
Odor: NORMAL
Protein, UA: NEGATIVE
Spec Grav, UA: 1.02 (ref 1.010–1.025)
Urobilinogen, UA: 0.2 E.U./dL
pH, UA: 5.5 (ref 5.0–8.0)

## 2018-12-31 MED ORDER — MONTELUKAST SODIUM 10 MG PO TABS: 10.0000 mg | ORAL_TABLET | Freq: Every day | ORAL | 0 refills | Status: DC

## 2018-12-31 MED ORDER — NUVARING 0.12-0.015 MG/24HR VA RING: VAGINAL_RING | VAGINAL | 3 refills | Status: DC

## 2018-12-31 MED ORDER — NEBULIZER/TUBING/MOUTHPIECE KIT: PACK | 0 refills | Status: DC

## 2018-12-31 MED ORDER — PANTOPRAZOLE SODIUM 40 MG PO TBEC: 40.0000 mg | DELAYED_RELEASE_TABLET | Freq: Every day | ORAL | 1 refills | Status: DC

## 2018-12-31 MED ORDER — IPRATROPIUM-ALBUTEROL 0.5-2.5 (3) MG/3ML IN SOLN: 3.0000 mL | RESPIRATORY_TRACT | 0 refills | Status: DC | PRN

## 2018-12-31 NOTE — Progress Notes (Signed)
Name: Diane Williamson   MRN: [id number]    DOB: 07/11/1990   Date:12/31/2018       Progress Note  Chief Complaint  Patient presents with  . Follow-up  . Gastroesophageal Reflux  . Asthma  . Urinary Tract Infection    pressure/pain  . Contraception    refill     Subjective:   Diane Williamson is a 28 y.o. female, presents to clinic for routine follow up on the conditions listed above.  GERD:   On protonix 40 mg PRN using about 2x a week. PRN, flared up by certain foods.  She denies abd pain, N, indigestion,   Asthma:   12/31/18 1420  Asthma History  Symptoms Throughout the day (at least 4x per d)  Nighttime Awakenings Often--7/wk (2x a night)  Asthma interference with normal activity Some limitations  SABA use (not for EIB) Several times/day  Risk: Exacerbations requiring oral systemic steroids 0-1 / year  Asthma Severity Severe Persistent  Pt is on singulair,  spiriva and SABA, using SABA multiple times a day and several times a night.  She also is concerned with some dysuria, she wants to be checked for UTI and STD.   She is sexually active with her boyfriend, they recently "went on a break" and he had sex with another person and both the pt and boyfriend have tested + for and were treated for chlamydia with recurrence and retesting and retreating.  Since they have been back together they have had intercourse again and she was concerned that mild urinary sx may be STDs again.  She denies dyspareunia, vaginal discharge, genital rash, general swelling or lesions, pelvic pain, abdominal pain, nausea, vomiting.  She is having normal withdrawal bleeds/menses.  UTI Urinary Tract Infection  This is a new problem. Episode onset: 1.5 weeks. The problem has been unchanged. The quality of the pain is described as burning. The pain is mild. She is sexually active. There is no history of pyelonephritis. Associated symptoms include flank pain, frequency and urgency. Pertinent negatives  include no chills, discharge, hematuria, hesitancy, nausea, possible pregnancy, sweats or vomiting. The treatment provided no relief.    Pt also needs refill on nuva ring Birth control follow up: Using nuva ring.  Good compliance without gaps in use.  (used for the past year consistently and on and off prior to that) Is currently sexually acitve, she has not concerns or sx with the nuva ring BP good today Denies LE edema, tachycardia. Former smoker, denies use right now. PAP UTD, no vaginal complaints  Pt also wants to be checked for DM, is concerned that maybe she has it.  Has recent urinary frequency, no nocturia, polydipsia, fatigue, weight loss, polyphagia.  She also wants to be check for DM due to family hx and some intermittent pain in her feet.  Patient Active Problem List   Diagnosis Date Noted  . Chronic venous insufficiency 09/03/2018  . Eczema 07/03/2018  . Allergic rhinitis 07/03/2018  . GERD (gastroesophageal reflux disease) 07/03/2018  . Vitamin D deficiency 07/03/2018  . Obesity (BMI 35.0-39.9 without comorbidity) 06/07/2018  . Uncontrolled persistent asthma 06/07/2018  . Anemia 06/07/2018  . Hematuria, microscopic 06/07/2018  . ADHD (attention deficit hyperactivity disorder), 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/2018  . Varicose veins of both lower extremities with pain 06/07/2018    Past Surgical History:  Procedure Laterality Date  . NO PAST SURGERIES      Family History  Problem Relation Age of Onset  .  ADD / ADHD Father   . Cancer Maternal Grandmother   . Heart disease Maternal Grandmother   . Diabetes Maternal Grandfather   . Diabetes Paternal Grandmother   . Atrial fibrillation Paternal Grandmother   . AAA (abdominal aortic aneurysm) Paternal Grandmother   . Heart attack Paternal Grandfather   . Varicose Veins Paternal Grandfather     Social History   Socioeconomic History  . Marital status: Legally Separated    Spouse name: Not on file  . Number of  children: Not on file  . Years of education: 29  . Highest education level: Some college, no degree  Occupational History  . Not on file  Social Needs  . Financial resource strain: Not hard at all  . Food insecurity    Worry: Never true    Inability: Never true  . Transportation needs    Medical: No    Non-medical: No  Tobacco Use  . Smoking status: Former Smoker    Packs/day: 0.50    Years: 7.00    Pack years: 3.50    Types: Cigarettes    Quit date: 01/17/2016    Years since quitting: 2.9  . Smokeless tobacco: Never Used  Substance and Sexual Activity  . Alcohol use: Not Currently  . Drug use: No  . Sexual activity: Not Currently  Lifestyle  . Physical activity    Days per week: 3 days    Minutes per session: 60 min  . Stress: Not at all  Relationships  . Social Herbalist on phone: Once a week    Gets together: Once a week    Attends religious service: Never    Active member of club or organization: No    Attends meetings of clubs or organizations: Never    Relationship status: Separated  . Intimate partner violence    Fear of current or ex partner: No    Emotionally abused: No    Physically abused: No    Forced sexual activity: No  Other Topics Concern  . Not on file  Social History Narrative  . Not on file     Current Outpatient Medications:  .  acyclovir (ZOVIRAX) 800 MG tablet, Take 1 tablet (800 mg total) by mouth 3 (three) times daily. For two days with outbreaks; one daily for suppression, Disp: 38 tablet, Rfl: 5 .  cetirizine (ZYRTEC ALLERGY) 10 MG tablet, Take 1 tablet (10 mg total) by mouth daily., Disp: 30 tablet, Rfl: 6 .  EPINEPHrine (EPIPEN 2-PAK) 0.3 mg/0.3 mL IJ SOAJ injection, Inject 0.3 mLs (0.3 mg total) into the muscle as needed for anaphylaxis. For life-threatening allergic or anaphylactic reaction, Disp: 1 Device, Rfl: 1 .  montelukast (SINGULAIR) 10 MG tablet, TAKE 1 TABLET BY MOUTH AT BEDTIME, Disp: 90 tablet, Rfl: 0 .   NUVARING 0.12-0.015 MG/24HR vaginal ring, INSERT ONE RING INTO THE VAGINA FOR THREE WEEKS. REMOVE FOR SEVEN DAYS FOR MENSES, Disp: , Rfl:  .  pantoprazole (PROTONIX) 40 MG tablet, Take 1 tablet (40 mg total) by mouth daily., Disp: 30 tablet, Rfl: 1 .  PROAIR HFA 108 (90 Base) MCG/ACT inhaler, INHALE 2 PUFFS BY MOUTH EVERY 4 HOURS AS NEEDED FOR WHEEZING FOR SHORTNESS OF BREATH, Disp: 9 g, Rfl: 0 .  Tiotropium Bromide Monohydrate (SPIRIVA RESPIMAT) 1.25 MCG/ACT AERS, Inhale 2 puffs into the lungs 2 (two) times daily., Disp: 1 Inhaler, Rfl: 5 .  triamcinolone (NASACORT) 55 MCG/ACT AERO nasal inhaler, Place 1 spray into the nose 2 (  two) times daily., Disp: 1 Inhaler, Rfl: 2  Allergies  Allergen Reactions  . Benadryl Allergy [Diphenhydramine Hcl]     Rapid heart rate.    I personally reviewed active problem list, medication list, allergies, family history, social history, health maintenance, notes from last encounter, lab results, imaging with the patient/caregiver today.  Review of Systems  Constitutional: Negative.  Negative for activity change, appetite change, chills, diaphoresis, fatigue and fever.  HENT: Negative.   Eyes: Negative.   Respiratory: Negative.   Cardiovascular: Negative.   Gastrointestinal: Negative.  Negative for nausea and vomiting.  Endocrine: Negative.   Genitourinary: Positive for dysuria, flank pain, frequency and urgency. Negative for decreased urine volume, difficulty urinating, dyspareunia, enuresis, hematuria, hesitancy, menstrual problem, pelvic pain, vaginal bleeding, vaginal discharge and vaginal pain.  Skin: Negative.   Allergic/Immunologic: Negative.   Neurological: Negative.   Hematological: Negative.   Psychiatric/Behavioral: Negative.   All other systems reviewed and are negative.    Objective:    Vitals:   12/31/18 1400  BP: 136/80  Pulse: 66  Resp: 14  Temp: 97.9 F (36.6 C)  SpO2: 99%  Weight: 263 lb 1.6 oz (119.3 kg)  Height: '5\' 10"'   (1.778 m)    Body mass index is 37.75 kg/m.  Physical Exam Vitals signs and nursing note reviewed.  Constitutional:      General: She is not in acute distress.    Appearance: Normal appearance. She is well-developed. She is obese. She is not ill-appearing, toxic-appearing or diaphoretic.  HENT:     Head: Normocephalic and atraumatic.     Right Ear: External ear normal.     Left Ear: External ear normal.     Nose: Nose normal.     Mouth/Throat:     Mouth: Mucous membranes are moist.     Pharynx: Oropharynx is clear. Uvula midline. No oropharyngeal exudate or posterior oropharyngeal erythema.  Eyes:     General: Lids are normal. No scleral icterus.       Right eye: No discharge.        Left eye: No discharge.     Conjunctiva/sclera: Conjunctivae normal.     Pupils: Pupils are equal, round, and reactive to light.  Neck:     Musculoskeletal: Neck supple.     Trachea: Phonation normal. No tracheal deviation.  Cardiovascular:     Rate and Rhythm: Normal rate and regular rhythm.     Pulses: Normal pulses.          Radial pulses are 2+ on the right side and 2+ on the left side.       Posterior tibial pulses are 2+ on the right side and 2+ on the left side.     Heart sounds: Normal heart sounds. No murmur. No friction rub. No gallop.   Pulmonary:     Effort: Pulmonary effort is normal. No respiratory distress.     Breath sounds: Normal breath sounds. No stridor. No wheezing, rhonchi or rales.     Comments: CTA A&P, symmetrical chest expansion, no retraction, no accessory muscle use, no wheeze on exam Chest:     Chest wall: No tenderness.  Abdominal:     General: Bowel sounds are normal. There is no distension.     Palpations: Abdomen is soft.     Tenderness: There is no abdominal tenderness. There is no right CVA tenderness, left CVA tenderness, guarding or rebound.  Musculoskeletal: Normal range of motion.        General: No deformity.  Right lower leg: No edema.     Left  lower leg: No edema.  Skin:    General: Skin is warm and dry.     Capillary Refill: Capillary refill takes less than 2 seconds.     Coloration: Skin is not jaundiced or pale.     Findings: No bruising or rash.  Neurological:     Mental Status: She is alert and oriented to person, place, and time.     Sensory: No sensory deficit.     Motor: No weakness or abnormal muscle tone.     Gait: Gait normal.  Psychiatric:        Mood and Affect: Mood normal.        Speech: Speech normal.        Behavior: Behavior normal.      POC UA neg No pelvic exam- pt did vaginal self swab  PHQ2/9: Depression screen [facility] 2/9 12/31/2018 07/03/2018 06/07/2018  Decreased Interest 0 0 0  Down, Depressed, Hopeless 0 0 1  PHQ - 2 Score 0 0 1  Altered sleeping 0 2 3  Tired, decreased energy 0 1 0  Change in appetite 0 0 1  Feeling bad or failure about yourself  0 0 3  Trouble concentrating 0 1 3  Moving slowly or fidgety/restless 0 0 0  Suicidal thoughts 0 0 0  PHQ-9 Score 0 4 11  Difficult doing work/chores - Somewhat difficult Somewhat difficult    phq 9 is negative reviewed  Fall Risk: Fall Risk  12/31/2018 06/07/2018  Falls in the past year? 0 0  Number falls in past yr: 0 0  Injury with Fall? 0 0    Functional Status Survey: Is the patient deaf or have difficulty hearing?: No Does the patient have difficulty seeing, even when wearing glasses/contacts?: No Does the patient have difficulty concentrating, remembering, or making decisions?: No Does the patient have difficulty walking or climbing stairs?: No Does the patient have difficulty dressing or bathing?: No Does the patient have difficulty doing errands alone such as visiting a doctor's office or shopping?: No   Assessment & Plan:     ICD-10-CM   1. Gastroesophageal reflux disease without esophagitis  K21.9 pantoprazole (PROTONIX) 40 MG tablet   continue PRN use, avoid food triggers  2. Encounter for surveillance of vaginal ring  hormonal contraceptive device  Z30.44 NUVARING 0.12-0.015 MG/24HR vaginal ring   refills given, BP good, PAP UTD  3. Severe persistent asthma, unspecified whether complicated  K99.83 Respiratory Therapy Supplies (NEBULIZER/TUBING/MOUTHPIECE) KIT    montelukast (SINGULAIR) 10 MG tablet    ipratropium-albuterol (DUONEB) 0.5-2.5 (3) MG/3ML SOLN   uncontrolled needs increased maintenance therapy -  Will see coverage, for now nebulizer machine with duonebs BID-TID PRN, avoid triggers  4. UTI symptoms  R39.9 POCT urinalysis dipstick    Cytology (oral, anal, urethral) ancillary only   UA neg, screening for STD's  5. Exposure to STD  Z20.2 Cytology (oral, anal, urethral) ancillary only    HIV antibody (with reflex)    RPR    Rpr titer    Fluorescent treponemal ab(fta)-IgG-bld   agrees to blood HIV and RPR, vaginal swab for gonorrhea, chlamydia, trich, will tx any positives  6. Pain in both feet  J82.505 COMPLETE METABOLIC PANEL WITH GFR   M79.672 Hemoglobin A1c   wants screening for DM - A1C and CMP  7. Family history of diabetes mellitus (DM)  L97.6 COMPLETE METABOLIC PANEL WITH GFR    Hemoglobin A1c  Asthma - pt referred back to pulmonology, her exam is unremarkable and slightly inconsistent with her given hx, duoneb will help, hopefully with nighttime sx.  SOB may have other causes - GERD - though she feels its well controlled, OSA vs OHS     Delsa Grana, PA-C 12/31/18 2:11 PM

## 2019-01-01 LAB — COMPLETE METABOLIC PANEL WITH GFR
AG Ratio: 1.5 (calc) (ref 1.0–2.5)
ALT: 13 U/L (ref 6–29)
AST: 15 U/L (ref 10–30)
Albumin: 4.1 g/dL (ref 3.6–5.1)
Alkaline phosphatase (APISO): 40 U/L (ref 31–125)
BUN: 11 mg/dL (ref 7–25)
CO2: 24 mmol/L (ref 20–32)
Calcium: 9.2 mg/dL (ref 8.6–10.2)
Chloride: 107 mmol/L (ref 98–110)
Creat: 0.93 mg/dL (ref 0.50–1.10)
GFR, Est African American: 97 mL/min/{1.73_m2} (ref >=60)
GFR, Est Non African American: 84 mL/min/{1.73_m2} (ref >=60)
Globulin: 2.8 g/dL (calc) (ref 1.9–3.7)
Glucose, Bld: 96 mg/dL (ref 65–99)
Potassium: 4 mmol/L (ref 3.5–5.3)
Sodium: 140 mmol/L (ref 135–146)
Total Bilirubin: 0.3 mg/dL (ref 0.2–1.2)
Total Protein: 6.9 g/dL (ref 6.1–8.1)

## 2019-01-01 LAB — HEMOGLOBIN A1C
Hgb A1c MFr Bld: 5.3 % of total Hgb (ref <5.7)
Mean Plasma Glucose: 105 (calc)
eAG (mmol/L): 5.8 (calc)

## 2019-01-01 LAB — RPR: RPR Ser Ql: REACTIVE — AB

## 2019-01-01 LAB — FLUORESCENT TREPONEMAL AB(FTA)-IGG-BLD: Fluorescent Treponemal ABS: NONREACTIVE

## 2019-01-01 LAB — HIV ANTIBODY (ROUTINE TESTING W REFLEX): HIV 1&2 Ab, 4th Generation: NONREACTIVE

## 2019-01-01 LAB — RPR TITER: RPR Titer: 1:1 {titer} — ABNORMAL HIGH

## 2019-01-02 ENCOUNTER — Encounter: Payer: Self-pay | Admitting: Family Medicine

## 2019-01-02 LAB — CYTOLOGY, (ORAL, ANAL, URETHRAL) ANCILLARY ONLY
Chlamydia: NEGATIVE
Neisseria Gonorrhea: NEGATIVE
Trichomonas: NEGATIVE

## 2019-01-17 ENCOUNTER — Ambulatory Visit (INDEPENDENT_AMBULATORY_CARE_PROVIDER_SITE_OTHER): Payer: BC Managed Care – PPO | Admitting: Vascular Surgery

## 2019-01-17 ENCOUNTER — Encounter (INDEPENDENT_AMBULATORY_CARE_PROVIDER_SITE_OTHER): Payer: Self-pay | Admitting: Vascular Surgery

## 2019-01-17 ENCOUNTER — Other Ambulatory Visit: Payer: Self-pay

## 2019-01-17 VITALS — BP 129/85 | HR 78 | Resp 16 | Wt 269.0 lb

## 2019-01-17 DIAGNOSIS — I83811 Varicose veins of right lower extremities with pain: Secondary | ICD-10-CM | POA: Diagnosis not present

## 2019-01-17 DIAGNOSIS — I83813 Varicose veins of bilateral lower extremities with pain: Secondary | ICD-10-CM

## 2019-01-17 NOTE — Progress Notes (Signed)
    MRN : [id number]  Diane Williamson is a 28 y.o. (19-Jun-1990) female who presents with chief complaint of No chief complaint on file. .    The patient's right lower extremity was sterilely prepped and draped.  The ultrasound machine was used to visualize the right great saphenous vein throughout its course.  A segment at the knee was selected for access.  The saphenous vein was accessed without difficulty using ultrasound guidance with a micropuncture needle.   An 0.018  wire was placed beyond the saphenofemoral junction through the sheath and the microneedle was removed.  The 65 cm sheath was then placed over the wire and the wire and dilator were removed.  The laser fiber was placed through the sheath and its tip was placed approximately 2 cm below the saphenofemoral junction.  Tumescent anesthesia was then created with a dilute lidocaine solution.  Laser energy was then delivered with constant withdrawal of the sheath and laser fiber.  Approximately 1253 Joules of energy were delivered over a length of 37 cm.  Sterile dressings were placed.  The patient tolerated the procedure well without complications.

## 2019-01-21 ENCOUNTER — Other Ambulatory Visit (INDEPENDENT_AMBULATORY_CARE_PROVIDER_SITE_OTHER): Payer: Self-pay | Admitting: Vascular Surgery

## 2019-01-21 ENCOUNTER — Ambulatory Visit (INDEPENDENT_AMBULATORY_CARE_PROVIDER_SITE_OTHER): Payer: BC Managed Care – PPO

## 2019-01-21 ENCOUNTER — Other Ambulatory Visit: Payer: Self-pay

## 2019-01-21 DIAGNOSIS — Z09 Encounter for follow-up examination after completed treatment for conditions other than malignant neoplasm: Secondary | ICD-10-CM

## 2019-02-06 ENCOUNTER — Other Ambulatory Visit: Payer: Self-pay

## 2019-02-06 DIAGNOSIS — J45998 Other asthma: Secondary | ICD-10-CM

## 2019-02-06 MED ORDER — PROAIR HFA 108 (90 BASE) MCG/ACT IN AERS: INHALATION_SPRAY | RESPIRATORY_TRACT | 0 refills | Status: DC

## 2019-02-14 ENCOUNTER — Encounter (INDEPENDENT_AMBULATORY_CARE_PROVIDER_SITE_OTHER): Payer: Self-pay | Admitting: Nurse Practitioner

## 2019-02-14 ENCOUNTER — Ambulatory Visit (INDEPENDENT_AMBULATORY_CARE_PROVIDER_SITE_OTHER): Payer: BC Managed Care – PPO | Admitting: Nurse Practitioner

## 2019-02-14 ENCOUNTER — Other Ambulatory Visit: Payer: Self-pay

## 2019-02-14 ENCOUNTER — Encounter (INDEPENDENT_AMBULATORY_CARE_PROVIDER_SITE_OTHER): Payer: Self-pay

## 2019-02-14 VITALS — BP 124/78 | HR 89 | Resp 16 | Ht 70.0 in | Wt 266.0 lb

## 2019-02-14 DIAGNOSIS — K219 Gastro-esophageal reflux disease without esophagitis: Secondary | ICD-10-CM | POA: Diagnosis not present

## 2019-02-14 DIAGNOSIS — I83813 Varicose veins of bilateral lower extremities with pain: Secondary | ICD-10-CM

## 2019-02-14 NOTE — Progress Notes (Signed)
SUBJECTIVE:  Patient ID: Diane Williamson, female    DOB: [birth year], 28 y.o.   MRN: [id number] Chief Complaint  Patient presents with  . Follow-up    4 wk laser f/u    HPI  Diane Williamson is a 28 y.o. female The patient returns to the office for followup status post laser ablation of the right great saphenous vein on 01/17/2019 . The patient notes multiple residual varicosities bilaterally which continued to hurt with dependent positions and remained tender to palpation. The patient's swelling is unchanged from preoperative status. The patient continues to wear graduated compression stockings on a daily basis but these are not eliminating the pain and discomfort. The patient continues to use over-the-counter anti-inflammatory medications to treat the pain and related symptoms but this has not given the patient relief. The patient notes the pain in the lower extremities is causing problems with daily exercise, problems at work and even with household activities such as preparing meals and doing dishes.  The patient is otherwise done well and there have been no complications related to the laser procedure or interval changes in the patient's overall   Venous ultrasound post laser shows successful laser ablation of the right GSV, no DVT identified.  Past Medical History:  Diagnosis Date  . ADD (attention deficit disorder)   . Asthma   . Cystitis   . Genital herpes     Past Surgical History:  Procedure Laterality Date  . NO PAST SURGERIES      Social History   Socioeconomic History  . Marital status: Legally Separated    Spouse name: Not on file  . Number of children: Not on file  . Years of education: 23  . Highest education level: Some college, no degree  Occupational History  . Not on file  Social Needs  . Financial resource strain: Not hard at all  . Food insecurity    Worry: Never true    Inability: Never true  . Transportation needs    Medical: No    Non-medical: No   Tobacco Use  . Smoking status: Former Smoker    Packs/day: 0.50    Years: 7.00    Pack years: 3.50    Types: Cigarettes    Quit date: 01/17/2016    Years since quitting: 3.0  . Smokeless tobacco: Never Used  Substance and Sexual Activity  . Alcohol use: Not Currently  . Drug use: No  . Sexual activity: Not Currently  Lifestyle  . Physical activity    Days per week: 3 days    Minutes per session: 60 min  . Stress: Not at all  Relationships  . Social Herbalist on phone: Once a week    Gets together: Once a week    Attends religious service: Never    Active member of club or organization: No    Attends meetings of clubs or organizations: Never    Relationship status: Separated  . Intimate partner violence    Fear of current or ex partner: No    Emotionally abused: No    Physically abused: No    Forced sexual activity: No  Other Topics Concern  . Not on file  Social History Narrative  . Not on file    Family History  Problem Relation Age of Onset  . ADD / ADHD Father   . Cancer Maternal Grandmother   . Heart disease Maternal Grandmother   . Diabetes Maternal Grandfather   .  Diabetes Paternal Grandmother   . Atrial fibrillation Paternal Grandmother   . AAA (abdominal aortic aneurysm) Paternal Grandmother   . Heart attack Paternal Grandfather   . Varicose Veins Paternal Grandfather     Allergies  Allergen Reactions  . Benadryl Allergy [Diphenhydramine Hcl]     Rapid heart rate.     Review of Systems   Review of Systems: Negative Unless Checked Constitutional: '[]' Weight loss  '[]' Fever  '[]' Chills Cardiac: '[]' Chest pain   '[]'  Atrial Fibrillation  '[]' Palpitations   '[]' Shortness of breath when laying flat   '[]' Shortness of breath with exertion. '[]' Shortness of breath at rest Vascular:  '[]' Pain in legs with walking   '[]' Pain in legs with standing '[]' Pain in legs when laying flat   '[]' Claudication    '[]' Pain in feet when laying flat    '[]' History of DVT   '[]' Phlebitis    '[x]' Swelling in legs   '[x]' Varicose veins   '[]' Non-healing ulcers Pulmonary:   '[]' Uses home oxygen   '[]' Productive cough   '[]' Hemoptysis   '[]' Wheeze  '[]' COPD   '[]' Asthma Neurologic:  '[]' Dizziness   '[]' Seizures  '[]' Blackouts '[]' History of stroke   '[]' History of TIA  '[]' Aphasia   '[]' Temporary Blindness   '[]' Weakness or numbness in arm   '[]' Weakness or numbness in leg Musculoskeletal:   '[]' Joint swelling   '[]' Joint pain   '[]' Low back pain  '[]'  History of Knee Replacement '[]' Arthritis '[]' back Surgeries  '[]'  Spinal Stenosis    Hematologic:  '[]' Easy bruising  '[]' Easy bleeding   '[]' Hypercoagulable state   '[x]' Anemic Gastrointestinal:  '[]' Diarrhea   '[]' Vomiting  '[x]' Gastroesophageal reflux/heartburn   '[]' Difficulty swallowing. '[]' Abdominal pain Genitourinary:  '[]' Chronic kidney disease   '[]' Difficult urination  '[]' Anuric   '[]' Blood in urine '[]' Frequent urination  '[]' Burning with urination   '[]' Hematuria Skin:  '[]' Rashes   '[]' Ulcers '[]' Wounds Psychological:  '[x]' History of anxiety   '[]'  History of major depression  '[]'  Memory Difficulties      OBJECTIVE:   Physical Exam  BP 124/78 (BP Location: Right Arm)   Pulse 89   Resp 16   Ht '5\' 10"'  (1.778 m)   Wt 266 lb (120.7 kg)   BMI 38.17 kg/m   Gen: WD/WN, NAD Head: [place]/AT, No temporalis wasting.  Ear/Nose/Throat: Hearing grossly intact, nares w/o erythema or drainage Eyes: PER, EOMI, sclera nonicteric.  Neck: Supple, no masses.  No JVD.  Pulmonary:  Good air movement, no use of accessory muscles.  Cardiac: RRR Vascular: scattered varicosities present bilaterally.  Mild venous stasis changes to the legs bilaterally.  2+ softedema  Vessel Right Left  Radial Palpable Palpable  Dorsalis Pedis Palpable Palpable  Posterior Tibial Palpable Palpable   Gastrointestinal: soft, non-distended. No guarding/no peritoneal signs.  Musculoskeletal: M/S 5/5 throughout.  No deformity or atrophy.  Neurologic: Pain and light touch intact in extremities.  Symmetrical.  Speech is fluent. Motor exam as listed  above. Psychiatric: Judgment intact, Mood & affect appropriate for pt's clinical situation. Dermatologic: No Venous rashes. No Ulcers Noted.  No changes consistent with cellulitis. Lymph : No Cervical lymphadenopathy, no lichenification or skin changes of chronic lymphedema.       ASSESSMENT AND PLAN:  1. Varicose veins of both lower extremities with pain Recommend:  The patient has had successful ablation of the previously incompetent saphenous venous system but still has persistent symptoms of pain and swelling that are having a negative impact on daily life and daily activities.  Patient should undergo injection sclerotherapy to treat the residual varicosities of the right lower extremity.  The risks, benefits and alternative therapies were reviewed in detail with the patient.  All questions were answered.  The patient agrees to proceed with sclerotherapy at their convenience.  The patient will continue wearing the graduated compression stockings and using the over-the-counter pain medications to treat her symptoms.       2. Gastroesophageal reflux disease, unspecified whether esophagitis present Continue PPI as already ordered, this medication has been reviewed and there are no changes at this time.  Avoidence of caffeine and alcohol  Moderate elevation of the head of the bed    Current Outpatient Medications on File Prior to Visit  Medication Sig Dispense Refill  . acyclovir (ZOVIRAX) 800 MG tablet Take 1 tablet (800 mg total) by mouth 3 (three) times daily. For two days with outbreaks; one daily for suppression 38 tablet 5  . cetirizine (ZYRTEC ALLERGY) 10 MG tablet Take 1 tablet (10 mg total) by mouth daily. 30 tablet 6  . EPINEPHrine (EPIPEN 2-PAK) 0.3 mg/0.3 mL IJ SOAJ injection Inject 0.3 mLs (0.3 mg total) into the muscle as needed for anaphylaxis. For life-threatening allergic or anaphylactic reaction 1 Device 1  . ipratropium-albuterol (DUONEB) 0.5-2.5 (3) MG/3ML SOLN  Take 3 mLs by nebulization every 4 (four) hours as needed. 360 mL 0  . montelukast (SINGULAIR) 10 MG tablet Take 1 tablet (10 mg total) by mouth at bedtime. 90 tablet 0  . NUVARING 0.12-0.015 MG/24HR vaginal ring INSERT ONE RING INTO THE VAGINA FOR THREE WEEKS. REMOVE FOR SEVEN DAYS FOR MENSES 1 each 3  . pantoprazole (PROTONIX) 40 MG tablet Take 1 tablet (40 mg total) by mouth daily. 30 tablet 1  . PROAIR HFA 108 (90 Base) MCG/ACT inhaler INHALE 2 PUFFS BY MOUTH EVERY 4 HOURS AS NEEDED FOR WHEEZING FOR SHORTNESS OF BREATH 9 g 0  . Respiratory Therapy Supplies (NEBULIZER/TUBING/MOUTHPIECE) KIT Disp one nebulizer machine, tubing set and mouthpiece kit 1 kit 0  . Tiotropium Bromide Monohydrate (SPIRIVA RESPIMAT) 1.25 MCG/ACT AERS Inhale 2 puffs into the lungs 2 (two) times daily. (Patient not taking: Reported on 02/14/2019) 1 Inhaler 5  . triamcinolone (NASACORT) 55 MCG/ACT AERO nasal inhaler Place 1 spray into the nose 2 (two) times daily. (Patient not taking: Reported on 02/14/2019) 1 Inhaler 2   No current facility-administered medications on file prior to visit.     There are no Patient Instructions on file for this visit. No follow-ups on file.   Kris Hartmann, NP  This note was completed with Sales executive.  Any errors are purely unintentional.

## 2019-03-21 ENCOUNTER — Other Ambulatory Visit: Payer: Self-pay | Admitting: Family Medicine

## 2019-03-21 ENCOUNTER — Other Ambulatory Visit: Payer: Self-pay | Admitting: Internal Medicine

## 2019-03-21 DIAGNOSIS — J45909 Unspecified asthma, uncomplicated: Secondary | ICD-10-CM

## 2019-03-21 DIAGNOSIS — J45998 Other asthma: Secondary | ICD-10-CM

## 2019-03-21 NOTE — Telephone Encounter (Signed)
Requested medication (s) are due for refill today: yes  Requested medication (s) are on the active medication list: yes  Last refill:  02/06/2019 Future visit scheduled: no  Notes to clinic:  Beta agonists failed  Requested Prescriptions  Pending Prescriptions Disp Refills   PROAIR HFA 108 (90 Base) MCG/ACT inhaler [Pharmacy Med Name: ProAir HFA 108 (90 Base) MCG/ACT Inhalation Aerosol Solution] 9 g 0    Sig: INHALE 2 PUFFS BY MOUTH EVERY 4 HOURS AS NEEDED FOR WHEEZING FOR SHORTNESS OF BREATH     Pulmonology:  Beta Agonists Failed - 03/21/2019  3:46 PM      Failed - One inhaler should last at least one month. If the patient is requesting refills earlier, contact the patient to check for uncontrolled symptoms.      Passed - Valid encounter within last 12 months    Recent Outpatient Visits          2 months ago Gastroesophageal reflux disease without esophagitis   [facility] Delsa Grana, PA-C   8 months ago Suprapubic pressure   [place], NP   9 months ago Uncontrolled persistent asthma   [facility], Satira Anis, MD

## 2019-04-24 ENCOUNTER — Ambulatory Visit (INDEPENDENT_AMBULATORY_CARE_PROVIDER_SITE_OTHER): Payer: BC Managed Care – PPO | Admitting: Vascular Surgery

## 2019-05-01 ENCOUNTER — Ambulatory Visit (INDEPENDENT_AMBULATORY_CARE_PROVIDER_SITE_OTHER): Payer: BC Managed Care – PPO | Admitting: Vascular Surgery

## 2019-05-01 ENCOUNTER — Other Ambulatory Visit: Payer: Self-pay

## 2019-05-01 ENCOUNTER — Encounter (INDEPENDENT_AMBULATORY_CARE_PROVIDER_SITE_OTHER): Payer: Self-pay | Admitting: Vascular Surgery

## 2019-05-01 VITALS — BP 132/82 | HR 71 | Resp 16 | Wt 267.0 lb

## 2019-05-01 DIAGNOSIS — I83813 Varicose veins of bilateral lower extremities with pain: Secondary | ICD-10-CM

## 2019-05-01 DIAGNOSIS — I872 Venous insufficiency (chronic) (peripheral): Secondary | ICD-10-CM

## 2019-05-01 NOTE — Progress Notes (Signed)
Varicose veins of right  lower extremity with inflammation (454.1  I83.10) Current Plans   Indication: Patient presents with symptomatic varicose veins of the right  lower extremity.   Procedure: Sclerotherapy using hypertonic saline mixed with 1% Lidocaine was performed on the right lower extremity. Compression wraps were placed. The patient tolerated the procedure well. 

## 2019-05-04 ENCOUNTER — Other Ambulatory Visit: Payer: Self-pay | Admitting: Family Medicine

## 2019-05-07 ENCOUNTER — Telehealth: Payer: Self-pay

## 2019-05-07 MED ORDER — ALBUTEROL SULFATE 108 (90 BASE) MCG/ACT IN AEPB: 2.0000 | INHALATION_SPRAY | Freq: Four times a day (QID) | RESPIRATORY_TRACT | 2 refills | Status: DC | PRN

## 2019-05-07 NOTE — Telephone Encounter (Signed)
Copied from CRM [id number]. Topic: General - Other >> May 07, 2019  3:30 PM Herby Abraham C wrote: Reason for CRM:pt says that she need to change Rx to generic PROAIR HFA 108 (90 Base) MCG/ACT inhaler, her insurance will only cover generic

## 2019-05-14 DIAGNOSIS — Z915 Personal history of self-harm: Secondary | ICD-10-CM

## 2019-05-14 DIAGNOSIS — F4323 Adjustment disorder with mixed anxiety and depressed mood: Secondary | ICD-10-CM

## 2019-05-14 DIAGNOSIS — IMO0002 Reserved for concepts with insufficient information to code with codable children: Secondary | ICD-10-CM

## 2019-05-14 DIAGNOSIS — Z113 Encounter for screening for infections with a predominantly sexual mode of transmission: Secondary | ICD-10-CM | POA: Diagnosis not present

## 2019-05-14 DIAGNOSIS — R3915 Urgency of urination: Secondary | ICD-10-CM | POA: Diagnosis not present

## 2019-05-14 DIAGNOSIS — L80 Vitiligo: Secondary | ICD-10-CM

## 2019-05-29 ENCOUNTER — Encounter (INDEPENDENT_AMBULATORY_CARE_PROVIDER_SITE_OTHER): Payer: Self-pay

## 2019-05-29 ENCOUNTER — Other Ambulatory Visit: Payer: Self-pay

## 2019-05-29 ENCOUNTER — Ambulatory Visit (INDEPENDENT_AMBULATORY_CARE_PROVIDER_SITE_OTHER): Payer: BC Managed Care – PPO | Admitting: Vascular Surgery

## 2019-05-29 ENCOUNTER — Encounter (INDEPENDENT_AMBULATORY_CARE_PROVIDER_SITE_OTHER): Payer: Self-pay | Admitting: Vascular Surgery

## 2019-05-29 VITALS — BP 134/93 | HR 94 | Resp 16 | Wt 265.0 lb

## 2019-05-29 DIAGNOSIS — I83813 Varicose veins of bilateral lower extremities with pain: Secondary | ICD-10-CM

## 2019-05-29 DIAGNOSIS — I83811 Varicose veins of right lower extremities with pain: Secondary | ICD-10-CM | POA: Diagnosis not present

## 2019-05-29 DIAGNOSIS — I872 Venous insufficiency (chronic) (peripheral): Secondary | ICD-10-CM

## 2019-05-29 NOTE — Progress Notes (Signed)
Varicose veins of right  lower extremity with inflammation (454.1  I83.10) Current Plans   Indication: Patient presents with symptomatic varicose veins of the right  lower extremity.   Procedure: Sclerotherapy using hypertonic saline mixed with 1% Lidocaine was performed on the right lower extremity. Compression wraps were placed. The patient tolerated the procedure well. 

## 2019-06-26 ENCOUNTER — Other Ambulatory Visit: Payer: Self-pay

## 2019-06-26 ENCOUNTER — Ambulatory Visit (INDEPENDENT_AMBULATORY_CARE_PROVIDER_SITE_OTHER): Payer: BC Managed Care – PPO | Admitting: Vascular Surgery

## 2019-06-26 ENCOUNTER — Encounter: Payer: Self-pay | Admitting: Family Medicine

## 2019-06-26 ENCOUNTER — Ambulatory Visit (LOCAL_COMMUNITY_HEALTH_CENTER): Payer: BC Managed Care – PPO | Admitting: Family Medicine

## 2019-06-26 VITALS — BP 123/75 | Ht 70.5 in | Wt 266.6 lb

## 2019-06-26 DIAGNOSIS — Z3009 Encounter for other general counseling and advice on contraception: Secondary | ICD-10-CM | POA: Diagnosis not present

## 2019-06-26 DIAGNOSIS — R87612 Low grade squamous intraepithelial lesion on cytologic smear of cervix (LGSIL): Secondary | ICD-10-CM | POA: Diagnosis not present

## 2019-06-26 LAB — WET PREP FOR TRICH, YEAST, CLUE
Trichomonas Exam: NEGATIVE
Yeast Exam: NEGATIVE

## 2019-06-26 MED ORDER — ETONOGESTREL-ETHINYL ESTRADIOL 0.12-0.015 MG/24HR VA RING: VAGINAL_RING | VAGINAL | 3 refills | Status: DC

## 2019-06-26 NOTE — Progress Notes (Signed)
Here today for Pap Smear and Nuva Ring birth control refill. Last PE here was 08/29/2017, last Pap Smear was 12/13/2011. Declines STD screening. Tawny Hopping, RN

## 2019-06-26 NOTE — Progress Notes (Addendum)
Family Planning Visit  Subjective:  Diane Williamson is a 29 y.o. being seen today for  Chief Complaint  Patient presents with  . Contraception    Pt has Obesity (BMI 35.0-39.9 without comorbidity); Uncontrolled persistent asthma; Anemia; Hematuria, microscopic; ADHD (attention deficit hyperactivity disorder), inattentive type; Varicose veins of both lower extremities with pain; Eczema; Allergic rhinitis; GERD (gastroesophageal reflux disease); Vitamin D deficiency; Chronic venous insufficiency; Adjustment disorder with mixed anxiety and depressed mood; Vitiligo; and History of self-harm on their problem list.  HPI   Patient reports she is here for pap and Nuva Ring refill. Has been using Nuva Ring x2 yrs. She ran out 1 mo ago.   Pt does not meet any of the following contraindications to estrogen use: -Age ?35 years and smoking ?15 cigarettes per day -Migraine with aura -Two or more RF for arterial CVD (such as older age, smoking, diabetes, and hypertension) -HTN -Breast cancer -VTE hx or acute event -Known thrombogenic mutations -Known ischemic heart disease -History of stroke -Complicated valvular heart disease (pulmonary HTN, risk for afib, hx subacute bacterial endocarditis) -Cirrhosis, Hepatocellular adenoma or malignant hepatoma  Also endorses vaginal itching, would like tested for yeast.    Patient's last menstrual period was 06/16/2019 (exact date). Last sex: >1 mo ago BCM: abstinence since ran out of Nuva Ring Pt desires EC? n/a  Last pap: 12/13/2011, results nml per pt.  Patient reports 1 partner(s) in last year. Do they desire STI screening (if no, why not)? no  Does the patient desire a pregnancy in the next year? no   29 y.o., Body mass index is 37.71 kg/m. - Is patient eligible for HA1C diabetes screening based on BMI and age >22?  no  Does the patient have a current or past history of drug use? no No components found for: HCV  See flowsheet for other  program required questions.   Health Maintenance Due  Topic Date Due  . TETANUS/TDAP  11/02/2009  . INFLUENZA VACCINE  11/17/2018  . PAP-Cervical Cytology Screening  04/19/2019  . PAP SMEAR-Modifier  04/19/2019    ROS  The following portions of the patient's history were reviewed and updated as appropriate: allergies, current medications, past family history, past medical history, past social history, past surgical history and problem list. Problem list updated.  Objective:  BP 123/75   Ht 5' 10.5" (1.791 m)   Wt 266 lb 9.6 oz (120.9 kg)   LMP 06/16/2019 (Exact Date)   BMI 37.71 kg/m    Physical Exam  Vitals and nursing note reviewed.  Constitutional:      Appearance: Normal appearance.  HENT:     Head: Normocephalic and atraumatic.     Mouth/Throat:     Mouth: Mucous membranes are moist.     Pharynx: Oropharynx is clear. No oropharyngeal exudate or posterior oropharyngeal erythema.  Breasts:        Right: Normal. No swelling, mass, nipple discharge, skin change or tenderness.        Left: Normal. No swelling, mass, nipple discharge, skin change or tenderness.  Pulmonary:     Effort: Pulmonary effort is normal.  Abdominal:     General: Abdomen is flat.     Palpations: There is no mass.     Tenderness: There is no abdominal tenderness. There is no rebound.  Genitourinary:    Comments: Normal appearing external genitalia; normal appearing vaginal mucosa and cervix. +white, curdlike discharge, ph<4.5.  Pap smear obtained.  Uterine size undeterminable d/t body  habitus, no other palpable masses, no uterine or adnexal tenderness. Lymphadenopathy:     Head:     Right side of head: No preauricular or posterior auricular adenopathy.     Left side of head: No preauricular or posterior auricular adenopathy.     Cervical: No cervical adenopathy.     Upper Body:     Right upper body: No supraclavicular or axillary adenopathy.     Left upper body: No supraclavicular or axillary  adenopathy.  Skin:    General: Skin is warm and dry.     Findings: No rash.  Neurological:     Mental Status: She is alert and oriented to person, place, and time.     Assessment and Plan:  Diane Williamson is a 29 y.o. female presenting to the [facility] Department for a well woman exam/family planning visit  Contraception counseling: Reviewed all forms of birth control options in the tiered based approach including abstinence; over the counter/barrier methods; hormonal contraceptive medication including pill, patch, ring, injection, contraceptive implant; hormonal and nonhormonal IUDs; permanent sterilization options including vasectomy and the various tubal sterilization modalities. Risks, benefits, how to discontinue and typical effectiveness rates were reviewed.  Questions were answered.  Written information was also given to the patient to review.  Patient desires NuvaRing, this was prescribed for patient. She will follow up in  1 year for surveillance.  She was told to call with any further questions, or with any concerns about this method of contraception.  Emphasized use of condoms 100% of the time for STI prevention.  Emergency Contraception: n/a - abstinence   1. Family planning services -Rx NuvaRing x1 yr. Counseling as above, including possible incr VTE risk w/estrogen and BMI >30, varicose veins. Pt would like to continue.  -Pap today. -Wet prep today d/t complaint of itching. Pt declines GC/chlamydia or other STI screenings. RN to tx per standing order.  - etonogestrel-ethinyl estradiol (NUVARING) 0.12-0.015 MG/24HR vaginal ring; Insert vaginally and leave in place for 3 consecutive weeks, then remove for 1 week.  Dispense: 3 each; Refill: 3 - IGP, rfx Aptima HPV ASCU - WET PREP FOR TRICH, YEAST, CLUE     Return in about 1 year (around 06/25/2020) for yearly wellness exam.  No future appointments.  Ann Held, PA-C

## 2019-06-26 NOTE — Progress Notes (Signed)
Wet [place] results reviewed. Per standing orders no treatment indicated. Savana Spina, RN  

## 2019-06-28 ENCOUNTER — Encounter: Payer: Self-pay | Admitting: Family Medicine

## 2019-07-02 LAB — IGP, RFX APTIMA HPV ASCU: PAP Smear Comment: 0

## 2019-07-04 ENCOUNTER — Encounter: Payer: Self-pay | Admitting: Family Medicine

## 2019-07-04 DIAGNOSIS — N870 Mild cervical dysplasia: Secondary | ICD-10-CM | POA: Insufficient documentation

## 2019-07-04 DIAGNOSIS — R87612 Low grade squamous intraepithelial lesion on cytologic smear of cervix (LGSIL): Secondary | ICD-10-CM | POA: Insufficient documentation

## 2019-07-05 ENCOUNTER — Other Ambulatory Visit: Payer: Self-pay | Admitting: Family Medicine

## 2019-07-08 ENCOUNTER — Telehealth: Payer: Self-pay

## 2019-07-08 NOTE — Telephone Encounter (Signed)
Attempted TC to patient. No answer.  RN will send Mychart message Richmond Campbell, RN   Needs colpo.

## 2019-07-12 ENCOUNTER — Telehealth: Payer: Self-pay | Admitting: Obstetrics & Gynecology

## 2019-07-12 NOTE — Telephone Encounter (Signed)
[facility] referring for EPITHELIAL CELL ABNORMALITY. LOW-GRADE SQUAMOUS INTRAEPITHELIAL LESION (LSIL); (ENCOMPASSING HUMAN  PAPILLOMAVIRUS /MILD DYSPLASIA/CIN1). Called and left voicemail for patient to call back to be schedule

## 2019-07-12 NOTE — Telephone Encounter (Signed)
TC from patient.  Informed of abn pap results and need for colpo. Patient states has been seen at [facility] in the past and would like to return there.  Referral will be sent via Epic to [facility] for colpo. Richmond Campbell, RN

## 2019-07-15 NOTE — Telephone Encounter (Signed)
Called and left voice mail for patient to call back to be schedule °

## 2019-07-18 IMAGING — DX DG HAND COMPLETE 3+V*L*
3 series · 3 of 3 positions shown · non-contrast
Comparison: None.

CLINICAL DATA: Chronic BILATERAL hand pain which the patient
relates to the lifting that she does at work.

EXAM:
LEFT HAND - COMPLETE 3+ VIEW

[hand pa]
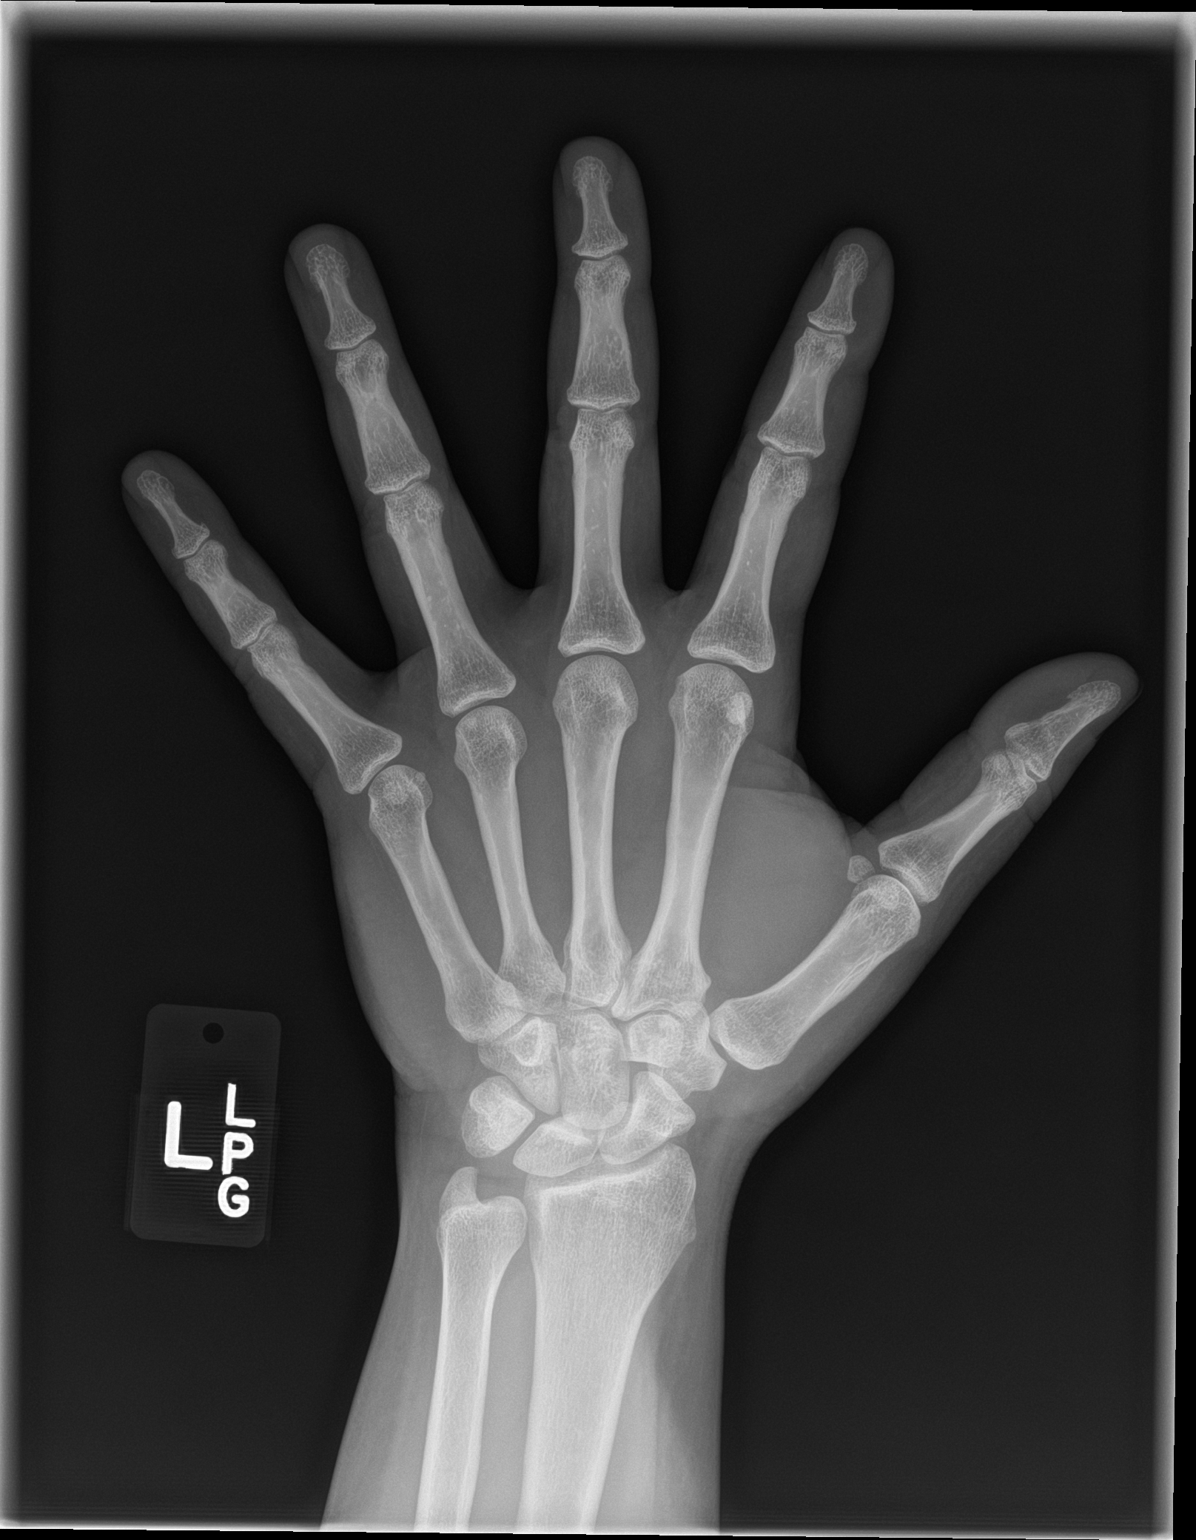

[hand obl]
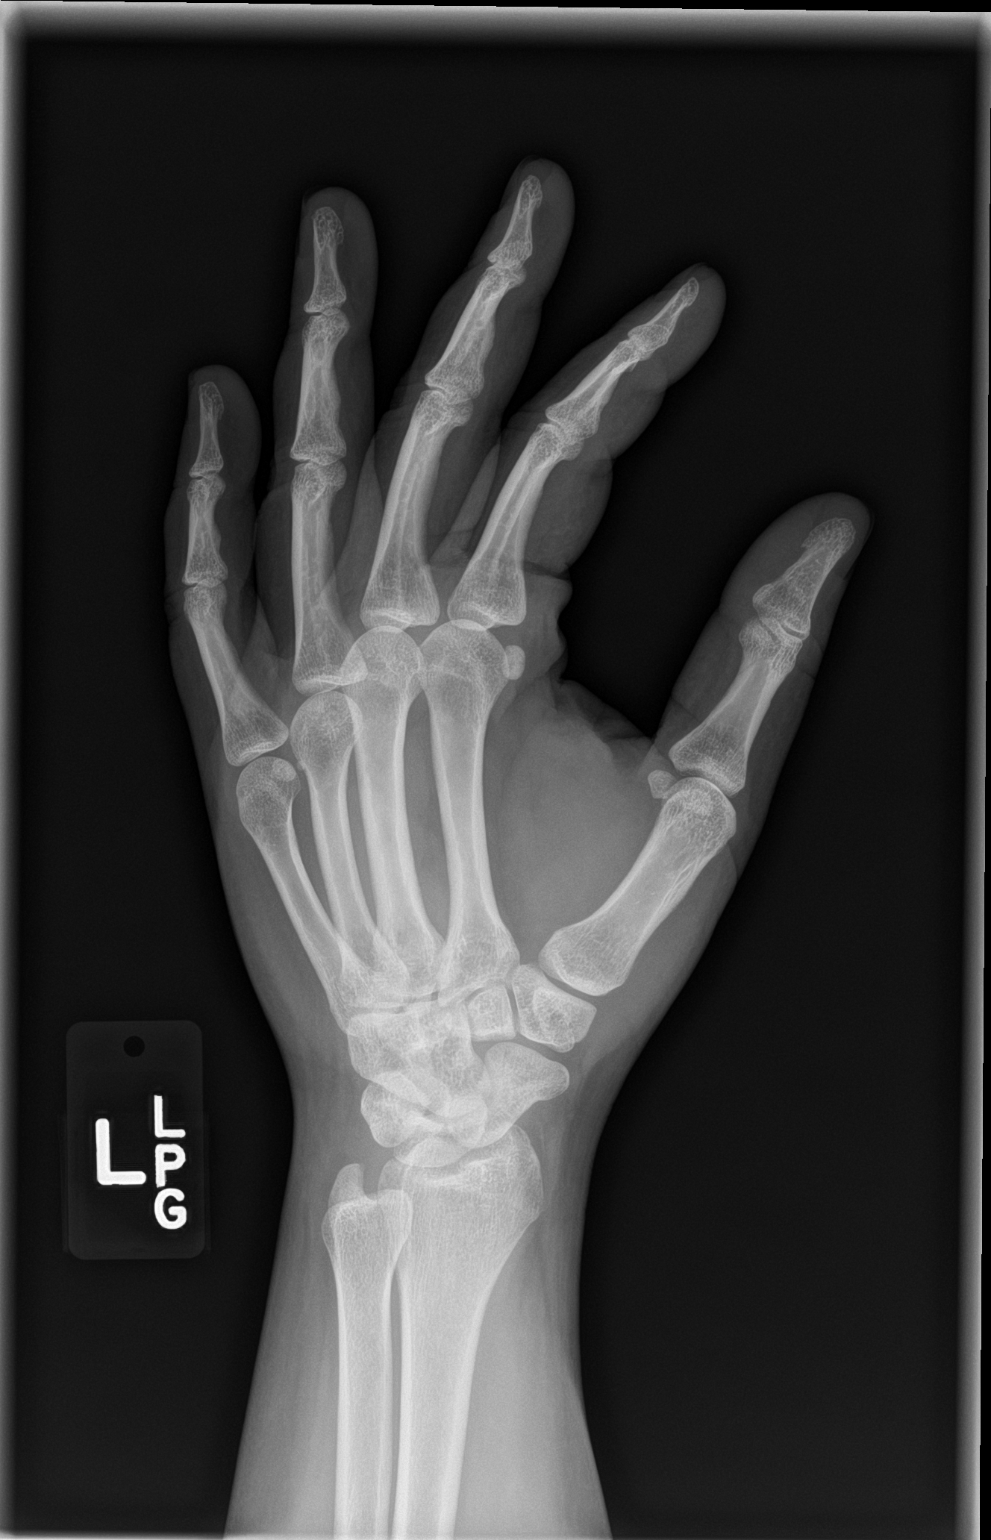

[hand lat]
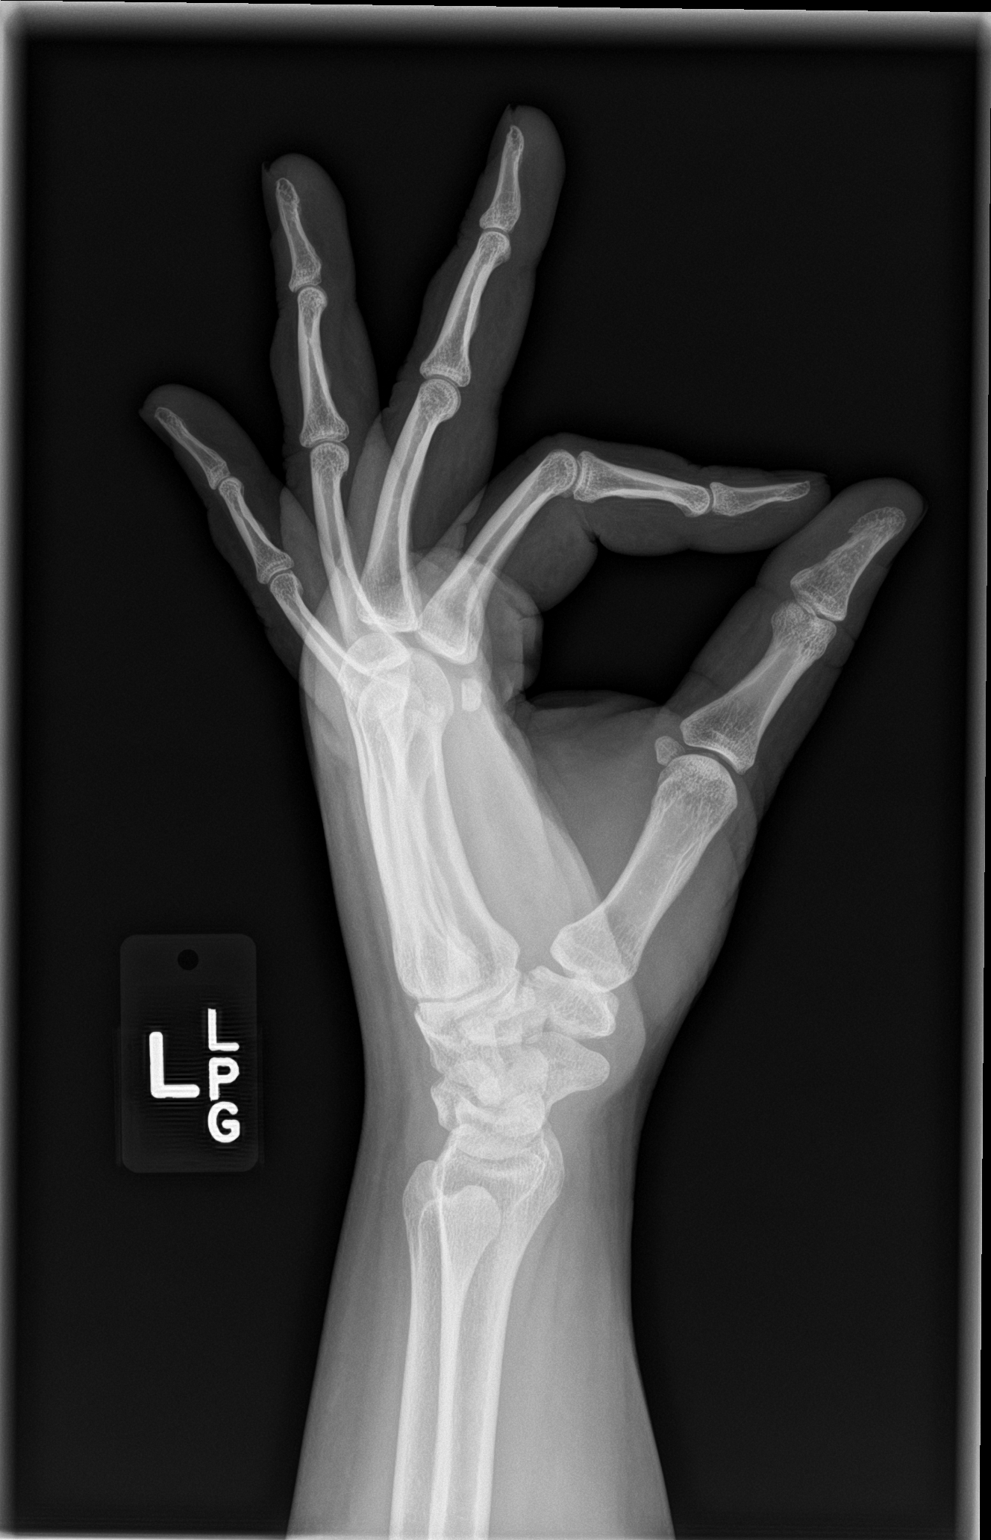

[3 of 3 positions shown; findings below may reference images not displayed]

FINDINGS: No evidence of acute fracture or dislocation. Joint spaces well
preserved. Well-preserved bone mineral density. No intrinsic osseous
abnormalities.
IMPRESSION: Normal examination.

## 2019-07-22 ENCOUNTER — Other Ambulatory Visit: Payer: Self-pay | Admitting: Family Medicine

## 2019-08-02 ENCOUNTER — Ambulatory Visit (INDEPENDENT_AMBULATORY_CARE_PROVIDER_SITE_OTHER): Payer: BC Managed Care – PPO | Admitting: Obstetrics and Gynecology

## 2019-08-02 ENCOUNTER — Encounter: Payer: Self-pay | Admitting: Obstetrics and Gynecology

## 2019-08-02 ENCOUNTER — Other Ambulatory Visit: Payer: Self-pay

## 2019-08-02 ENCOUNTER — Other Ambulatory Visit (HOSPITAL_COMMUNITY)
Admission: RE | Admit: 2019-08-02 | Discharge: 2019-08-02 | Disposition: A | Discharge status: Home / Self Care | Payer: BC Managed Care – PPO | Source: Ambulatory Visit | Attending: Obstetrics and Gynecology | Admitting: Obstetrics and Gynecology

## 2019-08-02 VITALS — BP 120/78 | Ht 70.5 in | Wt 266.0 lb

## 2019-08-02 DIAGNOSIS — Z113 Encounter for screening for infections with a predominantly sexual mode of transmission: Secondary | ICD-10-CM | POA: Diagnosis not present

## 2019-08-02 DIAGNOSIS — N898 Other specified noninflammatory disorders of vagina: Secondary | ICD-10-CM

## 2019-08-02 DIAGNOSIS — R87612 Low grade squamous intraepithelial lesion on cytologic smear of cervix (LGSIL): Secondary | ICD-10-CM

## 2019-08-02 DIAGNOSIS — N870 Mild cervical dysplasia: Secondary | ICD-10-CM | POA: Diagnosis not present

## 2019-08-02 NOTE — Patient Instructions (Signed)
Colposcopy, Care After This sheet gives you information about how to care for yourself after your procedure. Your doctor may also give you more specific instructions. If you have problems or questions, contact your doctor. What can I expect after the procedure? If you did not have a tissue sample removed (did not have a biopsy), you may only have some spotting for a few days. You can go back to your normal activities. If you had a tissue sample removed, it is common to have:  Soreness and pain. This may last for a few days.  Light-headedness.  Mild bleeding from your vagina or dark-colored, grainy discharge from your vagina. This may last for a few days. You may need to wear a sanitary pad.  Spotting for at least 48 hours after the procedure. Follow these instructions at home:   Take over-the-counter and prescription medicines only as told by your doctor. Ask your doctor what medicines you can start taking again. This is very important if you take blood-thinning medicine.  Do not drive or use heavy machinery while taking prescription pain medicine.  For 3 days, or as long as your doctor tells you, avoid: ? Douching. ? Using tampons. ? Having sex.  If you use birth control (contraception), keep using it.  Limit activity for the first day after the procedure. Ask your doctor what activities are safe for you.  It is up to you to get the results of your procedure. Ask your doctor when your results will be ready.  Keep all follow-up visits as told by your doctor. This is important. Contact a doctor if:  You get a skin rash. Get help right away if:  You are bleeding a lot from your vagina. It is a lot of bleeding if you are using more than one pad an hour for 2 hours in a row.  You have clumps of blood (blood clots) coming from your vagina.  You have a fever.  You have chills  You have pain in your lower belly (pelvic area).  You have signs of infection, such as vaginal  discharge that is: ? Different than usual. ? Yellow. ? Bad-smelling.  You have very pain or cramps in your lower belly that do not get better with medicine.  You feel light-headed.  You feel dizzy.  You pass out (faint). Summary  If you did not have a tissue sample removed (did not have a biopsy), you may only have some spotting for a few days. You can go back to your normal activities.  If you had a tissue sample removed, it is common to have mild pain and spotting for 48 hours.  For 3 days, or as long as your doctor tells you, avoid douching, using tampons and having sex.  Get help right away if you have bleeding, very bad pain, or signs of infection. This information is not intended to replace advice given to you by your health care provider. Make sure you discuss any questions you have with your health care provider. Document Revised: 03/17/2017 Document Reviewed: 12/23/2015 Elsevier Patient Education  2020 Elsevier Inc.  

## 2019-08-02 NOTE — Progress Notes (Signed)
   GYNECOLOGY CLINIC COLPOSCOPY PROCEDURE NOTE  29 y.o. No obstetric history on file. here for colposcopy for low-grade squamous intraepithelial neoplasia (LGSIL - encompassing HPV,mild dysplasia,CIN I)  pap smear on 06/26/2019. Discussed underlying role for HPV infection in the development of cervical dysplasia, its natural history and progression/regression, need for surveillance.  Is the patient  pregnant: No LMP: Patient's last menstrual period was 07/27/2019. Smoking status:  reports that she quit smoking about 3 years ago. Her smoking use included cigarettes. She has a 3.50 pack-year smoking history. She has never used smokeless tobacco. Contraception: NuvaRing vaginal inserts Future fertility desired:  Yes  Patient given informed consent, signed copy in the chart, time out was performed.  The patient was position in dorsal lithotomy position. Speculum was placed the cervix was visualized.   After application of acetic acid colposcopic inspection of the cervix was undertaken.   Colposcopy adequate, full visualization of transformation zone: No no visible lesions;  12 o'clock biopsy and ECC performed.   ECC specimen obtained:  Yes  All specimens were labeled and sent to pathology.   Patient was given post procedure instructions.  Will follow up pathology and manage accordingly.  Routine preventative health maintenance measures emphasized.  Physical Exam Genitourinary:       Adelene Idler MD [facility], [facility] 08/02/2019 3:09 PM

## 2019-08-05 LAB — NUSWAB VAGINITIS PLUS (VG+)
Atopobium vaginae: HIGH Score — AB
BVAB 2: HIGH Score — AB
Candida albicans, NAA: NEGATIVE
Candida glabrata, NAA: NEGATIVE
Chlamydia trachomatis, NAA: NEGATIVE
Megasphaera 1: HIGH Score — AB
Neisseria gonorrhoeae, NAA: NEGATIVE
Trich vag by NAA: NEGATIVE

## 2019-08-06 ENCOUNTER — Other Ambulatory Visit: Payer: Self-pay | Admitting: Obstetrics and Gynecology

## 2019-08-06 DIAGNOSIS — B9689 Other specified bacterial agents as the cause of diseases classified elsewhere: Secondary | ICD-10-CM

## 2019-08-06 LAB — SURGICAL PATHOLOGY

## 2019-08-06 MED ORDER — METRONIDAZOLE 500 MG PO TABS: 500.0000 mg | ORAL_TABLET | Freq: Two times a day (BID) | ORAL | 0 refills | Status: AC

## 2019-08-06 NOTE — Progress Notes (Signed)
Called patient. Left message to check mychart for note and she can call the office with questions.

## 2019-09-04 ENCOUNTER — Ambulatory Visit (INDEPENDENT_AMBULATORY_CARE_PROVIDER_SITE_OTHER): Payer: BC Managed Care – PPO | Admitting: Vascular Surgery

## 2019-09-09 ENCOUNTER — Encounter: Payer: Self-pay | Admitting: Family Medicine

## 2019-09-15 ENCOUNTER — Other Ambulatory Visit: Payer: Self-pay | Admitting: Family Medicine

## 2019-09-18 ENCOUNTER — Ambulatory Visit (INDEPENDENT_AMBULATORY_CARE_PROVIDER_SITE_OTHER): Payer: BC Managed Care – PPO | Admitting: Vascular Surgery

## 2019-10-03 DIAGNOSIS — G5602 Carpal tunnel syndrome, left upper limb: Secondary | ICD-10-CM | POA: Diagnosis not present

## 2019-10-03 DIAGNOSIS — Z20822 Contact with and (suspected) exposure to covid-19: Secondary | ICD-10-CM | POA: Diagnosis not present

## 2019-10-03 DIAGNOSIS — Z01812 Encounter for preprocedural laboratory examination: Secondary | ICD-10-CM | POA: Diagnosis not present

## 2019-10-04 DIAGNOSIS — W540XXA Bitten by dog, initial encounter: Secondary | ICD-10-CM | POA: Diagnosis not present

## 2019-10-04 DIAGNOSIS — Z23 Encounter for immunization: Secondary | ICD-10-CM | POA: Diagnosis not present

## 2019-10-04 DIAGNOSIS — Z6837 Body mass index (BMI) 37.0-37.9, adult: Secondary | ICD-10-CM | POA: Diagnosis not present

## 2019-10-04 DIAGNOSIS — S41151A Open bite of right upper arm, initial encounter: Secondary | ICD-10-CM | POA: Diagnosis not present

## 2019-10-07 DIAGNOSIS — Z87891 Personal history of nicotine dependence: Secondary | ICD-10-CM | POA: Diagnosis not present

## 2019-10-07 DIAGNOSIS — G5602 Carpal tunnel syndrome, left upper limb: Secondary | ICD-10-CM | POA: Diagnosis not present

## 2019-10-14 DIAGNOSIS — M79642 Pain in left hand: Secondary | ICD-10-CM | POA: Diagnosis not present

## 2019-10-14 DIAGNOSIS — M79641 Pain in right hand: Secondary | ICD-10-CM | POA: Diagnosis not present

## 2019-10-22 DIAGNOSIS — M79641 Pain in right hand: Secondary | ICD-10-CM | POA: Diagnosis not present

## 2019-10-22 DIAGNOSIS — M79642 Pain in left hand: Secondary | ICD-10-CM | POA: Diagnosis not present

## 2019-10-31 DIAGNOSIS — M79642 Pain in left hand: Secondary | ICD-10-CM | POA: Diagnosis not present

## 2019-10-31 DIAGNOSIS — M79641 Pain in right hand: Secondary | ICD-10-CM | POA: Diagnosis not present

## 2019-11-05 DIAGNOSIS — M79641 Pain in right hand: Secondary | ICD-10-CM | POA: Diagnosis not present

## 2019-11-05 DIAGNOSIS — M79642 Pain in left hand: Secondary | ICD-10-CM | POA: Diagnosis not present

## 2019-11-08 DIAGNOSIS — G5601 Carpal tunnel syndrome, right upper limb: Secondary | ICD-10-CM | POA: Diagnosis not present

## 2019-11-11 DIAGNOSIS — G5601 Carpal tunnel syndrome, right upper limb: Secondary | ICD-10-CM | POA: Diagnosis not present

## 2019-11-16 DIAGNOSIS — G5601 Carpal tunnel syndrome, right upper limb: Secondary | ICD-10-CM | POA: Insufficient documentation

## 2019-11-19 DIAGNOSIS — M79641 Pain in right hand: Secondary | ICD-10-CM | POA: Diagnosis not present

## 2019-12-05 DIAGNOSIS — G5601 Carpal tunnel syndrome, right upper limb: Secondary | ICD-10-CM | POA: Diagnosis not present

## 2019-12-05 DIAGNOSIS — M79641 Pain in right hand: Secondary | ICD-10-CM | POA: Diagnosis not present

## 2019-12-10 ENCOUNTER — Encounter (INDEPENDENT_AMBULATORY_CARE_PROVIDER_SITE_OTHER): Payer: Self-pay | Admitting: Vascular Surgery

## 2019-12-12 DIAGNOSIS — M79641 Pain in right hand: Secondary | ICD-10-CM | POA: Diagnosis not present

## 2019-12-30 ENCOUNTER — Encounter: Payer: Self-pay | Admitting: Internal Medicine

## 2019-12-30 ENCOUNTER — Ambulatory Visit (INDEPENDENT_AMBULATORY_CARE_PROVIDER_SITE_OTHER): Payer: BC Managed Care – PPO | Admitting: Internal Medicine

## 2019-12-30 ENCOUNTER — Other Ambulatory Visit: Payer: Self-pay

## 2019-12-30 VITALS — BP 108/70 | HR 95 | Temp 98.1°F | Resp 16 | Ht 70.5 in | Wt 269.9 lb

## 2019-12-30 DIAGNOSIS — M545 Low back pain, unspecified: Secondary | ICD-10-CM | POA: Insufficient documentation

## 2019-12-30 DIAGNOSIS — F418 Other specified anxiety disorders: Secondary | ICD-10-CM | POA: Insufficient documentation

## 2019-12-30 DIAGNOSIS — G4700 Insomnia, unspecified: Secondary | ICD-10-CM | POA: Insufficient documentation

## 2019-12-30 DIAGNOSIS — F5104 Psychophysiologic insomnia: Secondary | ICD-10-CM | POA: Diagnosis not present

## 2019-12-30 DIAGNOSIS — G8929 Other chronic pain: Secondary | ICD-10-CM

## 2019-12-30 DIAGNOSIS — F411 Generalized anxiety disorder: Secondary | ICD-10-CM | POA: Diagnosis not present

## 2019-12-30 MED ORDER — SERTRALINE HCL 50 MG PO TABS: 50.0000 mg | ORAL_TABLET | Freq: Every day | ORAL | 1 refills | Status: DC

## 2019-12-30 MED ORDER — CYCLOBENZAPRINE HCL 5 MG PO TABS: 5.0000 mg | ORAL_TABLET | Freq: Every evening | ORAL | 1 refills | Status: DC | PRN

## 2019-12-30 NOTE — Progress Notes (Signed)
Patient ID: Diane Williamson, female    DOB: September 27, 1990, 29 y.o.   MRN: [id number]  PCP: Arnetha Courser, MD  Chief Complaint  Patient presents with  . Anxiety    Says she has always dealth with anxiety but lately it has been out of control    Subjective:   Diane Williamson is a 29 y.o. female, presents to clinic with CC of the following:  Chief Complaint  Patient presents with  . Anxiety    Says she has always dealth with anxiety but lately it has been out of control    HPI:  Patient is a 29 year old female Last visit at [facility] was with Delsa Grana on 12/31/2018 She follows up today with anxiety concerns  She noted she has battled anxiety for years, although has really been much more problematic in the past month plus.  Not currently seeing counseling or psychiatry, [facility], was seeing weekly for a few months, and then stopped due to conflicts with work.  She stopped seeing them last year. She notes her anxiety sources include when she has to be on time for something, stresses at work. Sleeping is difficult, often up with concerns whether her alarm will go off. She currently lives with her parents, and notes she does not talk much about her anxiety with them, and dad has had anger issues of his own when she was growing up and has been an issue in the past.  She does have a boyfriend who lives in dorm, who she can talk to. Denies major depression concerns, no marked feelings of sadness No thoughts of hurting self or others She has tried valerian root daily without much benefit.  Denies trying other supplements for this.  Has never been on medications for management of her anxiety or depression concerns. PHQ and GAD-7 reviewed No heart racing or palpitations  Has panic attacks, usually about one per week , when occur she is in tears, hyperventilate, notes has to be by self. Missed work today to come here and be assessed. + clenches teeth a lot, +  HA's - every other day for past 2-3 weeks, has tried Tylenol products for these. + nausea, no vomiting  She also has battled some back pain, she describes it as tightness, and feels it across her lower back, and at times up her muscles of her back through her shoulders and neck region.  She did see the chiropractor today with an adjustment done.  She notes her back has been tense.  Denies any supplement/other drug use Alcohol use -  Not using presently Tob - former cig smoker, vape now Prior TSH was normal, Dad and his mom have thyroid disease Lab Results  Component Value Date   TSH 1.02 06/07/2018      Patient Active Problem List   Diagnosis Date Noted  . Dysplasia of cervix, low grade (CIN 1) 07/04/2019  . Chronic venous insufficiency 09/03/2018  . Eczema 07/03/2018  . Allergic rhinitis 07/03/2018  . GERD (gastroesophageal reflux disease) 07/03/2018  . Vitamin D deficiency 07/03/2018  . Obesity (BMI 35.0-39.9 without comorbidity) 06/07/2018  . Uncontrolled persistent asthma 06/07/2018  . Anemia 06/07/2018  . Hematuria, microscopic 06/07/2018  . ADHD (attention deficit hyperactivity disorder), 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/2018  . Varicose veins of both lower extremities with pain 06/07/2018  . Adjustment disorder with mixed anxiety and depressed mood 10/10/2017  . History of self-harm 08/29/2017  . Vitiligo 12/08/2010  Current Outpatient Medications:  .  acyclovir (ZOVIRAX) 800 MG tablet, TAKE 1 TABLET BY MOUTH THREE TIMES DAILY FOR 2 DAYS WITH  OUTBREAKS,  ONE  DAILY  FOR  SUPPRESSION, Disp: 38 tablet, Rfl: 0 .  cetirizine (ZYRTEC ALLERGY) 10 MG tablet, Take 1 tablet (10 mg total) by mouth daily., Disp: 30 tablet, Rfl: 6 .  etonogestrel-ethinyl estradiol (NUVARING) 0.12-0.015 MG/24HR vaginal ring, Insert vaginally and leave in place for 3 consecutive weeks, then remove for 1 week., Disp: 3 each, Rfl: 3 .  ipratropium-albuterol (DUONEB) 0.5-2.5 (3) MG/3ML SOLN, Take 3 mLs by  nebulization every 4 (four) hours as needed., Disp: 360 mL, Rfl: 0 .  Respiratory Therapy Supplies (NEBULIZER/TUBING/MOUTHPIECE) KIT, Disp one nebulizer machine, tubing set and mouthpiece kit, Disp: 1 kit, Rfl: 0 .  VENTOLIN HFA 108 (90 Base) MCG/ACT inhaler, INHALE 2 PUFFS BY MOUTH EVERY 6 HOURS AS NEEDED FOR WHEEZE, SHORTNESS OF BREATH, CHEST TIGHTNESS, OR COUGHING FITS, Disp: 18 g, Rfl: 0   Allergies  Allergen Reactions  . Benadryl Allergy [Diphenhydramine Hcl]     Rapid heart rate.     Past Surgical History:  Procedure Laterality Date  . NO PAST SURGERIES       Family History  Problem Relation Age of Onset  . Heart disease Mother   . Hypertension Mother   . ADD / ADHD Father   . Hypertension Father   . Cancer Maternal Grandmother   . Heart disease Maternal Grandmother   . Diabetes Maternal Grandmother   . Ovarian cancer Maternal Grandmother   . Diabetes Maternal Grandfather   . Diabetes Paternal Grandmother   . Atrial fibrillation Paternal Grandmother   . AAA (abdominal aortic aneurysm) Paternal Grandmother   . Heart disease Paternal Grandmother   . Heart attack Paternal Grandfather   . Varicose Veins Paternal Grandfather   . Breast cancer Neg Hx      Social History   Tobacco Use  . Smoking status: Former Smoker    Packs/day: 0.50    Years: 7.00    Pack years: 3.50    Types: Cigarettes    Quit date: 01/17/2016    Years since quitting: 3.9  . Smokeless tobacco: Never Used  Substance Use Topics  . Alcohol use: Yes    Alcohol/week: 2.0 standard drinks    Types: 2 Glasses of wine per week    With staff assistance, above reviewed with the patient today.  ROS: As per HPI, otherwise no specific complaints on a limited and focused system review   No results found for this or any previous visit (from the past 72 hour(s)).   PHQ2/9: Depression screen [facility] 2/9 12/30/2019 12/31/2018 07/03/2018 06/07/2018  Decreased Interest 1 0 0 0  Down, Depressed, Hopeless 0 0 0  1  PHQ - 2 Score 1 0 0 1  Altered sleeping - 0 2 3  Tired, decreased energy - 0 1 0  Change in appetite - 0 0 1  Feeling bad or failure about yourself  - 0 0 3  Trouble concentrating - 0 1 3  Moving slowly or fidgety/restless - 0 0 0  Suicidal thoughts - 0 0 0  PHQ-9 Score - 0 4 11  Difficult doing work/chores - - Somewhat difficult Somewhat difficult   PHQ-2/9 Result reviewed GAD 7 : Generalized Anxiety Score 12/30/2019 07/03/2018  Nervous, Anxious, on Edge 3 1  Control/stop worrying 3 1  Worry too much - different things 3 1  Trouble relaxing 3 1  Restless 2 0  Easily annoyed or irritable 3 0  Afraid - awful might happen 3 0  Total GAD 7 Score 20 4  Anxiety Difficulty Very difficult Not difficult at all    GAD7 reviewed   Fall Risk: Fall Risk  12/30/2019 12/31/2018 06/07/2018  Falls in the past year? 0 0 0  Number falls in past yr: 0 0 0  Injury with Fall? 0 0 0  Follow up Falls evaluation completed - -      Objective:   Vitals:   12/30/19 1358  BP: 108/70  Pulse: 95  Resp: 16  Temp: 98.1 F (36.7 C)  TempSrc: Oral  SpO2: 99%  Weight: 269 lb 14.4 oz (122.4 kg)  Height: 5' 10.5" (1.791 m)    Body mass index is 38.18 kg/m.  Physical Exam   NAD, masked, pleasant HEENT - [place]/AT, sclera anicteric, PERRL, positive glasses, EOMI, conj - non-inj'ed, pharynx clear Neck - supple, no adenopathy, no TM, carotids 2+ and = without bruits bilat Car - RRR without m/g/r Pulm- RR and effort normal at rest, CTA without wheeze or rales Abd - soft, obese, NT, ND, BS+,  no masses Back - no CVA tenderness Skin-scattered tattoos noted Ext - no LE edema,  Neuro/psychiatric - affect was not flat, appropriate with conversation  Alert and oriented  Grossly non-focal - good strength on testing extremities, sensation intact to LT in distal extremities  Speech normal   Results for orders placed or performed in visit on 08/02/19  NuSwab Vaginitis Plus (VG+)  Result Value Ref Range     Atopobium vaginae High - 2 (A) Score   BVAB 2 High - 2 (A) Score   Megasphaera 1 High - 2 (A) Score   Candida albicans, NAA Negative Negative   Candida glabrata, NAA Negative Negative   Trich vag by NAA Negative Negative   Chlamydia trachomatis, NAA Negative Negative   Neisseria gonorrhoeae, NAA Negative Negative  Surgical pathology  Result Value Ref Range   SURGICAL PATHOLOGY      SURGICAL PATHOLOGY CASE: [id number] PATIENT: Ilyanna Wonder Surgical Pathology Report     Clinical History: LGSIL (cm)     FINAL MICROSCOPIC DIAGNOSIS:  A. CERVIX, 12 O'CLOCK, BIOPSY: - Low-grade squamous intraepithelial lesion, CIN-1.  B. ENDOCERVIX, CURETTAGE: - Benign endocervical mucosa.  GROSS DESCRIPTION: A.  Received in formalin is a tan, soft tissue fragment that is submitted in toto.  Size: 0.6 cm, 1 block submitted. B.  Received in formalin is blood tinged mucus that is entirely submitted in one block.  Volume: 0.8 x 0.5 x 0.1 cm (1 B) (AK 08/05/2019)   Final Diagnosis performed by Claudette Laws, MD.   Electronically signed 08/06/2019 Technical component performed at [facility]. [facility], [place] [address], [place], [place] [address].  Professional component performed at [facility], [place] [address] [place], [place] [address].  Immunohistochemistry Technical component (if applicable) was performed at [facility] [address] [place], [place], [place] [address].   IMMUNOHISTOCHEMISTRY DISCLAIMER (if applicable): Some of these immunohistochemical stains may have been developed and the performance characteristics determine by [facility]. Some may not have been cleared or approved by the U.S. Food and Drug Administration. The FDA has determined that such clearance or approval is not necessary. This test is used for clinical purposes. It should not be regarded as investigational or for research. This laboratory  is certified under the [place] (  CLIA-88) as qualified to perform high complexity clinical laboratory testing.  The controls stained appropriately.        Assessment & Plan:   1. Generalized anxiety disorder 2. Depression with anxiety PHQ and GAD 67 reviewed Do feel anxiety is much more the issue presently as we discussed.  She agreed. Educated on anxiety disorder, and why daily medication felt best (help lower generalized anxiety level and lessen height of peaks to better control symptoms) Medications reviewed including risk/benefits and potential side effects   We will start sertraline, 50 mg daily.  Noted can upset the stomach some early on, and often goes away after on this for a few days. Also noted takes weeks not days to truly assess the benefit, and await her response. Also recommended returning to counseling as also can be helpful in addition to medicine, and noted the 2 together, counseling and medicine, often work better than either alone.  A list of some counselors in this area were provided to help.  Did recommend she contact her insurance company to ensure there is some help for coverage with a counselor she chooses.  Another option is to return to the one she had been using previously. - sertraline (ZOLOFT) 50 MG tablet; Take 1 tablet (50 mg total) by mouth daily.  Dispense: 30 tablet; Refill: 1  3. Chronic bilateral low back pain without sciatica She notes the back pain is more tightness, and do feel it is related to the anxiety issues likely. Discussed the muscle relaxer that sometimes can be helpful, and do feel would be worth starting at nighttime to 10 [place] help her sleep, as he is battling some insomnia concerns. Recommended she take it regularly for 5 to 7-day timeframe, then use more as needed, and await her response. - cyclobenzaprine (FLEXERIL) 5 MG tablet; Take 1 tablet (5 mg total) by mouth at bedtime as needed for muscle  spasms. May increase to two tabs at bedtime pending response  Dispense: 30 tablet; Refill: 1  4. Psychophysiological insomnia As above, will use a Flexeril product to help both with some back muscle tightness and spasm, and also will help her sleep at night. It is hoped these symptoms improve as her anxiety levels improve If over time, this has not been particularly helpful, may consider a product like trazodone in the future. - cyclobenzaprine (FLEXERIL) 5 MG tablet; Take 1 tablet (5 mg total) by mouth at bedtime as needed for muscle spasms. May increase to two tabs at bedtime pending response  Dispense: 30 tablet; Refill: 1   Reviewed the last labs, and her comp panel about a year ago was entirely normal.  Last TSH was normal.  Discussed potentially rechecking some labs again in the near future, and we will hold off on doing so today. We will tentatively schedule follow-up in 4 weeks time, follow-up sooner as needed.    Towanda Malkin, MD 12/30/19 2:13 PM

## 2019-12-30 NOTE — Patient Instructions (Signed)
Prescription medications have been sent to the pharmacy to start.  Recommend obtaining counseling in combination with the medicines to help.

## 2020-01-07 ENCOUNTER — Telehealth: Payer: Self-pay

## 2020-01-07 NOTE — Telephone Encounter (Signed)
Copied from CRM [id number]. Topic: General - Other >> Jan 06, 2020 11:57 AM Dalphine Handing A wrote: Patient was seen on 9/13 and was advised by Dr. Dorris Fetch that her albuterol medication was called in. Patient contacted pharmacy and they still do not have prescription. Patient is requesting a callback from nurse once medication has been sent to pharmacy today. Please advise
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
## 2020-01-23 ENCOUNTER — Ambulatory Visit (INDEPENDENT_AMBULATORY_CARE_PROVIDER_SITE_OTHER): Payer: BC Managed Care – PPO | Admitting: Family Medicine

## 2020-01-23 ENCOUNTER — Other Ambulatory Visit: Payer: Self-pay

## 2020-01-23 ENCOUNTER — Encounter: Payer: Self-pay | Admitting: Family Medicine

## 2020-01-23 VITALS — BP 110/74 | HR 96 | Temp 98.3°F | Resp 16 | Ht 71.0 in | Wt 271.9 lb

## 2020-01-23 DIAGNOSIS — E669 Obesity, unspecified: Secondary | ICD-10-CM

## 2020-01-23 DIAGNOSIS — M545 Low back pain, unspecified: Secondary | ICD-10-CM

## 2020-01-23 DIAGNOSIS — J4520 Mild intermittent asthma, uncomplicated: Secondary | ICD-10-CM | POA: Diagnosis not present

## 2020-01-23 DIAGNOSIS — F418 Other specified anxiety disorders: Secondary | ICD-10-CM | POA: Diagnosis not present

## 2020-01-23 DIAGNOSIS — F900 Attention-deficit hyperactivity disorder, predominantly inattentive type: Secondary | ICD-10-CM

## 2020-01-23 DIAGNOSIS — F5104 Psychophysiologic insomnia: Secondary | ICD-10-CM

## 2020-01-23 DIAGNOSIS — G8929 Other chronic pain: Secondary | ICD-10-CM

## 2020-01-23 MED ORDER — VENTOLIN HFA 108 (90 BASE) MCG/ACT IN AERS: INHALATION_SPRAY | RESPIRATORY_TRACT | 3 refills | Status: DC

## 2020-01-23 MED ORDER — HYDROXYZINE PAMOATE 25 MG PO CAPS: 25.0000 mg | ORAL_CAPSULE | Freq: Three times a day (TID) | ORAL | 1 refills | Status: DC | PRN

## 2020-01-23 MED ORDER — SERTRALINE HCL 50 MG PO TABS: ORAL_TABLET | ORAL | 1 refills | Status: DC

## 2020-01-23 MED ORDER — CYCLOBENZAPRINE HCL 5 MG PO TABS: ORAL_TABLET | ORAL | 1 refills | Status: DC

## 2020-01-23 MED ORDER — CETIRIZINE HCL 10 MG PO TABS: 10.0000 mg | ORAL_TABLET | Freq: Every day | ORAL | 11 refills | Status: DC

## 2020-01-23 MED ORDER — BUSPIRONE HCL 10 MG PO TABS: 10.0000 mg | ORAL_TABLET | Freq: Three times a day (TID) | ORAL | 2 refills | Status: DC

## 2020-01-28 DIAGNOSIS — G5622 Lesion of ulnar nerve, left upper limb: Secondary | ICD-10-CM | POA: Diagnosis not present

## 2020-01-28 DIAGNOSIS — G5603 Carpal tunnel syndrome, bilateral upper limbs: Secondary | ICD-10-CM | POA: Diagnosis not present

## 2020-02-21 ENCOUNTER — Ambulatory Visit (INDEPENDENT_AMBULATORY_CARE_PROVIDER_SITE_OTHER): Payer: BC Managed Care – PPO | Admitting: Family Medicine

## 2020-02-21 ENCOUNTER — Encounter: Payer: Self-pay | Admitting: Family Medicine

## 2020-02-21 ENCOUNTER — Other Ambulatory Visit: Payer: Self-pay

## 2020-02-21 VITALS — BP 120/78 | HR 98 | Temp 98.1°F | Resp 18 | Ht 71.0 in | Wt 269.5 lb

## 2020-02-21 DIAGNOSIS — F5104 Psychophysiologic insomnia: Secondary | ICD-10-CM

## 2020-02-21 DIAGNOSIS — F411 Generalized anxiety disorder: Secondary | ICD-10-CM | POA: Diagnosis not present

## 2020-02-21 DIAGNOSIS — Z9152 Personal history of nonsuicidal self-harm: Secondary | ICD-10-CM

## 2020-02-21 DIAGNOSIS — F418 Other specified anxiety disorders: Secondary | ICD-10-CM

## 2020-02-21 DIAGNOSIS — F900 Attention-deficit hyperactivity disorder, predominantly inattentive type: Secondary | ICD-10-CM | POA: Diagnosis not present

## 2020-02-21 DIAGNOSIS — F4323 Adjustment disorder with mixed anxiety and depressed mood: Secondary | ICD-10-CM

## 2020-02-21 DIAGNOSIS — M722 Plantar fascial fibromatosis: Secondary | ICD-10-CM

## 2020-02-21 MED ORDER — MELOXICAM 15 MG PO TABS: 15.0000 mg | ORAL_TABLET | Freq: Every day | ORAL | 0 refills | Status: DC

## 2020-02-21 MED ORDER — SERTRALINE HCL 100 MG PO TABS: 150.0000 mg | ORAL_TABLET | Freq: Every day | ORAL | 3 refills | Status: DC

## 2020-02-21 NOTE — Progress Notes (Signed)
Patient ID: Diane Williamson, female    DOB: [birth year], 29 y.o.   MRN: [id number]  PCP: Delsa Grana, PA-C  Chief Complaint  Patient presents with  . Anxiety    Subjective:   Diane Williamson is a 29 y.o. female, presents to clinic with CC of the following:  HPI  F/up - past OV A&P, interval history in bold 1. Depression with anxiety  Some improvement  In sx with dose change  She did not get a psychiatrist appt, she's too busy with work Patient has diagnoses on her chart in the past of ADHD, she does seem hyperactive, history of self-harm, adjustment disorder, she seems extremely stressed and anxious with psychomotor symptoms.  Zoloft 50 mg did not seem to help at all.  She denies ever being on medication in the past and typically was very high functioning and coping, but everything got much worse this year. Strongly recommended that she be evaluated with formal assessment by a psychiatrist -given her many symptoms and intensity of her symptoms.  For now we will increase the dose of Zoloft, she can try as needed BuSpar or hydroxyzine I explained to her that she will absolutely need to see a specialist -she declined to have me order a referral but states that she will find a psychiatrist to see -encouraged her to look at in network providers and let me know if she needs a referral.  - sertraline (ZOLOFT) 50 MG tablet; Increase to 100 mg po daily x 2 weeks, then increase to 150 mg po daily  Dispense: 90 tablet; Refill: 1 - hydrOXYzine (VISTARIL) 25 MG capsule; Take 1 capsule (25 mg total) by mouth every 8 (eight) hours as needed for anxiety.  Dispense: 30 capsule; Refill: 1 - busPIRone (BUSPAR) 10 MG tablet; Take 1 tablet (10 mg total) by mouth 3 (three) times daily.  Dispense: 90 tablet; Refill: 2  2. Chronic bilateral low back pain without sciatica  Flexeril stopped being helpful, but she is very active w/o much mobility issued or limitations from back, on her feet and on the go  non-stop She states her back pain is gradually improving and Flexeril at night has been helpful.  - cyclobenzaprine (FLEXERIL) 5 MG tablet; 5 mg PO TID PRN for muscle spasms/cramps, may take 5-10 at bedtime PRN  Dispense: 60 tablet; Refill: 1  3. Psychophysiological insomnia  Not helping as much with sleep Using Flexeril at night for her back has helped her with her sleep, explained to her that insomnia is very psychologically complex she should work on exercising, doing therapy, journaling, finding other healthy coping skills for her stress and anxiety also work with a therapist or a psychiatrist will help with this.  Defer any additional insomnia medications to psychiatry  - cyclobenzaprine (FLEXERIL) 5 MG tablet; 5 mg PO TID PRN for muscle spasms/cramps, may take 5-10 at bedtime PRN  Dispense: 60 tablet; Refill: 1 - sertraline (ZOLOFT) 50 MG tablet; Increase to 100 mg po daily x 2 weeks, then increase to 150 mg po daily  Dispense: 90 tablet; Refill: 1 - hydrOXYzine (VISTARIL) 25 MG capsule; Take 1 capsule (25 mg total) by mouth every 8 (eight) hours as needed for anxiety.  Dispense: 30 capsule; Refill: 1  4. Intermittent asthma without complication, unspecified asthma severity  Sx currently well controlled  She typically has worse allergies and asthma symptoms in the fall, currently using her inhaler 2-3 times a week she states for whatever reason the Flexeril  is helping, this may be helping to treat other triggers possibly pain or anxiety triggering asthma symptoms? - VENTOLIN HFA 108 (90 Base) MCG/ACT inhaler; INHALE 2 PUFFS BY MOUTH EVERY 6 HOURS AS NEEDED FOR WHEEZE, SHORTNESS OF BREATH, CHEST TIGHTNESS, OR COUGHING FITS  Dispense: 18 g; Refill: 3 - cetirizine (ZYRTEC ALLERGY) 10 MG tablet; Take 1 tablet (10 mg total) by mouth daily.  Dispense: 30 tablet; Refill: 11  5. ADHD (attention deficit hyperactivity disorder), inattentive type - not much change History of ADHD definitely seem to  be a factor in relationship to her physical symptoms, her inability to sit still she states, also untreated ADHD may be a trigger for additional anxiety when she feels panicked about any task or responsibilities -again encouraged her to seek the assessment of a specialist psychiatrist  Depression screen [facility] 2/9 02/21/2020 01/23/2020 12/30/2019  Decreased Interest 0 0 1  Down, Depressed, Hopeless 0 0 0  PHQ - 2 Score 0 0 1  Altered sleeping 1 3 -  Tired, decreased energy 2 2 -  Change in appetite 2 3 -  Feeling bad or failure about yourself  0 0 -  Trouble concentrating 0 2 -  Moving slowly or fidgety/restless 0 3 -  Suicidal thoughts 0 0 -  PHQ-9 Score 5 13 -  Difficult doing work/chores Not difficult at all Somewhat difficult -   GAD 7 : Generalized Anxiety Score 02/21/2020 01/23/2020 12/30/2019 07/03/2018  Nervous, Anxious, on Edge _0 Control/stop worrying 0 _1 Worry too much - different things 0 _2 Trouble relaxing _3 Restless _4 0  Easily annoyed or irritable _5 0  Afraid - awful might happen 0 0 3 0  Total GAD 7 Score _6 Anxiety Difficulty - Somewhat difficult Very difficult Not difficult at all   On zoloft 150 mg feeling better, phq and gad 7 scores reviewed  Lost some weight  Feet hurt, every time she goes to walk after any period of immobility she has sharp pain to bottom of both feet, worst first thing in the morning    Patient Active Problem List   Diagnosis Date Noted  . Generalized anxiety disorder 12/30/2019  . Depression with anxiety 12/30/2019  . Chronic bilateral low back pain without sciatica 12/30/2019  . Psychophysiological insomnia 12/30/2019  . Dysplasia of cervix, low grade (CIN 1) 07/04/2019  . Chronic venous insufficiency 09/03/2018  . Eczema 07/03/2018  . Allergic rhinitis 07/03/2018  . GERD (gastroesophageal reflux disease) 07/03/2018  . Vitamin D deficiency 07/03/2018  . Obesity (BMI 35.0-39.9 without comorbidity)  06/07/2018  . Uncontrolled persistent asthma 06/07/2018  . Anemia 06/07/2018  . Hematuria, microscopic 06/07/2018  . ADHD (attention deficit hyperactivity disorder), 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/2018  . Varicose veins of both lower extremities with pain 06/07/2018  . Adjustment disorder with mixed anxiety and depressed mood 10/10/2017  . Personal history of nonsuicidal self-harm 08/29/2017  . Vitiligo 12/08/2010      Current Outpatient Medications:  .  acyclovir (ZOVIRAX) 800 MG tablet, TAKE 1 TABLET BY MOUTH THREE TIMES DAILY FOR 2 DAYS WITH  OUTBREAKS,  ONE  DAILY  FOR  SUPPRESSION, Disp: 38 tablet, Rfl: 0 .  busPIRone (BUSPAR) 10 MG tablet, Take 1 tablet (10 mg total) by mouth 3 (three) times daily., Disp: 90 tablet, Rfl: 2 .  cetirizine (ZYRTEC ALLERGY) 10 MG tablet, Take 1 tablet (10 mg  total) by mouth daily., Disp: 30 tablet, Rfl: 11 .  cyclobenzaprine (FLEXERIL) 5 MG tablet, 5 mg PO TID PRN for muscle spasms/cramps, may take 5-10 at bedtime PRN, Disp: 60 tablet, Rfl: 1 .  etonogestrel-ethinyl estradiol (NUVARING) 0.12-0.015 MG/24HR vaginal ring, Insert vaginally and leave in place for 3 consecutive weeks, then remove for 1 week., Disp: 3 each, Rfl: 3 .  ipratropium-albuterol (DUONEB) 0.5-2.5 (3) MG/3ML SOLN, Take 3 mLs by nebulization every 4 (four) hours as needed., Disp: 360 mL, Rfl: 0 .  meloxicam (MOBIC) 15 MG tablet, Take 1 tablet (15 mg total) by mouth daily., Disp: 30 tablet, Rfl: 0 .  Respiratory Therapy Supplies (NEBULIZER/TUBING/MOUTHPIECE) KIT, Disp one nebulizer machine, tubing set and mouthpiece kit (Patient not taking: Reported on 01/23/2020), Disp: 1 kit, Rfl: 0 .  sertraline (ZOLOFT) 100 MG tablet, Take 1.5 tablets (150 mg total) by mouth daily., Disp: 135 tablet, Rfl: 3 .  VENTOLIN HFA 108 (90 Base) MCG/ACT inhaler, INHALE 2 PUFFS BY MOUTH EVERY 6 HOURS AS NEEDED FOR WHEEZE, SHORTNESS OF BREATH, CHEST TIGHTNESS, OR COUGHING FITS, Disp: 18 g, Rfl: 3   Allergies  Allergen  Reactions  . Benadryl Allergy [Diphenhydramine Hcl]     Rapid heart rate.     Social History   Tobacco Use  . Smoking status: Former Smoker    Packs/day: 0.50    Years: 7.00    Pack years: 3.50    Types: Cigarettes    Quit date: 01/17/2016    Years since quitting: 4.0  . Smokeless tobacco: Never Used  Vaping Use  . Vaping Use: Never used  Substance Use Topics  . Alcohol use: Yes    Alcohol/week: 2.0 standard drinks    Types: 2 Glasses of wine per week  . Drug use: No      Chart Review Today: I personally reviewed active problem list, medication list, allergies, family history, social history, health maintenance, notes from last encounter, lab results, imaging with the patient/caregiver today.   Review of Systems 10 Systems reviewed and are negative for acute change except as noted in the HPI.     Objective:   Vitals:   02/21/20 1508  BP: 120/78  Pulse: 98  Resp: 18  Temp: 98.1 F (36.7 C)  TempSrc: Oral  SpO2: 99%  Weight: 269 lb 8 oz (122.2 kg)  Height: _0  (1.803 m)    Body mass index is 37.59 kg/m.  Physical Exam Vitals and nursing note reviewed.  Constitutional:      General: She is not in acute distress.    Appearance: Normal appearance. She is well-developed. She is obese. She is not ill-appearing, toxic-appearing or diaphoretic.  HENT:     Head: Normocephalic and atraumatic.     Nose: Nose normal.  Eyes:     General:        Right eye: No discharge.        Left eye: No discharge.     Conjunctiva/sclera: Conjunctivae normal.  Neck:     Trachea: No tracheal deviation.  Cardiovascular:     Rate and Rhythm: Normal rate and regular rhythm.  Pulmonary:     Effort: Pulmonary effort is normal. No respiratory distress.     Breath sounds: No stridor.  Musculoskeletal:        General: Normal range of motion.  Skin:    General: Skin is warm and dry.     Findings: No rash.  Neurological:     Mental Status: She is alert.  Mental status is at  baseline.     Motor: No abnormal muscle tone.     Coordination: Coordination normal.     Gait: Gait normal.  Psychiatric:        Attention and Perception: Attention normal.        Mood and Affect: Mood normal.        Speech: Speech normal.        Behavior: Behavior normal.        Thought Content: Thought content normal. Thought content does not include homicidal or suicidal ideation. Thought content does not include homicidal or suicidal plan.      Results for orders placed or performed in visit on 08/02/19  NuSwab Vaginitis Plus (VG+)  Result Value Ref Range   Atopobium vaginae High - 2 (A) Score   BVAB 2 High - 2 (A) Score   Megasphaera 1 High - 2 (A) Score   Candida albicans, NAA Negative Negative   Candida glabrata, NAA Negative Negative   Trich vag by NAA Negative Negative   Chlamydia trachomatis, NAA Negative Negative   Neisseria gonorrhoeae, NAA Negative Negative  Surgical pathology  Result Value Ref Range   SURGICAL PATHOLOGY      SURGICAL PATHOLOGY CASE: [id number] PATIENT: Tiarra Amesquita Surgical Pathology Report     Clinical History: LGSIL (cm)     FINAL MICROSCOPIC DIAGNOSIS:  A. CERVIX, 12 O'CLOCK, BIOPSY: - Low-grade squamous intraepithelial lesion, CIN-1.  B. ENDOCERVIX, CURETTAGE: - Benign endocervical mucosa.  GROSS DESCRIPTION: A.  Received in formalin is a tan, soft tissue fragment that is submitted in toto.  Size: 0.6 cm, 1 block submitted. B.  Received in formalin is blood tinged mucus that is entirely submitted in one block.  Volume: 0.8 x 0.5 x 0.1 cm (1 B) (AK 08/05/2019)   Final Diagnosis performed by Claudette Laws, MD.   Electronically signed 08/06/2019 Technical component performed at [facility]. [facility], [place] [address], [place], [place] [address].  Professional component performed at [facility], [place] [address], [place], [place] [address].  Immunohistochemistry Technical component (if applicable) was  performed at [facility]. [address] [place], [place], [place] [address].   IMMUNOHISTOCHEMISTRY DISCLAIMER (if applicable): Some of these immunohistochemical stains may have been developed and the performance characteristics determine by [facility]. Some may not have been cleared or approved by the U.S. Food and Drug Administration. The FDA has determined that such clearance or approval is not necessary. This test is used for clinical purposes. It should not be regarded as investigational or for research. This laboratory is certified under the [place] (CLIA-88) as qualified to perform high complexity clinical laboratory testing.  The controls stained appropriately.        Assessment & Plan:   1. Generalized anxiety disorder Improving with dose increase Buspar 10 mg Prn is also helpful for her No significant benefit with hydroxyzine - taken off chart Have still advised she need psychiatry and likely a therapist - encouraged her again to get established.  I explained that if she has another flare of sx, or increased situational stress, I have very little else to offer her, but psychiatry and team of specialists will Strongly encouraged her to work est w psychiatry now - sertraline (ZOLOFT) 100 MG tablet; Take 1.5 tablets (150 mg total) by mouth daily.  Dispense: 135 tablet; Refill: 3  2. Depression with anxiety See above - sertraline (ZOLOFT) 100 MG  tablet; Take 1.5 tablets (150 mg total) by mouth daily.  Dispense: 135 tablet; Refill: 3  3. Psychophysiological insomnia Not much improvement - sertraline (ZOLOFT) 100 MG tablet; Take 1.5 tablets (150 mg total) by mouth daily.  Dispense: 135 tablet; Refill: 3  4. ADHD (attention deficit hyperactivity disorder), inattentive type Unchanged, chronic - will need psych  5. Adjustment disorder with mixed anxiety and depressed mood Hx of - complicated  psychiatric health hx - needs specialists  6. Personal history of nonsuicidal self-harm No self harm in about 4 years  7. Plantar fasciitis Likely her foot pain is increased activity with obesity causing some plantar fasciitis - reviewed conservative measures, can try mobic, offered podiatry referral but she declined - meloxicam (MOBIC) 15 MG tablet; Take 1 tablet (15 mg total) by mouth daily.  Dispense: 30 tablet; Refill: 0      Delsa Grana, PA-C 02/21/20 6:08 PM

## 2020-02-21 NOTE — Patient Instructions (Signed)
Plantar Fasciitis  Plantar fasciitis is a painful foot condition that affects the heel. It occurs when the band of tissue that connects the toes to the heel bone (plantar fascia) becomes irritated. This can happen as the result of exercising too much or doing other repetitive activities (overuse injury). The pain from plantar fasciitis can range from mild irritation to severe pain that makes it difficult to walk or move. The pain is usually worse in the morning after sleeping, or after sitting or lying down for a while. Pain may also be worse after long periods of walking or standing. What are the causes? This condition may be caused by:  Standing for long periods of time.  Wearing shoes that do not have good arch support.  Doing activities that put stress on joints (high-impact activities), including running, aerobics, and ballet.  Being overweight.  An abnormal way of walking (gait).  Tight muscles in the back of your lower leg (calf).  High arches in your feet.  Starting a new athletic activity. What are the signs or symptoms? The main symptom of this condition is heel pain. Pain may:  Be worse with first steps after a time of rest, especially in the morning after sleeping or after you have been sitting or lying down for a while.  Be worse after long periods of standing still.  Decrease after 30-45 minutes of activity, such as gentle walking. How is this diagnosed? This condition may be diagnosed based on your medical history and your symptoms. Your health care provider may ask questions about your activity level. Your health care provider will do a physical exam to check for:  A tender area on the bottom of your foot.  A high arch in your foot.  Pain when you move your foot.  Difficulty moving your foot. You may have imaging tests to confirm the diagnosis, such as:  X-rays.  Ultrasound.  MRI. How is this treated? Treatment for plantar fasciitis depends on how  severe your condition is. Treatment may include:  Rest, ice, applying pressure (compression), and raising the affected foot (elevation). This may be called RICE therapy. Your health care provider may recommend RICE therapy along with over-the-counter pain medicines to manage your pain.  Exercises to stretch your calves and your plantar fascia.  A splint that holds your foot in a stretched, upward position while you sleep (night splint).  Physical therapy to relieve symptoms and prevent problems in the future.  Injections of steroid medicine (cortisone) to relieve pain and inflammation.  Stimulating your plantar fascia with electrical impulses (extracorporeal shock wave therapy). This is usually the last treatment option before surgery.  Surgery, if other treatments have not worked after 12 months. Follow these instructions at home:  Managing pain, stiffness, and swelling  If directed, put ice on the painful area: ? Put ice in a plastic bag, or use a frozen bottle of water. ? Place a towel between your skin and the bag or bottle. ? Roll the bottom of your foot over the bag or bottle. ? Do this for 20 minutes, 2-3 times a day.  Wear athletic shoes that have air-sole or gel-sole cushions, or try wearing soft shoe inserts that are designed for plantar fasciitis.  Raise (elevate) your foot above the level of your heart while you are sitting or lying down. Activity  Avoid activities that cause pain. Ask your health care provider what activities are safe for you.  Do physical therapy exercises and stretches as told   by your health care provider.  Try activities and forms of exercise that are easier on your joints (low-impact). Examples include swimming, water aerobics, and biking. General instructions  Take over-the-counter and prescription medicines only as told by your health care provider.  Wear a night splint while sleeping, if told by your health care provider. Loosen the splint  if your toes tingle, become numb, or turn cold and blue.  Maintain a healthy weight, or work with your health care provider to lose weight as needed.  Keep all follow-up visits as told by your health care provider. This is important. Contact a health care provider if you:  Have symptoms that do not go away after caring for yourself at home.  Have pain that gets worse.  Have pain that affects your ability to move or do your daily activities. Summary  Plantar fasciitis is a painful foot condition that affects the heel. It occurs when the band of tissue that connects the toes to the heel bone (plantar fascia) becomes irritated.  The main symptom of this condition is heel pain that may be worse after exercising too much or standing still for a long time.  Treatment varies, but it usually starts with rest, ice, compression, and elevation (RICE therapy) and over-the-counter medicines to manage pain. This information is not intended to replace advice given to you by your health care provider. Make sure you discuss any questions you have with your health care provider. Document Revised: 03/17/2017 Document Reviewed: 01/30/2017 Elsevier Patient Education  2020 [employer]  

## 2020-05-11 ENCOUNTER — Telehealth: Payer: Self-pay | Admitting: Licensed Clinical Social Worker

## 2020-05-11 NOTE — Telephone Encounter (Signed)
Patient left vm on 05/07/20 requesting appt.

## 2020-08-21 ENCOUNTER — Ambulatory Visit: Payer: BC Managed Care – PPO | Admitting: Family Medicine

## 2020-09-23 ENCOUNTER — Encounter: Payer: Self-pay | Admitting: Physician Assistant

## 2020-09-23 NOTE — Progress Notes (Signed)
Received refill request for patient for NuvaRing.  Per chart review, patient had RP 06/26/2019 and Rx for NuvaRing sent to patient's pharmacy.  Patient needs an annual RP visit.  Will OK one refill.  Form faxed and fax confirmation received.

## 2020-10-12 DIAGNOSIS — M546 Pain in thoracic spine: Secondary | ICD-10-CM | POA: Diagnosis not present

## 2020-10-12 DIAGNOSIS — M542 Cervicalgia: Secondary | ICD-10-CM | POA: Diagnosis not present

## 2020-10-12 DIAGNOSIS — R519 Headache, unspecified: Secondary | ICD-10-CM | POA: Diagnosis not present

## 2020-10-30 DIAGNOSIS — M546 Pain in thoracic spine: Secondary | ICD-10-CM | POA: Diagnosis not present

## 2020-10-30 DIAGNOSIS — M542 Cervicalgia: Secondary | ICD-10-CM | POA: Diagnosis not present

## 2020-10-30 DIAGNOSIS — R519 Headache, unspecified: Secondary | ICD-10-CM | POA: Diagnosis not present

## 2020-12-29 ENCOUNTER — Ambulatory Visit: Payer: Self-pay | Admitting: Family Medicine

## 2021-01-01 ENCOUNTER — Ambulatory Visit (INDEPENDENT_AMBULATORY_CARE_PROVIDER_SITE_OTHER): Payer: BC Managed Care – PPO | Admitting: Nurse Practitioner

## 2021-01-01 ENCOUNTER — Encounter: Payer: Self-pay | Admitting: Nurse Practitioner

## 2021-01-01 ENCOUNTER — Other Ambulatory Visit: Payer: Self-pay

## 2021-01-01 VITALS — BP 124/80 | HR 88 | Temp 98.2°F | Resp 18 | Ht 71.0 in | Wt 307.7 lb

## 2021-01-01 DIAGNOSIS — F5104 Psychophysiologic insomnia: Secondary | ICD-10-CM | POA: Diagnosis not present

## 2021-01-01 DIAGNOSIS — Z3009 Encounter for other general counseling and advice on contraception: Secondary | ICD-10-CM

## 2021-01-01 DIAGNOSIS — E669 Obesity, unspecified: Secondary | ICD-10-CM | POA: Diagnosis not present

## 2021-01-01 DIAGNOSIS — J4520 Mild intermittent asthma, uncomplicated: Secondary | ICD-10-CM

## 2021-01-01 DIAGNOSIS — F418 Other specified anxiety disorders: Secondary | ICD-10-CM | POA: Diagnosis not present

## 2021-01-01 DIAGNOSIS — F411 Generalized anxiety disorder: Secondary | ICD-10-CM

## 2021-01-01 MED ORDER — NOVOFINE PLUS PEN NEEDLE 32G X 4 MM MISC: 1.0000 | Freq: Every day | 0 refills | Status: DC

## 2021-01-01 MED ORDER — VENTOLIN HFA 108 (90 BASE) MCG/ACT IN AERS: INHALATION_SPRAY | RESPIRATORY_TRACT | 3 refills | Status: DC

## 2021-01-01 MED ORDER — ETONOGESTREL-ETHINYL ESTRADIOL 0.12-0.015 MG/24HR VA RING: VAGINAL_RING | VAGINAL | 3 refills | Status: DC

## 2021-01-01 MED ORDER — NOVOFINE PLUS PEN NEEDLE 32G X 4 MM MISC: 1.0000 | Freq: Every day | 3 refills | Status: DC

## 2021-01-01 MED ORDER — SAXENDA 18 MG/3ML ~~LOC~~ SOPN: 0.6000 mg | PEN_INJECTOR | SUBCUTANEOUS | 4 refills | Status: DC

## 2021-01-01 MED ORDER — SERTRALINE HCL 100 MG PO TABS: 200.0000 mg | ORAL_TABLET | Freq: Every day | ORAL | 3 refills | Status: DC

## 2021-01-01 NOTE — Addendum Note (Signed)
Addended by: Della Goo F on: 01/01/2021 11:22 AM   Modules accepted: Orders

## 2021-01-01 NOTE — Progress Notes (Signed)
BP 124/80   Pulse 88   Temp 98.2 F (36.8 C) (Oral)   Resp 18   Ht 5\' 11"  (1.803 m)   Wt (!) 307 lb 11.2 oz (139.6 kg)   LMP 01/01/2021   SpO2 97%   BMI 42.92 kg/m    Subjective:    Patient ID: Diane Williamson, female    DOB: 09-Feb-1991, 30 y.o.   MRN: [id number]  HPI: Diane Williamson is a 30 y.o. female, here alone  Chief Complaint  Patient presents with   Anxiety    Medication refill   Obesity    Discuss weight gain   Contraception    Refill medication    Anxiety/Depression: Currently taking 200 mg Zoloft.  She says that it has really helped with her anxiety. GAD 7 score 0. Her depression screening is a 4.  She says she has not had any anxiety attacks and feels her anxiety is under control at this time. She has not started counseling yet but has signed up through her work and will start soon.  Insomnia:  She says she has a hard time falling asleep.  She says she gets about 5-6 hours of sleep a night. She says she has tried melatonin 5mg  but did not like how she felt in the morning.  She will try a lower dose.   Obesity:  Today's weight is 307 lbs, BMI 42.92.  This is her highest weight.  She has gained 37 lbs since last visit in 02/2020.  She has tried vegan diet, gym, personal trainer, nutrition classes, counting calories with little success.  Discussed eating habits.  She has been drinking chocolate milk and soda.  Discussed decreasing sugar intake.  Discussed keeping food journal.  Discussed weight loss medication.  She would like to try saxenda.  Discussed possible side effects and usage.    Asthma:  Currently using albuterol.  She denies any recent asthma attacks.  She stays away from triggers.  Will refill prescription.   GAD 7 : Generalized Anxiety Score 01/01/2021 02/21/2020 01/23/2020 12/30/2019  Nervous, Anxious, on Edge 0 1 3 3   Control/stop worrying 0 0 3 3  Worry too much - different things 0 0 3 3  Trouble relaxing 0 2 3 3   Restless 0 1 3 2   Easily annoyed or  irritable 0 2 2 3   Afraid - awful might happen 0 0 0 3  Total GAD 7 Score 0 6 17 20   Anxiety Difficulty Not difficult at all - Somewhat difficult Very difficult     Depression screen [facility] 2/9 01/01/2021 02/21/2020 01/23/2020 12/30/2019 12/31/2018  Decreased Interest 0 0 0 1 0  Down, Depressed, Hopeless 0 0 0 0 0  PHQ - 2 Score 0 0 0 1 0  Altered sleeping 2 1 3  - 0  Tired, decreased energy 0 2 2 - 0  Change in appetite 2 2 3  - 0  Feeling bad or failure about yourself  0 0 0 - 0  Trouble concentrating 0 0 2 - 0  Moving slowly or fidgety/restless 0 0 3 - 0  Suicidal thoughts 0 0 0 - 0  PHQ-9 Score 4 5 13  - 0  Difficult doing work/chores Not difficult at all Not difficult at all Somewhat difficult - -    Relevant past medical, surgical, family and social history reviewed and updated as indicated. Interim medical history since our last visit reviewed. Allergies and medications reviewed and updated.  Review of Systems  Constitutional: Negative for fever or weight change.  Respiratory: Negative for cough and shortness of breath.   Cardiovascular: Negative for chest pain or palpitations.  Gastrointestinal: Negative for abdominal pain, no bowel changes.  Musculoskeletal: Negative for gait problem or joint swelling.  Skin: Negative for rash.  Neurological: Negative for dizziness or headache.  No other specific complaints in a complete review of systems (except as listed in HPI above).      Objective:    BP 124/80   Pulse 88   Temp 98.2 F (36.8 C) (Oral)   Resp 18   Ht 5\' 11"  (1.803 m)   Wt (!) 307 lb 11.2 oz (139.6 kg)   LMP 01/01/2021   SpO2 97%   BMI 42.92 kg/m   Wt Readings from Last 3 Encounters:  01/01/21 (!) 307 lb 11.2 oz (139.6 kg)  02/21/20 269 lb 8 oz (122.2 kg)  01/23/20 271 lb 14.4 oz (123.3 kg)    Physical Exam  Constitutional: Patient appears well-developed and well-nourished. Obese No distress.  HEENT: head atraumatic, normocephalic, pupils equal and reactive  to light, neck supple Cardiovascular: Normal rate, regular rhythm and normal heart sounds.  No murmur heard. No BLE edema. Pulmonary/Chest: Effort normal and breath sounds normal. No respiratory distress. Abdominal: Soft.  There is no tenderness. Psychiatric: Patient has a normal mood and affect. behavior is normal. Judgment and thought content normal.   Results for orders placed or performed in visit on 08/02/19  NuSwab Vaginitis Plus (VG+)  Result Value Ref Range   Atopobium vaginae High - 2 (A) Score   BVAB 2 High - 2 (A) Score   Megasphaera 1 High - 2 (A) Score   Candida albicans, NAA Negative Negative   Candida glabrata, NAA Negative Negative   Trich vag by NAA Negative Negative   Chlamydia trachomatis, NAA Negative Negative   Neisseria gonorrhoeae, NAA Negative Negative  Surgical pathology  Result Value Ref Range   SURGICAL PATHOLOGY      SURGICAL PATHOLOGY CASE: [id number] PATIENT: Diane Williamson Surgical Pathology Report     Clinical History: LGSIL (cm)     FINAL MICROSCOPIC DIAGNOSIS:  A. CERVIX, 12 O'CLOCK, BIOPSY: - Low-grade squamous intraepithelial lesion, CIN-1.  B. ENDOCERVIX, CURETTAGE: - Benign endocervical mucosa.  GROSS DESCRIPTION: A.  Received in formalin is a tan, soft tissue fragment that is submitted in toto.  Size: 0.6 cm, 1 block submitted. B.  Received in formalin is blood tinged mucus that is entirely submitted in one block.  Volume: 0.8 x 0.5 x 0.1 cm (1 B) (AK 08/05/2019)   Final Diagnosis performed by 08/07/2019, MD.   Electronically signed 08/06/2019 Technical component performed at [facility]. [facility], [address], [place], [place].  Professional component performed at [facility], [address] [place], [place].  Immunohistochemistry Technical component (if applicable) was performed at [facility]. [address], [address], [place], [place].    IMMUNOHISTOCHEMISTRY DISCLAIMER (if applicable): Some of these immunohistochemical stains may have been developed and the performance characteristics determine by [facility]. Some may not have been cleared or approved by the U.S. Food and Drug Administration. The FDA has determined that such clearance or approval is not necessary. This test is used for clinical purposes. It should not be regarded as investigational or for research. This laboratory is certified under the Clinical Laboratory Improvement Amendments of 1988 (CLIA-88) as qualified to perform high complexity clinical laboratory testing.  The controls stained appropriately.       Assessment & Plan:   1. Generalized anxiety disorder  - sertraline (ZOLOFT) 100 MG tablet; Take 2 tablets (200 mg total) by mouth daily.  Dispense: 180 tablet; Refill: 3  2. Depression with anxiety  - sertraline (ZOLOFT) 100 MG tablet; Take 2 tablets (200 mg total) by mouth daily.  Dispense: 180 tablet; Refill: 3  3. Psychophysiological insomnia  - sertraline (ZOLOFT) 100 MG tablet; Take 2 tablets (200 mg total) by mouth daily.  Dispense: 180 tablet; Refill: 3  4. Obesity  - Discussed keeping a food journal, and cutting out sugary drinks.  - Liraglutide -Weight Management (SAXENDA) 18 MG/3ML SOPN; Inject 0.6-3 mg into the skin as directed.  Dispense: 15 mL; Refill: 4 - Insulin Pen Needle (NOVOFINE PLUS PEN NEEDLE) 32G X 4 MM MISC; 1 each by Does not apply route daily.  Dispense: 30 each; Refill: 3  5. Intermittent asthma without complication, unspecified asthma severity  - VENTOLIN HFA 108 (90 Base) MCG/ACT inhaler; INHALE 2 PUFFS BY MOUTH EVERY 6 HOURS AS NEEDED FOR WHEEZE, SHORTNESS OF BREATH, CHEST TIGHTNESS, OR COUGHING FITS  Dispense: 18 g; Refill: 3  6. Family planning services  - etonogestrel-ethinyl estradiol (NUVARING) 0.12-0.015 MG/24HR vaginal ring; Insert vaginally and leave in place for 3 consecutive weeks, then  remove for 1 week.  Dispense: 3 each; Refill: 3   Follow up plan: Return for 2 month for F/u, CPE 1 month.

## 2021-01-19 ENCOUNTER — Other Ambulatory Visit: Payer: Self-pay | Admitting: Nurse Practitioner

## 2021-01-19 DIAGNOSIS — J4520 Mild intermittent asthma, uncomplicated: Secondary | ICD-10-CM

## 2021-01-20 NOTE — Telephone Encounter (Signed)
Requested medication (s) are due for refill today - no  Requested medication (s) are on the active medication list -yes  Future visit scheduled -yes  Last refill: 01/01/21 18g 3RF  Notes to clinic: Request changes to original Rx- request generic  Requested Prescriptions  Pending Prescriptions Disp Refills   VENTOLIN HFA 108 (90 Base) MCG/ACT inhaler [Pharmacy Med Name: VENTOLIN HFA        AER] 18 g 3    Sig: INHALE 2 PUFFS BY MOUTH EVERY 6 HOURS AS NEEDED FOR WHEEZE, SHORTNESS OF BREATH, CHEST TIGHTNESS, OR COUGHING FITS     Pulmonology:  Beta Agonists Failed - 01/19/2021  5:46 PM      Failed - One inhaler should last at least one month. If the patient is requesting refills earlier, contact the patient to check for uncontrolled symptoms.      Passed - Valid encounter within last 12 months    Recent Outpatient Visits           2 weeks ago Generalized anxiety disorder   [facility] Berniece Salines, FNP   11 months ago Generalized anxiety disorder   [facility] Danelle Berry, PA-C   12 months ago Depression with anxiety   [facility] Danelle Berry, PA-C   1 year ago Generalized anxiety disorder   [facility] Welford Roche D, MD   2 years ago Gastroesophageal reflux disease without esophagitis   [facility] Danelle Berry, PA-C       Future Appointments             In 1 week Zane Herald, Rudolpho Sevin, FNP [facility], [facility]               Requested Prescriptions  Pending Prescriptions Disp Refills   VENTOLIN HFA 108 (90 Base) MCG/ACT inhaler [Pharmacy Med Name: VENTOLIN HFA        AER] 18 g 3    Sig: INHALE 2 PUFFS BY MOUTH EVERY 6 HOURS AS NEEDED FOR WHEEZE, SHORTNESS OF BREATH, CHEST TIGHTNESS, OR COUGHING FITS     Pulmonology:  Beta Agonists Failed - 01/19/2021  5:46 PM      Failed - One inhaler should last at least one month. If the patient is requesting  refills earlier, contact the patient to check for uncontrolled symptoms.      Passed - Valid encounter within last 12 months    Recent Outpatient Visits           2 weeks ago Generalized anxiety disorder   [facility] Berniece Salines, FNP   11 months ago Generalized anxiety disorder   [facility] Danelle Berry, PA-C   12 months ago Depression with anxiety   [facility] Danelle Berry, PA-C   1 year ago Generalized anxiety disorder   [facility] Welford Roche D, MD   2 years ago Gastroesophageal reflux disease without esophagitis   [facility] Danelle Berry, PA-C       Future Appointments             In 1 week Zane Herald, Rudolpho Sevin, FNP [facility], [facility]

## 2021-02-01 ENCOUNTER — Encounter: Payer: Self-pay | Admitting: Nurse Practitioner

## 2021-02-01 ENCOUNTER — Other Ambulatory Visit: Payer: Self-pay

## 2021-02-01 ENCOUNTER — Ambulatory Visit (INDEPENDENT_AMBULATORY_CARE_PROVIDER_SITE_OTHER): Payer: BC Managed Care – PPO | Admitting: Nurse Practitioner

## 2021-02-01 VITALS — BP 120/80 | HR 83 | Temp 97.9°F | Resp 18 | Ht 71.0 in | Wt 315.1 lb

## 2021-02-01 DIAGNOSIS — Z0000 Encounter for general adult medical examination without abnormal findings: Secondary | ICD-10-CM | POA: Diagnosis not present

## 2021-02-01 DIAGNOSIS — Z8349 Family history of other endocrine, nutritional and metabolic diseases: Secondary | ICD-10-CM

## 2021-02-01 DIAGNOSIS — Z131 Encounter for screening for diabetes mellitus: Secondary | ICD-10-CM | POA: Diagnosis not present

## 2021-02-01 DIAGNOSIS — Z13220 Encounter for screening for lipoid disorders: Secondary | ICD-10-CM

## 2021-02-01 DIAGNOSIS — Z130 Encounter for screening for diseases of the blood and blood-forming organs and certain disorders involving the immune mechanism: Secondary | ICD-10-CM

## 2021-02-01 NOTE — Patient Instructions (Signed)
Preventive Care 21-30 Years Old, Female Preventive care refers to lifestyle choices and visits with your health care provider that can promote health and wellness. This includes: A yearly physical exam. This is also called an annual wellness visit. Regular dental and eye exams. Immunizations. Screening for certain conditions. Healthy lifestyle choices, such as: Eating a healthy diet. Getting regular exercise. Not using drugs or products that contain nicotine and tobacco. Limiting alcohol use. What can I expect for my preventive care visit? Physical exam Your health care provider may check your: Height and weight. These may be used to calculate your BMI (body mass index). BMI is a measurement that tells if you are at a healthy weight. Heart rate and blood pressure. Body temperature. Skin for abnormal spots. Counseling Your health care provider may ask you questions about your: Past medical problems. Family's medical history. Alcohol, tobacco, and drug use. Emotional well-being. Home life and relationship well-being. Sexual activity. Diet, exercise, and sleep habits. Work and work environment. Access to firearms. Method of birth control. Menstrual cycle. Pregnancy history. What immunizations do I need? Vaccines are usually given at various ages, according to a schedule. Your health care provider will recommend vaccines for you based on your age, medical history, and lifestyle or other factors, such as travel or where you work. What tests do I need? Blood tests Lipid and cholesterol levels. These may be checked every 5 years starting at age 20. Hepatitis C test. Hepatitis B test. Screening Diabetes screening. This is done by checking your blood sugar (glucose) after you have not eaten for a while (fasting). STD (sexually transmitted disease) testing, if you are at risk. BRCA-related cancer screening. This may be done if you have a family history of breast, ovarian, tubal, or  peritoneal cancers. Pelvic exam and Pap test. This may be done every 3 years starting at age 21. Starting at age 30, this may be done every 5 years if you have a Pap test in combination with an HPV test. Talk with your health care provider about your test results, treatment options, and if necessary, the need for more tests. Follow these instructions at home: Eating and drinking  Eat a healthy diet that includes fresh fruits and vegetables, whole grains, lean protein, and low-fat dairy products. Take vitamin and mineral supplements as recommended by your health care provider. Do not drink alcohol if: Your health care provider tells you not to drink. You are pregnant, may be pregnant, or are planning to become pregnant. If you drink alcohol: Limit how much you have to 0-1 drink a day. Be aware of how much alcohol is in your drink. In the [place] one drink equals one 12 oz bottle of beer (355 mL), one 5 oz glass of wine (148 mL), or one 1 oz glass of hard liquor (44 mL). Lifestyle Take daily care of your teeth and gums. Brush your teeth every morning and night with fluoride toothpaste. Floss one time each day. Stay active. Exercise for at least 30 minutes 5 or more days each week. Do not use any products that contain nicotine or tobacco, such as cigarettes, e-cigarettes, and chewing tobacco. If you need help quitting, ask your health care provider. Do not use drugs. If you are sexually active, practice safe sex. Use a condom or other form of protection to prevent STIs (sexually transmitted infections). If you do not wish to become pregnant, use a form of birth control. If you plan to become pregnant, see your health care provider   for a prepregnancy visit. Find healthy ways to cope with stress, such as: Meditation, yoga, or listening to music. Journaling. Talking to a trusted person. Spending time with friends and family. Safety Always wear your seat belt while driving or riding in a  vehicle. Do not drive: If you have been drinking alcohol. Do not ride with someone who has been drinking. When you are tired or distracted. While texting. Wear a helmet and other protective equipment during sports activities. If you have firearms in your house, make sure you follow all gun safety procedures. Seek help if you have been physically or sexually abused. What's next? Go to your health care provider once a year for an annual wellness visit. Ask your health care provider how often you should have your eyes and teeth checked. Stay up to date on all vaccines. This information is not intended to replace advice given to you by your health care provider. Make sure you discuss any questions you have with your health care provider. Document Revised: 06/12/2020 Document Reviewed: 12/14/2017 Elsevier Patient Education  2022 [employer] Inc.  

## 2021-02-01 NOTE — Progress Notes (Signed)
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

## 2021-02-01 NOTE — Addendum Note (Signed)
Addended by: Della Goo F on: 02/01/2021 09:07 AM   Modules accepted: Orders

## 2021-02-01 NOTE — Progress Notes (Signed)
Name: Diane Williamson   MRN: [id number]    DOB: 1990-07-16   Date:02/01/2021       Progress Note  Subjective  Chief Complaint  physical   HPI  Patient presents for annual CPE.  Diet: Fast food, mainly eats out, cheese, lean meats Exercise: walks at work, 2- 4 times a week, discussed recommendations of 150 min a week  [place] Visit from 02/01/2021 in [facility]  AUDIT-C Score 0      Depression: Phq 9 is  positive Depression screen [facility] 2/9 02/01/2021 01/01/2021 02/21/2020 01/23/2020 12/30/2019  Decreased Interest 0 0 0 0 1  Down, Depressed, Hopeless 0 0 0 0 0  PHQ - 2 Score 0 0 0 0 1  Altered sleeping '1 2 1 3 ' -  Tired, decreased energy 1 0 2 2 -  Change in appetite '3 2 2 3 ' -  Feeling bad or failure about yourself  0 0 0 0 -  Trouble concentrating 0 0 0 2 -  Moving slowly or fidgety/restless 1 0 0 3 -  Suicidal thoughts 0 0 0 0 -  PHQ-9 Score '6 4 5 13 ' -  Difficult doing work/chores Somewhat difficult Not difficult at all Not difficult at all Somewhat difficult -   Hypertension: BP Readings from Last 3 Encounters:  02/01/21 120/80  01/01/21 124/80  02/21/20 120/78   Obesity: Wt Readings from Last 3 Encounters:  02/01/21 (!) 315 lb 1.6 oz (142.9 kg)  01/01/21 (!) 307 lb 11.2 oz (139.6 kg)  02/21/20 269 lb 8 oz (122.2 kg)   BMI Readings from Last 3 Encounters:  02/01/21 43.95 kg/m  01/01/21 42.92 kg/m  02/21/20 37.59 kg/m     Vaccines:  HPV: up to at age 51 , ask insurance if age between 38-45  Shingrix: 12-64 yo and ask insurance if covered when patient above 68 yo Pneumonia: educated and discussed with patient. Flu:  educated and discussed with patient, not interested  Hep C Screening: 05/2017 STD testing and prevention (HIV/chl/gon/syphilis): 12/2018 Intimate partner violence:no issues Sexual History : yes, one partner Menstrual History/LMP/Abnormal Bleeding: [facility]: started 3 days ago, normal cycle Incontinence Symptoms:  none  Breast cancer:  - Last Mammogram: none - BRCA gene screening: not done, no family history  Osteoporosis: Discussed high calcium and vitamin D supplementation, weight bearing exercises  Cervical cancer screening: 06/2019 was abnormal was seen by GYN,  was told to come back at three years for next pap.  She will follow-up with GYN for this.   Skin cancer: Discussed monitoring for atypical lesions  Colorectal cancer: does not qualify Lung cancer:  Low Dose CT Chest recommended if Age 60-80 years, 20 pack-year currently smoking OR have quit w/in 15years. Patient does not qualify.   ECG: none  Advanced Care Planning: A voluntary discussion about advance care planning including the explanation and discussion of advance directives.  Discussed health care proxy and Living will, and the patient was able to identify a health care proxy as sister, Nelda Severe.  Lipids: Lab Results  Component Value Date   CHOL 162 06/07/2018   Lab Results  Component Value Date   HDL 48 (L) 06/07/2018   Lab Results  Component Value Date   LDLCALC 91 06/07/2018   Lab Results  Component Value Date   TRIG 135 06/07/2018   Lab Results  Component Value Date   CHOLHDL 3.4 06/07/2018   No results found for: LDLDIRECT  Glucose: Glucose  Date Value  Ref Range Status  08/05/2013 90 65 - 99 mg/dL Final   Glucose, Bld  Date Value Ref Range Status  12/31/2018 96 65 - 99 mg/dL Final    Comment:    .            Fasting reference interval .   06/07/2018 85 65 - 99 mg/dL Final    Comment:    .            Fasting reference interval .   09/26/2017 106 (H) 65 - 99 mg/dL Final    Patient Active Problem List   Diagnosis Date Noted   Generalized anxiety disorder 12/30/2019   Depression with anxiety 12/30/2019   Chronic bilateral low back pain without sciatica 12/30/2019   Psychophysiological insomnia 12/30/2019   Carpal tunnel syndrome of right wrist 11/16/2019   Dysplasia of cervix, low grade  (CIN 1) 07/04/2019   Chronic venous insufficiency 09/03/2018   Eczema 07/03/2018   Allergic rhinitis 07/03/2018   GERD (gastroesophageal reflux disease) 07/03/2018   Vitamin D deficiency 07/03/2018   Obesity (BMI 35.0-39.9 without comorbidity) 06/07/2018   Uncontrolled persistent asthma 06/07/2018   Anemia 06/07/2018   Hematuria, microscopic 06/07/2018   ADHD (attention deficit hyperactivity disorder), 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/2018   Varicose veins of both lower extremities with pain 06/07/2018   Adjustment disorder with mixed anxiety and depressed mood 10/10/2017   Personal history of nonsuicidal self-harm 08/29/2017   Vitiligo 12/08/2010    Past Surgical History:  Procedure Laterality Date   NO PAST SURGERIES      Family History  Problem Relation Age of Onset   Heart disease Mother    Hypertension Mother    ADD / ADHD Father    Hypertension Father    Cancer Maternal Grandmother    Heart disease Maternal Grandmother    Diabetes Maternal Grandmother    Ovarian cancer Maternal Grandmother    Diabetes Maternal Grandfather    Diabetes Paternal Grandmother    Atrial fibrillation Paternal Grandmother    AAA (abdominal aortic aneurysm) Paternal Grandmother    Heart disease Paternal Grandmother    Heart attack Paternal Grandfather    Varicose Veins Paternal Grandfather    Breast cancer Neg Hx     Social History   Socioeconomic History   Marital status: Divorced    Spouse name: Not on file   Number of children: Not on file   Years of education: 12   Highest education level: Some college, no degree  Occupational History   Not on file  Tobacco Use   Smoking status: Former    Packs/day: 0.50    Years: 7.00    Pack years: 3.50    Types: Cigarettes    Quit date: 01/17/2016    Years since quitting: 5.0   Smokeless tobacco: Never  Vaping Use   Vaping Use: Never used  Substance and Sexual Activity   Alcohol use: Yes    Alcohol/week: 2.0 standard drinks    Types: 2  Glasses of wine per week   Drug use: No   Sexual activity: Yes    Partners: Male    Birth control/protection: Inserts  Other Topics Concern   Not on file  Social History Narrative   Not on file   Social Determinants of Health   Financial Resource Strain: Low Risk    Difficulty of Paying Living Expenses: Not hard at all  Food Insecurity: No Food Insecurity   Worried About [place] in the Last Year:  Never true   Ran Out of Food in the Last Year: Never true  Transportation Needs: No Transportation Needs   Lack of Transportation (Medical): No   Lack of Transportation (Non-Medical): No  Physical Activity: Insufficiently Active   Days of Exercise per Week: 2 days   Minutes of Exercise per Session: 20 min  Stress: Stress Concern Present   Feeling of Stress : To some extent  Social Connections: Moderately Integrated   Frequency of Communication with Friends and Family: More than three times a week   Frequency of Social Gatherings with Friends and Family: Twice a week   Attends Religious Services: More than 4 times per year   Active Member of [employer] or Organizations: Yes   Attends Music therapist: More than 4 times per year   Marital Status: Divorced  Human resources officer Violence: Not At Risk   Fear of Current or Ex-Partner: No   Emotionally Abused: No   Physically Abused: No   Sexually Abused: No     Current Outpatient Medications:    acyclovir (ZOVIRAX) 800 MG tablet, TAKE 1 TABLET BY MOUTH THREE TIMES DAILY FOR 2 DAYS WITH  OUTBREAKS,  ONE  DAILY  FOR  SUPPRESSION, Disp: 38 tablet, Rfl: 0   etonogestrel-ethinyl estradiol (NUVARING) 0.12-0.015 MG/24HR vaginal ring, Insert vaginally and leave in place for 3 consecutive weeks, then remove for 1 week., Disp: 3 each, Rfl: 3   ipratropium-albuterol (DUONEB) 0.5-2.5 (3) MG/3ML SOLN, Take 3 mLs by nebulization every 4 (four) hours as needed., Disp: 360 mL, Rfl: 0   Liraglutide -Weight Management (SAXENDA) 18 MG/3ML  SOPN, Inject 0.6-3 mg into the skin as directed., Disp: 15 mL, Rfl: 4   sertraline (ZOLOFT) 100 MG tablet, Take 2 tablets (200 mg total) by mouth daily., Disp: 180 tablet, Rfl: 3   VENTOLIN HFA 108 (90 Base) MCG/ACT inhaler, INHALE 2 PUFFS BY MOUTH EVERY 6 HOURS AS NEEDED FOR WHEEZE, SHORTNESS OF BREATH, CHEST TIGHTNESS, OR COUGHING FITS, Disp: 18 g, Rfl: 3   Insulin Pen Needle (NOVOFINE PLUS PEN NEEDLE) 32G X 4 MM MISC, 1 each by Does not apply route daily. (Patient not taking: Reported on 02/01/2021), Disp: 30 each, Rfl: 0  Allergies  Allergen Reactions   Benadryl Allergy [Diphenhydramine Hcl]     Rapid heart rate.     ROS  Constitutional: Negative for fever, positive for weight change.  Respiratory: Negative for cough and shortness of breath.   Cardiovascular: Negative for chest pain or palpitations.  Gastrointestinal: Negative for abdominal pain, no bowel changes.  Musculoskeletal: Negative for gait problem or joint swelling. Positive for Right heel pain Skin: Negative for rash.  Neurological: Negative for dizziness or headache.  No other specific complaints in a complete review of systems (except as listed in HPI above).   Objective  Vitals:   02/01/21 0825  BP: 120/80  Pulse: 83  Resp: 18  Temp: 97.9 F (36.6 C)  TempSrc: Oral  SpO2: 99%  Weight: (!) 315 lb 1.6 oz (142.9 kg)  Height: '5\' 11"'  (1.803 m)    Body mass index is 43.95 kg/m.  Physical Exam Constitutional: Patient appears well-developed and well-nourished. No distress.  HENT: Head: Normocephalic and atraumatic. Ears: B TMs ok, no erythema or effusion; Nose: Nose normal. Mouth/Throat: not done Eyes: Conjunctivae and EOM are normal. Pupils are equal, round, and reactive to light. No scleral icterus.  Neck: Normal range of motion. Neck supple. No JVD present. No thyromegaly present.  Cardiovascular: Normal rate, regular  rhythm and normal heart sounds.  No murmur heard. No BLE edema. Pulmonary/Chest: Effort  normal and breath sounds normal. No respiratory distress. Abdominal: Soft. Bowel sounds are normal, no distension. There is no tenderness. no masses Breast: no lumps or masses, no nipple discharge or rashes FEMALE GENITALIA: not done RECTAL: not done Musculoskeletal: Normal range of motion, no joint effusions. No gross deformities Neurological: he is alert and oriented to person, place, and time. No cranial nerve deficit. Coordination, balance, strength, speech and gait are normal.  Skin: Skin is warm and dry. No rash noted. No erythema.  Psychiatric: Patient has a normal mood and affect. behavior is normal. Judgment and thought content normal.   No results found for this or any previous visit (from the past 2160 hour(s)).   Fall Risk: Fall Risk  02/01/2021 01/01/2021 02/21/2020 01/23/2020 12/30/2019  Falls in the past year? 0 0 0 0 0  Number falls in past yr: 0 0 0 0 0  Injury with Fall? 0 0 0 0 0  Risk for fall due to : - - - Mental status change -  Follow up Falls evaluation completed Falls evaluation completed Falls evaluation completed Falls evaluation completed Falls evaluation completed    Functional Status Survey: Is the patient deaf or have difficulty hearing?: No Does the patient have difficulty seeing, even when wearing glasses/contacts?: No Does the patient have difficulty concentrating, remembering, or making decisions?: No Does the patient have difficulty walking or climbing stairs?: No Does the patient have difficulty dressing or bathing?: No Does the patient have difficulty doing errands alone such as visiting a doctor's office or shopping?: No   Assessment & Plan  1. Physical exam, annual  - CBC with Differential/Platelet - COMPLETE METABOLIC PANEL WITH GFR - Lipid panel - Hemoglobin A1c - TSH  2. Screening for cholesterol level  - Lipid panel  3. Screening for diabetes mellitus  - COMPLETE METABOLIC PANEL WITH GFR - Hemoglobin A1c  4. Screening for  deficiency anemia  - CBC with Differential/Platelet  5. Family history of thyroid disorder  - TSH   -USPSTF grade A and B recommendations reviewed with patient; age-appropriate recommendations, preventive care, screening tests, etc discussed and encouraged; healthy living encouraged; see AVS for patient education given to patient -Discussed importance of 150 minutes of physical activity weekly, eat two servings of fish weekly, eat one serving of tree nuts ( cashews, pistachios, pecans, almonds.Marland Kitchen) every other day, eat 6 servings of fruit/vegetables daily and drink plenty of water and avoid sweet beverages.

## 2021-02-02 LAB — CBC WITH DIFFERENTIAL/PLATELET
Absolute Monocytes: 507 cells/uL (ref 200–950)
Basophils Absolute: 27 cells/uL (ref 0–200)
Basophils Relative: 0.3 %
Eosinophils Absolute: 320 cells/uL (ref 15–500)
Eosinophils Relative: 3.6 %
HCT: 33.4 % — ABNORMAL LOW (ref 35.0–45.0)
Hemoglobin: 10.4 g/dL — ABNORMAL LOW (ref 11.7–15.5)
Lymphs Abs: 3355 cells/uL (ref 850–3900)
MCH: 25.3 pg — ABNORMAL LOW (ref 27.0–33.0)
MCHC: 31.1 g/dL — ABNORMAL LOW (ref 32.0–36.0)
MCV: 81.3 fL (ref 80.0–100.0)
MPV: 9.4 fL (ref 7.5–12.5)
Monocytes Relative: 5.7 %
Neutro Abs: 4690 cells/uL (ref 1500–7800)
Neutrophils Relative %: 52.7 %
Platelets: 373 10*3/uL (ref 140–400)
RBC: 4.11 10*6/uL (ref 3.80–5.10)
RDW: 14.6 % (ref 11.0–15.0)
Total Lymphocyte: 37.7 %
WBC: 8.9 10*3/uL (ref 3.8–10.8)

## 2021-02-02 LAB — COMPLETE METABOLIC PANEL WITH GFR
AG Ratio: 1.6 (calc) (ref 1.0–2.5)
ALT: 9 U/L (ref 6–29)
AST: 12 U/L (ref 10–30)
Albumin: 4.2 g/dL (ref 3.6–5.1)
Alkaline phosphatase (APISO): 64 U/L (ref 31–125)
BUN: 9 mg/dL (ref 7–25)
CO2: 26 mmol/L (ref 20–32)
Calcium: 9.2 mg/dL (ref 8.6–10.2)
Chloride: 104 mmol/L (ref 98–110)
Creat: 0.67 mg/dL (ref 0.50–0.97)
Globulin: 2.7 g/dL (calc) (ref 1.9–3.7)
Glucose, Bld: 88 mg/dL (ref 65–99)
Potassium: 3.4 mmol/L — ABNORMAL LOW (ref 3.5–5.3)
Sodium: 140 mmol/L (ref 135–146)
Total Bilirubin: 0.2 mg/dL (ref 0.2–1.2)
Total Protein: 6.9 g/dL (ref 6.1–8.1)
eGFR: 121 mL/min/{1.73_m2} (ref >=60)

## 2021-02-02 LAB — HEMOGLOBIN A1C
Hgb A1c MFr Bld: 5.4 % of total Hgb (ref <5.7)
Mean Plasma Glucose: 108 mg/dL
eAG (mmol/L): 6 mmol/L

## 2021-02-02 LAB — LIPID PANEL
Cholesterol: 202 mg/dL — ABNORMAL HIGH (ref <200)
HDL: 48 mg/dL — ABNORMAL LOW (ref >=50)
LDL Cholesterol (Calc): 122 mg/dL (calc) — ABNORMAL HIGH
Non-HDL Cholesterol (Calc): 154 mg/dL (calc) — ABNORMAL HIGH (ref <130)
Total CHOL/HDL Ratio: 4.2 (calc) (ref <5.0)
Triglycerides: 204 mg/dL — ABNORMAL HIGH (ref <150)

## 2021-02-02 LAB — TSH: TSH: 2.55 mIU/L

## 2021-03-04 ENCOUNTER — Ambulatory Visit: Payer: BC Managed Care – PPO | Admitting: Unknown Physician Specialty

## 2021-03-08 ENCOUNTER — Encounter: Payer: Self-pay | Admitting: Nurse Practitioner

## 2021-03-08 ENCOUNTER — Other Ambulatory Visit: Payer: Self-pay

## 2021-03-08 ENCOUNTER — Ambulatory Visit (INDEPENDENT_AMBULATORY_CARE_PROVIDER_SITE_OTHER): Payer: BC Managed Care – PPO | Admitting: Nurse Practitioner

## 2021-03-08 VITALS — BP 124/76 | HR 98 | Temp 98.4°F | Resp 18 | Ht 71.0 in | Wt 310.0 lb

## 2021-03-08 DIAGNOSIS — K219 Gastro-esophageal reflux disease without esophagitis: Secondary | ICD-10-CM | POA: Diagnosis not present

## 2021-03-08 DIAGNOSIS — F411 Generalized anxiety disorder: Secondary | ICD-10-CM | POA: Diagnosis not present

## 2021-03-08 DIAGNOSIS — F418 Other specified anxiety disorders: Secondary | ICD-10-CM | POA: Diagnosis not present

## 2021-03-08 DIAGNOSIS — Z3009 Encounter for other general counseling and advice on contraception: Secondary | ICD-10-CM

## 2021-03-08 DIAGNOSIS — J4520 Mild intermittent asthma, uncomplicated: Secondary | ICD-10-CM

## 2021-03-08 DIAGNOSIS — E66813 Obesity, class 3: Secondary | ICD-10-CM

## 2021-03-08 MED ORDER — ETONOGESTREL-ETHINYL ESTRADIOL 0.12-0.015 MG/24HR VA RING: VAGINAL_RING | VAGINAL | 3 refills | Status: DC

## 2021-03-08 MED ORDER — VENTOLIN HFA 108 (90 BASE) MCG/ACT IN AERS: 1.0000 | INHALATION_SPRAY | RESPIRATORY_TRACT | 3 refills | Status: DC | PRN

## 2021-03-08 NOTE — Progress Notes (Signed)
BP 124/76   Pulse 98   Temp 98.4 F (36.9 C) (Oral)   Resp 18   Ht _0  (1.803 m)   Wt (!) 310 lb (140.6 kg)   SpO2 99%   BMI 43.24 kg/m    Subjective:    Patient ID: Diane Williamson, female    DOB: [birth year], 30 y.o.   MRN: [id number]  HPI: Diane Williamson is a 30 y.o. female  Chief Complaint  Patient presents with   Follow-up   Obesity:  She is interested in weight loss surgery.  She has tried many different ways to lose weight.  Attempted to get her approved for saxenda and was declined by insurance.  She is currently 310 lbs, BMI 43.24.  Referral placed.  GAD/depression:  She says that her anxiety and depression have been well controled.  She is currently taking Zoloft 200 mg daily. Her GAD score is 1 and her PHQ9 score is 0.  She says that she is handling her stress much better now.   GAD 7 : Generalized Anxiety Score 02/01/2021 01/01/2021 02/21/2020 01/23/2020  Nervous, Anxious, on Edge 0 0 1 3  Control/stop worrying 0 0 0 3  Worry too much - different things 0 0 0 3  Trouble relaxing 0 0 2 3  Restless 1 0 1 3  Easily annoyed or irritable 0 0 2 2  Afraid - awful might happen 0 0 0 0  Total GAD 7 Score 1 0 6 17  Anxiety Difficulty Not difficult at all Not difficult at all - Somewhat difficult   Depression screen [facility] 2/9 03/08/2021 02/01/2021 01/01/2021 02/21/2020 01/23/2020  Decreased Interest 0 0 0 0 0  Down, Depressed, Hopeless 0 0 0 0 0  PHQ - 2 Score 0 0 0 0 0  Altered sleeping 0 _1 Tired, decreased energy 0 1 0 2 2  Change in appetite 0 _2 Feeling bad or failure about yourself  0 0 0 0 0  Trouble concentrating 0 0 0 0 2  Moving slowly or fidgety/restless 0 1 0 0 3  Suicidal thoughts 0 0 0 0 0  PHQ-9 Score 0 _3 Difficult doing work/chores Not difficult at all Somewhat difficult Not difficult at all Not difficult at all Somewhat difficult    GERD:  She says her acid reflux has been fine. She is not currently taking any medication.  Asthma:   She says her asthma has not been giving her any problems lately.  She says she uses her inhaler maybe once a week.    Family and social history reviewed and updated as indicated. Interim medical history since our last visit reviewed. Allergies and medications reviewed and updated.  Review of Systems  Constitutional: Negative for fever, positive for weight change.  Respiratory: Negative for cough and shortness of breath.   Cardiovascular: Negative for chest pain or palpitations.  Gastrointestinal: Negative for abdominal pain, no bowel changes.  Musculoskeletal: Negative for gait problem or joint swelling.  Skin: Negative for rash.  Neurological: Negative for dizziness or headache.  No other specific complaints in a complete review of systems (except as listed in HPI above).      Objective:    BP 124/76   Pulse 98   Temp 98.4 F (36.9 C) (Oral)   Resp 18   Ht _4  (1.803 m)   Wt (!) 310 lb (140.6 kg)   SpO2 99%  BMI 43.24 kg/m   Wt Readings from Last 3 Encounters:  03/08/21 (!) 310 lb (140.6 kg)  02/01/21 (!) 315 lb 1.6 oz (142.9 kg)  01/01/21 (!) 307 lb 11.2 oz (139.6 kg)    Physical Exam  Constitutional: Patient appears well-developed and well-nourished. Obese  No distress.  HEENT: head atraumatic, normocephalic, pupils equal and reactive to light, neck supple Cardiovascular: Normal rate, regular rhythm and normal heart sounds.  No murmur heard. No BLE edema. Pulmonary/Chest: Effort normal and breath sounds normal. No respiratory distress. Abdominal: Soft.  There is no tenderness. Psychiatric: Patient has a normal mood and affect. behavior is normal. Judgment and thought content normal.   Results for orders placed or performed in visit on 02/01/21  CBC with Differential/Platelet  Result Value Ref Range   WBC 8.9 3.8 - 10.8 Thousand/uL   RBC 4.11 3.80 - 5.10 Million/uL   Hemoglobin 10.4 (L) 11.7 - 15.5 g/dL   HCT 33.4 (L) 35.0 - 45.0 %   MCV 81.3 80.0 - 100.0 fL    MCH 25.3 (L) 27.0 - 33.0 pg   MCHC 31.1 (L) 32.0 - 36.0 g/dL   RDW 14.6 11.0 - 15.0 %   Platelets 373 140 - 400 Thousand/uL   MPV 9.4 7.5 - 12.5 fL   Neutro Abs 4,690 1,500 - 7,800 cells/uL   Lymphs Abs 3,355 850 - 3,900 cells/uL   Absolute Monocytes 507 200 - 950 cells/uL   Eosinophils Absolute 320 15 - 500 cells/uL   Basophils Absolute 27 0 - 200 cells/uL   Neutrophils Relative % 52.7 %   Total Lymphocyte 37.7 %   Monocytes Relative 5.7 %   Eosinophils Relative 3.6 %   Basophils Relative 0.3 %  COMPLETE METABOLIC PANEL WITH GFR  Result Value Ref Range   Glucose, Bld 88 65 - 99 mg/dL   BUN 9 7 - 25 mg/dL   Creat 0.67 0.50 - 0.97 mg/dL   eGFR 121 > OR = 60 mL/min/1.53m   BUN/Creatinine Ratio NOT APPLICABLE 6 - 22 (calc)   Sodium 140 135 - 146 mmol/L   Potassium 3.4 (L) 3.5 - 5.3 mmol/L   Chloride 104 98 - 110 mmol/L   CO2 26 20 - 32 mmol/L   Calcium 9.2 8.6 - 10.2 mg/dL   Total Protein 6.9 6.1 - 8.1 g/dL   Albumin 4.2 3.6 - 5.1 g/dL   Globulin 2.7 1.9 - 3.7 g/dL (calc)   AG Ratio 1.6 1.0 - 2.5 (calc)   Total Bilirubin 0.2 0.2 - 1.2 mg/dL   Alkaline phosphatase (APISO) 64 31 - 125 U/L   AST 12 10 - 30 U/L   ALT 9 6 - 29 U/L  Lipid panel  Result Value Ref Range   Cholesterol 202 (H) <200 mg/dL   HDL 48 (L) > OR = 50 mg/dL   Triglycerides 204 (H) <150 mg/dL   LDL Cholesterol (Calc) 122 (H) mg/dL (calc)   Total CHOL/HDL Ratio 4.2 <5.0 (calc)   Non-HDL Cholesterol (Calc) 154 (H) <130 mg/dL (calc)  Hemoglobin A1c  Result Value Ref Range   Hgb A1c MFr Bld 5.4 <5.7 % of total Hgb   Mean Plasma Glucose 108 mg/dL   eAG (mmol/L) 6.0 mmol/L  TSH  Result Value Ref Range   TSH 2.55 mIU/L      Assessment & Plan:   1. Obesity, Class III, BMI 40-49.9 (morbid obesity) (HCC)  - Amb Referral to Bariatric Surgery  2. Generalized anxiety disorder -continue current treatment  plan  3. Depression with anxiety -continue current treatment plan  4. Gastroesophageal reflux  disease, unspecified whether esophagitis present -continue controlling with diet  5. Intermittent asthma without complication, unspecified asthma severity  - VENTOLIN HFA 108 (90 Base) MCG/ACT inhaler; Inhale 1-2 puffs into the lungs every 4 (four) hours as needed for wheezing or shortness of breath.  Dispense: 18 g; Refill: 3  6. Family planning services  - etonogestrel-ethinyl estradiol (NUVARING) 0.12-0.015 MG/24HR vaginal ring; Insert vaginally and leave in place for 3 consecutive weeks, then remove for 1 week.  Dispense: 3 each; Refill: 3     Follow up plan: Return in about 6 months (around 09/05/2021) for follow up.

## 2021-03-09 ENCOUNTER — Telehealth: Payer: Self-pay | Admitting: Nurse Practitioner

## 2021-03-09 DIAGNOSIS — J4520 Mild intermittent asthma, uncomplicated: Secondary | ICD-10-CM

## 2021-03-10 ENCOUNTER — Telehealth: Payer: Self-pay

## 2021-03-10 ENCOUNTER — Other Ambulatory Visit: Payer: Self-pay | Admitting: Nurse Practitioner

## 2021-03-10 NOTE — Telephone Encounter (Signed)
Requested medication (s) are due for refill today:   Yes  Requested medication (s) are on the active medication list:   Yes  Future visit scheduled:   Yes   Last ordered: 03/08/2021 18 g, 3 refills  Returned because pharmacy requesting an alternative.  Insurance doesn't cover this.   Requested Prescriptions  Pending Prescriptions Disp Refills   VENTOLIN HFA 108 (90 Base) MCG/ACT inhaler [Pharmacy Med Name: VENTOLIN HFA        AER] 18 g 3    Sig: INHALE 1 TO 2 PUFFS BY MOUTH EVERY 4 HOURS AS NEEDED FOR WHEEZING AND FOR SHORTNESS OF BREATH     Pulmonology:  Beta Agonists Failed - 03/09/2021 12:35 PM      Failed - One inhaler should last at least one month. If the patient is requesting refills earlier, contact the patient to check for uncontrolled symptoms.      Passed - Valid encounter within last 12 months    Recent Outpatient Visits           2 days ago Obesity, Class III, BMI 40-49.9 (morbid obesity) [facility])   [facility] Berniece Salines, FNP   1 month ago Physical exam, annual   [facility] Berniece Salines, FNP   2 months ago Generalized anxiety disorder   [facility] Berniece Salines, FNP   1 year ago Generalized anxiety disorder   [facility] Danelle Berry, PA-C   1 year ago Depression with anxiety   [facility] Danelle Berry, PA-C       Future Appointments             In 6 months Zane Herald, Rudolpho Sevin, FNP [facility]   In 11 months Danelle Berry, PA-C [facility]

## 2021-03-15 NOTE — Telephone Encounter (Signed)
Pt has been contacted and she will call her insurance to see what they cover, and will call our office back

## 2021-03-17 NOTE — Telephone Encounter (Signed)
Completed by Raynelle Fanning

## 2021-03-25 ENCOUNTER — Telehealth (INDEPENDENT_AMBULATORY_CARE_PROVIDER_SITE_OTHER): Payer: BC Managed Care – PPO | Admitting: Nurse Practitioner

## 2021-03-25 ENCOUNTER — Telehealth: Payer: Self-pay

## 2021-03-25 ENCOUNTER — Other Ambulatory Visit: Payer: Self-pay | Admitting: Nurse Practitioner

## 2021-03-25 DIAGNOSIS — J4521 Mild intermittent asthma with (acute) exacerbation: Secondary | ICD-10-CM | POA: Diagnosis not present

## 2021-03-25 DIAGNOSIS — J069 Acute upper respiratory infection, unspecified: Secondary | ICD-10-CM

## 2021-03-25 DIAGNOSIS — J4520 Mild intermittent asthma, uncomplicated: Secondary | ICD-10-CM

## 2021-03-25 LAB — POCT INFLUENZA A/B
Influenza A, POC: NEGATIVE
Influenza B, POC: NEGATIVE

## 2021-03-25 NOTE — Progress Notes (Signed)
Name: Diane Williamson   MRN: [id number]    DOB: 17-Nov-1990   Date:03/25/2021       Progress Note  Subjective  Chief Complaint  Chief Complaint  Patient presents with   Generalized Body Aches   Cough    Pt states sx started yesterday afternoon, has not done any COVID test. No fever at the moment but poor appetite.    I connected with  LAKIRA OGANDO  on 03/25/21 at 08:55 am by a video enabled telemedicine application and verified that I am speaking with the correct person using two identifiers.  I discussed the limitations of evaluation and management by telemedicine and the availability of in person appointments. The patient expressed understanding and agreed to proceed with a virtual visit  Staff also discussed with the patient that there may be a patient responsible charge related to this service. Patient Location: home Provider Location: [facility] Additional Individuals present: alone  HPI  URI: She says symptoms started yesterday.  She has body aches, chills, feverish feeling and cough.  She says her cough is productive.  She denies shortness of breath but says she is wheezing.  She has not used her albuterol inhaler because she does not currently have one.  Discussed picking up her prescription.  Will give sample of Air duo. Discussed usage for while she is sick.  Discussed OTC treatments for symptoms.  Will also test for Covid and Flu.  Discussed those treatments and she would like to do them if she is positive.   Asthma: She says she has had trouble getting her albuterol inhaler filled.  She says she checked with her insurance and they do cover it.  She is going to check with [facility] again and see if it is because they do not have it in stock and we will just send to another pharmacy.   Patient Active Problem List   Diagnosis Date Noted   Generalized anxiety disorder 12/30/2019   Depression with anxiety 12/30/2019   Chronic bilateral low back pain without sciatica 12/30/2019    Psychophysiological insomnia 12/30/2019   Carpal tunnel syndrome of right wrist 11/16/2019   Dysplasia of cervix, low grade (CIN 1) 07/04/2019   Chronic venous insufficiency 09/03/2018   Eczema 07/03/2018   Allergic rhinitis 07/03/2018   GERD (gastroesophageal reflux disease) 07/03/2018   Vitamin D deficiency 07/03/2018   Obesity (BMI 35.0-39.9 without comorbidity) 06/07/2018   Uncontrolled persistent asthma 06/07/2018   Anemia 06/07/2018   Hematuria, microscopic 06/07/2018   ADHD (attention deficit hyperactivity disorder), 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/2018   Varicose veins of both lower extremities with pain 06/07/2018   Adjustment disorder with mixed anxiety and depressed mood 10/10/2017   Personal history of nonsuicidal self-harm 08/29/2017   Vitiligo 12/08/2010    Social History   Tobacco Use   Smoking status: Former    Packs/day: 0.50    Years: 7.00    Pack years: 3.50    Types: Cigarettes    Quit date: 01/17/2016    Years since quitting: 5.1   Smokeless tobacco: Never  Substance Use Topics   Alcohol use: Yes    Alcohol/week: 2.0 standard drinks    Types: 2 Glasses of wine per week     Current Outpatient Medications:    acyclovir (ZOVIRAX) 800 MG tablet, TAKE 1 TABLET BY MOUTH THREE TIMES DAILY FOR 2 DAYS WITH  OUTBREAKS,  ONE  DAILY  FOR  SUPPRESSION, Disp: 38 tablet, Rfl: 0   etonogestrel-ethinyl estradiol (NUVARING) 0.12-0.015  MG/24HR vaginal ring, Insert vaginally and leave in place for 3 consecutive weeks, then remove for 1 week., Disp: 3 each, Rfl: 3   ipratropium-albuterol (DUONEB) 0.5-2.5 (3) MG/3ML SOLN, Take 3 mLs by nebulization every 4 (four) hours as needed., Disp: 360 mL, Rfl: 0   sertraline (ZOLOFT) 100 MG tablet, Take 2 tablets (200 mg total) by mouth daily., Disp: 180 tablet, Rfl: 3   VENTOLIN HFA 108 (90 Base) MCG/ACT inhaler, Inhale 1-2 puffs into the lungs every 4 (four) hours as needed for wheezing or shortness of breath., Disp: 18 g, Rfl: 3  Allergies   Allergen Reactions   Benadryl Allergy [Diphenhydramine Hcl]     Rapid heart rate.    I personally reviewed active problem list, medication list, allergies, notes from last encounter with the patient/caregiver today.  ROS  Constitutional: Positive  for feverish or weight change.  Respiratory: Positive for cough and wheezing, negative for shortness of breath.   Cardiovascular: Negative for chest pain or palpitations.  Gastrointestinal: Negative for abdominal pain, no bowel changes.  Musculoskeletal: Negative for gait problem or joint swelling.  Skin: Negative for rash.  Neurological: Negative for dizziness or headache.  No other specific complaints in a complete review of systems (except as listed in HPI above).   Objective  Virtual encounter, vitals not obtained.  There is no height or weight on file to calculate BMI.  Nursing Note and Vital Signs reviewed.  Physical Exam  Awake, alert and oriented, speaking in complete sentences  No results found for this or any previous visit (from the past 72 hour(s)).  Assessment & Plan  1. Viral upper respiratory tract infection -push fluids -OTC treatments for symptoms discussed - Novel Coronavirus, NAA ([facility]) - POCT Influenza A/B -if above testing is positive will send in treatment as discussed 2. Mild intermittent asthma with acute exacerbation -pick up albuterol from pharmacy -air duo sample given  -Red flags and when to present for emergency care or RTC including fever >101.45F, chest pain, shortness of breath, new/worsening/un-resolving symptoms, reviewed with patient at time of visit. Follow up and care instructions discussed and provided in AVS. - I discussed the assessment and treatment plan with the patient. The patient was provided an opportunity to ask questions and all were answered. The patient agreed with the plan and demonstrated an understanding of the instructions.  I provided 20 minutes of non-face-to-face  time during this encounter.  Bo Merino, FNP

## 2021-03-25 NOTE — Telephone Encounter (Signed)
  Notes to clinic:  pharmacy requesting change due to insurance, please assess.  Requested Prescriptions  Pending Prescriptions Disp Refills   VENTOLIN HFA 108 (90 Base) MCG/ACT inhaler [Pharmacy Med Name: VENTOLIN HFA        AER] 18 g 3    Sig: INHALE 1 TO 2 PUFFS BY MOUTH EVERY 4 HOURS AS NEEDED FOR WHEEZING AND FOR SHORTNESS OF BREATH     Pulmonology:  Beta Agonists Failed - 03/25/2021  3:17 PM      Failed - One inhaler should last at least one month. If the patient is requesting refills earlier, contact the patient to check for uncontrolled symptoms.      Passed - Valid encounter within last 12 months    Recent Outpatient Visits           Today Viral upper respiratory tract infection   [facility] Della Goo F, FNP   2 weeks ago Obesity, Class III, BMI 40-49.9 (morbid obesity) [facility])   [facility] Berniece Salines, FNP   1 month ago Physical exam, annual   [facility] Berniece Salines, FNP   2 months ago Generalized anxiety disorder   [facility] Berniece Salines, FNP   1 year ago Generalized anxiety disorder   [facility] Danelle Berry, PA-C       Future Appointments             In 5 months Zane Herald, Rudolpho Sevin, FNP [facility]   In 10 months Danelle Berry, PA-C [facility], [facility]

## 2021-03-25 NOTE — Telephone Encounter (Signed)
Copied from CRM [id number]. Topic: General - Other >> Mar 24, 2021  4:17 PM Gaetana Michaelis A wrote: Reason for CRM: The patient has spoken with their insurance provider and been notified that [employer] will be able to cover the cost of a prescription for a ventolin inhaler   The patient would like the prescription for the inhaler submitted to [facility] [address] [place] - [address] [place] [address] Phone: [phone] Fax: [phone] Hours: Not open 24 hours  Please contact further if needed

## 2021-03-26 ENCOUNTER — Telehealth: Payer: Self-pay

## 2021-03-26 LAB — NOVEL CORONAVIRUS, NAA: SARS-CoV-2, NAA: NOT DETECTED

## 2021-03-26 LAB — SPECIMEN STATUS REPORT

## 2021-03-26 NOTE — Telephone Encounter (Signed)
Copied from CRM [id number]. Topic: General - Other >> Mar 26, 2021 11:16 AM Gaetana Michaelis A wrote: Reason for CRM: The patient has been told by a member of their pharmacy's staff that prior authorization is required for their VENTOLIN HFA 108 (90 Base) MCG/ACT inhaler [[id number]] prescription  Please contact [facility] [address] [place] - [address]  [address] [place] [address]  Phone:  [phone] Fax:  [phone]  when possible

## 2021-03-26 NOTE — Telephone Encounter (Signed)
Have you seen anything regarding this?

## 2021-03-29 ENCOUNTER — Other Ambulatory Visit: Payer: Self-pay

## 2021-04-02 ENCOUNTER — Other Ambulatory Visit: Payer: Self-pay | Admitting: Nurse Practitioner

## 2021-04-02 DIAGNOSIS — J4520 Mild intermittent asthma, uncomplicated: Secondary | ICD-10-CM

## 2021-04-03 NOTE — Telephone Encounter (Signed)
° °  Notes to clinic:  pharmacy sending request due to [facility] not covered by insurance, please assess.   Requested Prescriptions  Pending Prescriptions Disp Refills   VENTOLIN HFA 108 (90 Base) MCG/ACT inhaler [Pharmacy Med Name: VENTOLIN HFA        AER] 18 g 3    Sig: INHALE 1 TO 2 PUFFS BY MOUTH EVERY 4 HOURS AS NEEDED FOR WHEEZING AND FOR SHORTNESS OF BREATH     Pulmonology:  Beta Agonists Failed - 04/02/2021  7:50 PM      Failed - One inhaler should last at least one month. If the patient is requesting refills earlier, contact the patient to check for uncontrolled symptoms.      Passed - Valid encounter within last 12 months    Recent Outpatient Visits           1 week ago Viral upper respiratory tract infection   [facility] Della Goo F, FNP   3 weeks ago Obesity, Class III, BMI 40-49.9 (morbid obesity) [facility])   [facility] Berniece Salines, FNP   2 months ago Physical exam, annual   [facility] Berniece Salines, FNP   3 months ago Generalized anxiety disorder   [facility] Berniece Salines, FNP   1 year ago Generalized anxiety disorder   [facility] Danelle Berry, PA-C       Future Appointments             In 5 months Zane Herald, Rudolpho Sevin, FNP [facility], [facility]   In 10 months Danelle Berry, PA-C [facility], [facility]

## 2021-05-24 ENCOUNTER — Telehealth: Payer: Self-pay | Admitting: *Deleted

## 2021-05-24 ENCOUNTER — Telehealth: Payer: Self-pay | Admitting: Nurse Practitioner

## 2021-05-24 DIAGNOSIS — F418 Other specified anxiety disorders: Secondary | ICD-10-CM

## 2021-05-24 DIAGNOSIS — F5104 Psychophysiologic insomnia: Secondary | ICD-10-CM

## 2021-05-24 DIAGNOSIS — F411 Generalized anxiety disorder: Secondary | ICD-10-CM

## 2021-05-24 NOTE — Telephone Encounter (Signed)
Medication Refill - Medication:  sertraline (ZOLOFT) 100 MG tablet  busPIRone (BUSPAR) 10 MG tablet   Has the patient contacted their pharmacy? Yes.   Contact PCP  Preferred Pharmacy (with phone number or street name):  [facility] [address], [place] - [address]  [address] [place] [address]  Phone:  [phone]  Fax:  [phone]  Has the patient been seen for an appointment in the last year OR does the patient have an upcoming appointment? Yes.    Agent: Please be advised that RX refills may take up to 3 business days. We ask that you follow-up with your pharmacy.

## 2021-05-24 NOTE — Telephone Encounter (Signed)
Patient called, left VM to return the call to the office to speak to a nurse about the refills. Sertraline was sent to [facility] on 01/01/21 #180/3 refills (enough to last until 01/01/22). Buspar was discontinued by provider on 01/01/21 and replaced with Sertraline. Will need to discuss this with the patient.

## 2021-05-24 NOTE — Telephone Encounter (Signed)
Pt returned our call.   [facility] went to [facility] to [facility]. [facility] told her that her insurance was not covering this medication and would not give it to her.  She was also under the impressions that she was to still be taking the Buspar. She did not know that it was discontinued.  Pt will call her insurance in the morning to see why her medication was declined.   Pt would like a call back regarding the Buspar.

## 2021-05-25 NOTE — Telephone Encounter (Signed)
Patient was informed to check her pharmacy a 1 year supply was called in 01/01/2021

## 2021-05-25 NOTE — Telephone Encounter (Signed)
Pt rdetured  called back to request med buprofin and sertraline (ZOLOFT) 100 MG tablet . Please call back

## 2021-05-25 NOTE — Telephone Encounter (Signed)
What was reason Buspar discontinued. I do not see in her chart Tried to call patient, left message for her to call office back to discuss medication.

## 2021-05-25 NOTE — Telephone Encounter (Signed)
Patient stated she needed Sertraline

## 2021-06-10 ENCOUNTER — Ambulatory Visit: Payer: BC Managed Care – PPO | Admitting: Licensed Clinical Social Worker

## 2021-06-10 DIAGNOSIS — F1010 Alcohol abuse, uncomplicated: Secondary | ICD-10-CM

## 2021-06-10 DIAGNOSIS — F411 Generalized anxiety disorder: Secondary | ICD-10-CM

## 2021-06-10 DIAGNOSIS — F4321 Adjustment disorder with depressed mood: Secondary | ICD-10-CM
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
## 2021-06-23 ENCOUNTER — Ambulatory Visit: Payer: Self-pay | Admitting: Licensed Clinical Social Worker

## 2021-07-14 ENCOUNTER — Ambulatory Visit: Payer: Self-pay | Admitting: Licensed Clinical Social Worker

## 2021-07-14 DIAGNOSIS — F4321 Adjustment disorder with depressed mood: Secondary | ICD-10-CM

## 2021-07-14 DIAGNOSIS — F411 Generalized anxiety disorder: Secondary | ICD-10-CM

## 2021-07-14 DIAGNOSIS — F1010 Alcohol abuse, uncomplicated: Secondary | ICD-10-CM

## 2021-07-14 NOTE — Progress Notes (Signed)
Counselor/Therapist Progress Note ? ?Patient ID: Diane Williamson, MRN: [id number],   ? ?Date: 07/14/2021 ? ?Time Spent: 48 minutes   ? ?Treatment Type: Psychotherapy ? ?Reported Symptoms:  Decrease in both depressive symptoms and anxiety; 2 panic attacks in one month; decrease use of alcohol ? ?Mental Status Exam: ? ?Appearance:   Casual and Neat     ?Behavior:  Appropriate and Sharing  ?Motor:  Normal  ?Speech/Language:   Clear and Coherent and Normal Rate  ?Affect:  Appropriate, Congruent, and Full Range  ?Mood:  normal  ?Thought process:  normal  ?Thought content:    WNL  ?Sensory/Perceptual disturbances:    WNL  ?Orientation:  oriented to person, place, time/date, and situation  ?Attention:  Good  ?Concentration:  Good  ?Memory:  WNL  ?Fund of knowledge:   Good  ?Insight:    Good  ?Judgment:   Good  ?Impulse Control:  Good  ? ?Risk Assessment: ?Danger to Self:  No ?Self-injurious Behavior: No ?Danger to Others: No ?Duty to Warn:no ?Physical Aggression / Violence:No  ?Access to Firearms a concern: No  ?Gang Involvement:No  ? ?Subjective: Patient was receptive to feedback and intervention from LCSW and actively and effectively participated throughout the session discussing thoughts, feelings, and treatment plan. Patient is likely to benefit from future treatment because she remains motivated to decrease symptoms.   ? ?Interventions: Cognitive Behavioral Therapy ?Checked in with patient and reviewed previous session, including assessment and goal of treatment. Reviewed CBTs. Explored patient's goal of treatment and worked collaboratively to develop CBTs treatment plan. Provided support through active listening, validation of feelings, and highlighted patient's strengths.  ? ?Diagnosis: ?  ICD-10-CM   ?1. Adjustment disorder with depressed mood  F43.21   ?  ?2. Generalized anxiety disorder  F41.1   ?  ?3. Mild alcohol use disorder  F10.10   ?  ? ?Plan: Patient's goal of treatment is to reduce alcohol use, and wants  peace and to be happy.  ?Treatment Target: Understand the relationship between thoughts, emotions, and behaviors  ?Psychoeducation on CBT model   ?Teach the connection between thoughts, emotions, and behaviors  ? ?Treatment Target: Increase realistic balanced thinking -to learn how to replace thinking with thoughts that are more accurate or helpful ?Explore patient's thoughts, beliefs, automatic thoughts, assumptions  ?Identify and replace unhelpful thinking patterns (upsetting ideas, self-talk and mental images) ?Process distress and allow for emotional release  ?Questioning and challenging thoughts ?Cognitive reappraisal  ?Restructuring, Socratic questioning  ? ?Treatment Target: Reducing vulnerability to ?emotional mind? ?Values clarification   ?Self-care - nutrition, sleep, exercise  ?Increase positive events  ?Activity planning  ? ?Treatment Target: Increase coping skills ?Mindfulness practices  ?Intentional breathing  ?Grounding techniques as necessary  ?STOP technique ?Distress tolerance skills  ? ?Future Appointments  ?Date Time Provider [place]  ?08/05/2021  9:00 AM Milton Ferguson, LCSW [facility] None  ?09/06/2021  8:40 AM Bo Merino, FNP [place] [facility]  ?02/03/2022  8:20 AM Delsa Grana, PA-C [place] [facility]  ? ? ?Milton Ferguson, LCSW ? ?

## 2021-08-05 ENCOUNTER — Ambulatory Visit: Payer: Self-pay | Admitting: Licensed Clinical Social Worker

## 2021-09-01 ENCOUNTER — Ambulatory Visit: Payer: Self-pay | Admitting: Licensed Clinical Social Worker

## 2021-09-01 DIAGNOSIS — F4321 Adjustment disorder with depressed mood: Secondary | ICD-10-CM

## 2021-09-01 DIAGNOSIS — F411 Generalized anxiety disorder: Secondary | ICD-10-CM

## 2021-09-01 DIAGNOSIS — F1010 Alcohol abuse, uncomplicated: Secondary | ICD-10-CM

## 2021-09-01 NOTE — Progress Notes (Signed)
Counselor/Therapist Progress Note ? ?Patient ID: Diane Williamson, MRN: [id number],   ? ?Date: 09/01/2021 ? ?Time Spent: 42 minutes  ? ?Treatment Type: Psychotherapy ? ?Reported Symptoms:  mild anxiety, anxious thoughts, overwhelm; alcohol cravings, drinking one serving of wine every other day ? ?Mental Status Exam: ? ?Appearance:   Casual     ?Behavior:  Appropriate and Sharing  ?Motor:  Normal  ?Speech/Language:   Clear and Coherent and Normal Rate  ?Affect:  Appropriate, Congruent, and Full Range  ?Mood:  normal  ?Thought process:  normal  ?Thought content:    WNL  ?Sensory/Perceptual disturbances:    WNL  ?Orientation:  oriented to person, place, time/date, and situation  ?Attention:  Good  ?Concentration:  Good  ?Memory:  WNL  ?Fund of knowledge:   Good  ?Insight:    Good  ?Judgment:   Good  ?Impulse Control:  Good  ? ?Risk Assessment: ?Danger to Self:  No ?Self-injurious Behavior: No ?Danger to Others: No ?Duty to Warn:no ?Physical Aggression / Violence:No  ?Access to Firearms a concern: No  ?Gang Involvement:No  ? ?Subjective: Patient was receptive to feedback and intervention from LCSW and actively and effectively participated throughout the session. Patient is likely to benefit from future treatment because she remains motivated to decrease symptoms and improve functioning and reports benefit of regular sessions in addressing these symptoms.    ? ?Interventions: Cognitive Behavioral Therapy ?Checked in with patient regarding past few weeks. LCSW assisted patient in processing their thoughts and emotions about what they have experienced with parent with health issue, relationship stressors, work stressors and concerns about weight. LCSW explored patient's concerns related to health and her weight identifying unhelpful thoughts leading to avoidance behaviors, highlighting these thoughts and trying to create more flexibility in these thoughts. Provided support through active listening, validation of feelings, and  highlighted patient's strengths.   ? ?Diagnosis: ?  ICD-10-CM   ?1. Adjustment disorder with depressed mood  F43.21   ?  ?2. Generalized anxiety disorder  F41.1   ?  ?3. Mild alcohol use disorder  F10.10   ?  ? ?Plan: Check in on Weight Loss Goal and Career goals  ?Patient's goal of treatment is to reduce alcohol use, and wants peace and to be happy.  ?Treatment Target: Understand the relationship between thoughts, emotions, and behaviors  ?Psychoeducation on CBT model   ?Teach the connection between thoughts, emotions, and behaviors  ?  ?Treatment Target: Increase realistic balanced thinking -to learn how to replace thinking with thoughts that are more accurate or helpful ?Explore patient's thoughts, beliefs, automatic thoughts, assumptions  ?Identify and replace unhelpful thinking patterns (upsetting ideas, self-talk and mental images) ?Process distress and allow for emotional release  ?Questioning and challenging thoughts ?Cognitive reappraisal  ?Restructuring, Socratic questioning  ?  ?Treatment Target: Reducing vulnerability to ?emotional mind? ?Values clarification   ?Self-care - nutrition, sleep, exercise  ?Increase positive events  ?Activity planning  ?  ?Treatment Target: Increase coping skills ?Mindfulness practices  ?Intentional breathing  ?Grounding techniques as necessary  ?STOP technique ?Distress tolerance skills  ? ?Future Appointments  ?Date Time Provider [place]  ?09/06/2021  8:40 AM Bo Merino, FNP [place] [facility]  ?09/15/2021  2:10 PM Milton Ferguson, LCSW [facility] None  ?02/03/2022  8:20 AM Delsa Grana, PA-C [place] [facility]  ? ?Milton Ferguson, LCSW ? ?

## 2021-09-06 ENCOUNTER — Ambulatory Visit: Payer: BC Managed Care – PPO | Admitting: Nurse Practitioner

## 2021-09-15 ENCOUNTER — Ambulatory Visit: Payer: Self-pay | Admitting: Licensed Clinical Social Worker

## 2021-09-15 DIAGNOSIS — F411 Generalized anxiety disorder: Secondary | ICD-10-CM

## 2021-09-15 DIAGNOSIS — F4321 Adjustment disorder with depressed mood: Secondary | ICD-10-CM

## 2021-09-15 DIAGNOSIS — F1010 Alcohol abuse, uncomplicated: Secondary | ICD-10-CM

## 2021-09-15 NOTE — Progress Notes (Signed)
Counselor/Therapist Progress Note  Patient ID: Diane Williamson, MRN: [id number],    Date: 09/15/2021  Time Spent: 45 minutes    Treatment Type: Psychotherapy  Reported Symptoms:  Anxiety, panic sensations, irritability; low mood   Mental Status Exam:  Appearance:   Casual and Neat     Behavior:  Appropriate and Sharing  Motor:  Normal  Speech/Language:   Clear and Coherent and Normal Rate  Affect:  Appropriate, Congruent, and Full Range  Mood:  normal  Thought process:  normal  Thought content:    WNL  Sensory/Perceptual disturbances:    WNL  Orientation:  oriented to person, place, time/date, and situation  Attention:  Good  Concentration:  Good  Memory:  WNL  Fund of knowledge:   Good  Insight:    Good  Judgment:   Good  Impulse Control:  Good   Risk Assessment: Danger to Self:  No Self-injurious Behavior: No Danger to Others: No Duty to Warn:no Physical Aggression / Violence:No  Access to Firearms a concern: No  Gang Involvement:No   Subjective: Patient was engaged and cooperative throughout the session using time effectively to discuss thoughts and feelings. Patient was receptive to feedback and intervention from LCSW. Patient is likely to benefit from future treatment because they remain motivated to decrease depressive symptoms and anxiety symptoms and reports benefit of regular sessions.        Interventions: Cognitive Behavioral Therapy Checked in with patient regarding their week. LCSW assisted patient in processing their thoughts and emotions about what they have experienced at work over the last two weeks. LCSW highlighted unhelpful thoughts leading to distress reframing and challenging these thoughts, and taught patient Unhelpful Thinking Styles. LCSW and patient also discussed her quitting vaping over the last two weeks and drinking too much (large bottle of wine) twice. Encouraged patient to be gentle with herself. Provided support through active listening,  validation of feelings, and highlighted patient's strengths.    Diagnosis:   ICD-10-CM   1. Adjustment disorder with depressed mood  F43.21     2. Generalized anxiety disorder  F41.1     3. Mild alcohol use disorder  F10.10      Plan: Check in on Weight Loss Goal and Career goals  Patient's goal of treatment is to reduce alcohol use, and wants peace and to be happy.  Treatment Target: Understand the relationship between thoughts, emotions, and behaviors  Psychoeducation on CBT model   Teach the connection between thoughts, emotions, and behaviors    Treatment Target: Increase realistic balanced thinking -to learn how to replace thinking with thoughts that are more accurate or helpful Explore patient's thoughts, beliefs, automatic thoughts, assumptions  Identify and replace unhelpful thinking patterns (upsetting ideas, self-talk and mental images) Process distress and allow for emotional release  Questioning and challenging thoughts Cognitive reappraisal  Restructuring, Socratic questioning    Treatment Target: Reducing vulnerability to "emotional mind" Values clarification   Self-care - nutrition, sleep, exercise  Increase positive events  Activity planning    Treatment Target: Increase coping skills Mindfulness practices  Intentional breathing  Grounding techniques as necessary  STOP technique Distress tolerance skills   Future Appointments  Date Time Provider Department Center  09/30/2021  2:10 PM Kathreen Cosier, LCSW [facility] None  02/03/2022  8:20 AM Danelle Berry, PA-C [facility]   Kathreen Cosier, LCSW

## 2021-09-30 ENCOUNTER — Ambulatory Visit: Payer: Self-pay | Admitting: Licensed Clinical Social Worker

## 2021-09-30 NOTE — Progress Notes (Unsigned)
Counselor/Therapist Progress Note  Patient ID: Diane Williamson, MRN: [id number],    Date: 09/30/2021  Time Spent: ***   Treatment Type: Psychotherapy  Reported Symptoms: {CHL AMB Reported [phone]}  Mental Status Exam:  Appearance:   {PSY:22683}     Behavior:  {PSY:21022743}  Motor:  {PSY:22302}  Speech/Language:   {PSY:22685}  Affect:  {PSY:22687}  Mood:  {PSY:31886}  Thought process:  {PSY:31888}  Thought content:    {[phone]}  Sensory/Perceptual disturbances:    {[phone]}  Orientation:  {PSY:30297}  Attention:  {PSY:22877}  Concentration:  {PSY:410 241 8397}  Memory:  {[phone]}  Fund of knowledge:   {PSY:410 241 8397}  Insight:    {PSY:410 241 8397}  Judgment:   {PSY:410 241 8397}  Impulse Control:  {PSY:410 241 8397}   Risk Assessment: Danger to Self:  No Self-injurious Behavior: No Danger to Others: No Duty to Warn:no Physical Aggression / Violence:No  Access to Firearms a concern: No  Gang Involvement:No   Subjective: ***   Interventions: {[phone]}  Diagnosis:No diagnosis found.  Plan: Check in on Weight Loss Goal and Career goals  Patient's goal of treatment is to reduce alcohol use, and wants peace and to be happy.  Treatment Target: Understand the relationship between thoughts, emotions, and behaviors  Psychoeducation on CBT model   Teach the connection between thoughts, emotions, and behaviors    Treatment Target: Increase realistic balanced thinking -to learn how to replace thinking with thoughts that are more accurate or helpful Explore patient's thoughts, beliefs, automatic thoughts, assumptions  Identify and replace unhelpful thinking patterns (upsetting ideas, self-talk and mental images) Process distress and allow for emotional release  Questioning and challenging thoughts Cognitive reappraisal  Restructuring, Socratic questioning    Treatment Target: Reducing vulnerability to "emotional mind" Values clarification    Self-care - nutrition, sleep, exercise  Increase positive events  Activity planning    Treatment Target: Increase coping skills Mindfulness practices  Intentional breathing  Grounding techniques as necessary  STOP technique Distress tolerance skills  Future Appointments  Date Time Provider Department Center  09/30/2021  2:10 PM Kathreen Cosier, LCSW [facility] None  02/03/2022  8:20 AM Danelle Berry, PA-C [facility]   Kathreen Cosier, LCSW

## 2021-12-21 ENCOUNTER — Ambulatory Visit: Payer: Self-pay

## 2021-12-21 ENCOUNTER — Encounter: Payer: Self-pay | Admitting: Nurse Practitioner

## 2021-12-21 ENCOUNTER — Other Ambulatory Visit
Admission: RE | Admit: 2021-12-21 | Discharge: 2021-12-21 | Disposition: A | Discharge status: Home / Self Care | Payer: Managed Care, Other (non HMO) | Attending: Nurse Practitioner | Admitting: Nurse Practitioner

## 2021-12-21 ENCOUNTER — Other Ambulatory Visit: Payer: Self-pay

## 2021-12-21 ENCOUNTER — Ambulatory Visit (INDEPENDENT_AMBULATORY_CARE_PROVIDER_SITE_OTHER): Payer: Self-pay | Admitting: Nurse Practitioner

## 2021-12-21 VITALS — BP 128/82 | HR 100 | Temp 98.2°F | Resp 18 | Ht 71.0 in | Wt 306.1 lb

## 2021-12-21 DIAGNOSIS — R252 Cramp and spasm: Secondary | ICD-10-CM | POA: Diagnosis present

## 2021-12-21 DIAGNOSIS — N920 Excessive and frequent menstruation with regular cycle: Secondary | ICD-10-CM | POA: Insufficient documentation

## 2021-12-21 DIAGNOSIS — N946 Dysmenorrhea, unspecified: Secondary | ICD-10-CM

## 2021-12-21 DIAGNOSIS — G4700 Insomnia, unspecified: Secondary | ICD-10-CM

## 2021-12-21 LAB — MAGNESIUM: Magnesium: 2 mg/dL (ref 1.7–2.4)

## 2021-12-21 LAB — COMPREHENSIVE METABOLIC PANEL
ALT: 10 U/L (ref 0–44)
AST: 16 U/L (ref 15–41)
Albumin: 3.5 g/dL (ref 3.5–5.0)
Alkaline Phosphatase: 69 U/L (ref 38–126)
Anion gap: 8 (ref 5–15)
BUN: 11 mg/dL (ref 6–20)
CO2: 22 mmol/L (ref 22–32)
Calcium: 8.8 mg/dL — ABNORMAL LOW (ref 8.9–10.3)
Chloride: 110 mmol/L (ref 98–111)
Creatinine, Ser: 0.66 mg/dL (ref 0.44–1.00)
GFR, Estimated: >60 mL/min (ref >60)
Glucose, Bld: 112 mg/dL — ABNORMAL HIGH (ref 70–99)
Potassium: 3.2 mmol/L — ABNORMAL LOW (ref 3.5–5.1)
Sodium: 140 mmol/L (ref 135–145)
Total Bilirubin: 0.5 mg/dL (ref 0.3–1.2)
Total Protein: 7.3 g/dL (ref 6.5–8.1)

## 2021-12-21 LAB — CBC WITH DIFFERENTIAL/PLATELET
Abs Immature Granulocytes: 0.02 10*3/uL (ref 0.00–0.07)
Basophils Absolute: 0 10*3/uL (ref 0.0–0.1)
Basophils Relative: 0 %
Eosinophils Absolute: 0.3 10*3/uL (ref 0.0–0.5)
Eosinophils Relative: 3 %
HCT: 35.5 % — ABNORMAL LOW (ref 36.0–46.0)
Hemoglobin: 11.2 g/dL — ABNORMAL LOW (ref 12.0–15.0)
Immature Granulocytes: 0 %
Lymphocytes Relative: 46 %
Lymphs Abs: 3.9 10*3/uL (ref 0.7–4.0)
MCH: 24.6 pg — ABNORMAL LOW (ref 26.0–34.0)
MCHC: 31.5 g/dL (ref 30.0–36.0)
MCV: 78 fL — ABNORMAL LOW (ref 80.0–100.0)
Monocytes Absolute: 0.4 10*3/uL (ref 0.1–1.0)
Monocytes Relative: 5 %
Neutro Abs: 3.9 10*3/uL (ref 1.7–7.7)
Neutrophils Relative %: 46 %
Platelets: 416 10*3/uL — ABNORMAL HIGH (ref 150–400)
RBC: 4.55 MIL/uL (ref 3.87–5.11)
RDW: 15.2 % (ref 11.5–15.5)
WBC: 8.5 10*3/uL (ref 4.0–10.5)
nRBC: 0 % (ref 0.0–0.2)

## 2021-12-21 LAB — POCT URINE PREGNANCY: Preg Test, Ur: NEGATIVE

## 2021-12-21 LAB — TSH: TSH: 3.2 u[IU]/mL (ref 0.350–4.500)

## 2021-12-21 MED ORDER — ZOLPIDEM TARTRATE 5 MG PO TABS: 5.0000 mg | ORAL_TABLET | Freq: Every evening | ORAL | 1 refills | Status: DC | PRN

## 2021-12-21 MED ORDER — NAPROXEN 500 MG PO TABS
500.0000 mg | ORAL_TABLET | Freq: Two times a day (BID) | ORAL | 0 refills | Status: AC
Start: 2021-12-21 — End: 2021-12-31

## 2021-12-21 NOTE — Progress Notes (Signed)
BP 128/82   Pulse 100   Temp 98.2 F (36.8 C) (Oral)   Resp 18   Ht 5\' 11"  (1.803 m)   Wt (!) 306 lb 1.6 oz (138.8 kg)   LMP 12/20/2021   SpO2 98%   BMI 42.69 kg/m    Subjective:    Patient ID: Diane Williamson, female    DOB: 1990/05/03, 31 y.o.   MRN: [id number]  HPI: Diane Williamson is a 31 y.o. female  Chief Complaint  Patient presents with   Menstrual Problem    Severe pain leading up to period and 2 days after starting, sweating, cant sleep for pass 2 months   Menorrhea/dysmenorrhea: She says that for her last two periods they have been very painful and heavy. Patient reports that she does use the nuvaring.  She did have an abnormal pap in 2021, followed by a colposcopy.  She was told to have a repeat pap in a year but she did not get that done.  Discussed this with patient.  Patient would like to follow up with [facility], will place new referral.  Will also get labs and urine pregnancy test. Discussed patient taking naproxen BID for ten days.    Leg cramps:  She says that her legs have been hurting for a few months. She says that she feels like her legs need to constantly be moving or she will cramp up.  Will get labs.   Insomnia: Patient reports she has not been sleeping well.  She would like something to help her sleep.  Discussed hydroxyzine but patient is allergic to benadryl.  Will give her a trial of Ambien and see if that helps.   Relevant past medical, surgical, family and social history reviewed and updated as indicated. Interim medical history since our last visit reviewed. Allergies and medications reviewed and updated.  Review of Systems  Constitutional: Negative for fever or weight change.  Respiratory: Negative for cough and shortness of breath.   Cardiovascular: Negative for chest pain or palpitations.  Gastrointestinal: Negative for abdominal pain, no bowel changes.  GU: vaginal bleeding Musculoskeletal: Negative for gait problem or joint swelling.   Skin: Negative for rash.  Neurological: Negative for dizziness or headache.  No other specific complaints in a complete review of systems (except as listed in HPI above).      Objective:    BP 128/82   Pulse 100   Temp 98.2 F (36.8 C) (Oral)   Resp 18   Ht 5\' 11"  (1.803 m)   Wt (!) 306 lb 1.6 oz (138.8 kg)   LMP 12/20/2021   SpO2 98%   BMI 42.69 kg/m   Wt Readings from Last 3 Encounters:  12/21/21 (!) 306 lb 1.6 oz (138.8 kg)  03/08/21 (!) 310 lb (140.6 kg)  02/01/21 (!) 315 lb 1.6 oz (142.9 kg)    Physical Exam Constitutional: Patient appears well-developed and well-nourished. Obese  No distress.  HEENT: head atraumatic, normocephalic, pupils equal and reactive to light,  neck supple Cardiovascular: Normal rate, regular rhythm and normal heart sounds.  No murmur heard. No BLE edema. Pulmonary/Chest: Effort normal and breath sounds normal. No respiratory distress. Abdominal: Soft.  There is no tenderness. Psychiatric: Patient has a normal mood and affect. behavior is normal. Judgment and thought content normal.      Assessment & Plan:   Problem List Items Addressed This Visit   None Visit Diagnoses     Menorrhagia with regular cycle    -  Primary   referral placed for GYN, start naproxen 500 mg BID, get labs and urine pregnancy test   Relevant Medications   naproxen (NAPROSYN) 500 MG tablet   Other Relevant Orders   CBC with Differential/Platelet   TSH   Comprehensive metabolic panel   Ambulatory referral to Gynecology   POCT urine pregnancy (Completed)   Dysmenorrhea       referral placed for GYN, start naproxen 500 mg BID, get labs and urine pregnancy test   Relevant Medications   naproxen (NAPROSYN) 500 MG tablet   Other Relevant Orders   CBC with Differential/Platelet   TSH   Comprehensive metabolic panel   Ambulatory referral to Gynecology   POCT urine pregnancy (Completed)   Bilateral leg cramps       pt reports leg cramps, will get labs   Relevant  Orders   Magnesium   Comprehensive metabolic panel   Insomnia, unspecified type       patient reports trouble falling and staying asleep, will trial ambien and see if that helps.    Relevant Medications   zolpidem (AMBIEN) 5 MG tablet      Upreg was negative  Follow up plan: Return if symptoms worsen or fail to improve.

## 2021-12-21 NOTE — Telephone Encounter (Signed)
  Chief Complaint: Abdominal pain 10/10 heavy period Symptoms: Chills sweating, back pain Frequency: Last months and again this month Pertinent Negatives: Patient denies Fever Disposition: [] ED /[] Urgent Care (no appt availability in office) / [x] Appointment(In office/virtual)/ []  [facility]/ [] Home Care/ [] Refused Recommended Disposition /[]  Mobile Bus/ []  Follow-up with PCP Additional Notes: Pt states that she has had heavy periods the past 2 months with abdominal pain of 10/10. Pain also goes to her back. Pt reports sweating and chills. PT has not taken her temperature. Pain goes from knees to abdomen. Pt also reports restless legs.   Reason for Disposition  [1] MODERATE vaginal bleeding (e.g., soaking 1 pad or tampon per hour and present > 6 hours; 1 menstrual cup every 6 hours) AND [2] worse than ever before  Answer Assessment - Initial Assessment Questions 1. LOCATION: "Where does it hurt?"      Abdomen, back 2. ONSET: "When did this episode of pain begin?"       1 week ago, but also had this last month during. 3. SEVERITY: "How bad is the pain?" "Are you missing school or work because of the pain?"  (e.g., Scale 1-10; mild, moderate, or severe)   - MILD (1-3): Doesn't interfere with normal activities, lasting 1 to 2 days.    - MODERATE (4-7): Interferes with normal activities (missing work or school), lasting 2 to 3 days, some associated GI symptoms.    - SEVERE (8-10): Excruciating pain, lasting 2-7 days, associated GI symptoms, pain radiating into thighs and back.     10/10 4. VAGINAL BLEEDING: "Describe the bleeding that you are having." "How much bleeding is there?"    - SPOTTING: spotting, or pinkish / brownish mucous discharge; does not fill panty liner or pad    - MILD:  less than 1 pad / hour; less than patient's usual menstrual bleeding   - MODERATE: 1-2 pads / hour; 1 menstrual cup every 6 hours; small-medium blood clots (e.g., pea, grape, small  coin)   - SEVERE: soaking 2 or more pads/hour for 2 or more hours; 1 menstrual cup every 2 hours; bleeding not contained by pads or continuous red blood from vagina; large blood clots (e.g., golf ball, large coin)      1 over night in an our 5. MENSTRUAL HISTORY:  "When did this menstrual period begin?", "Is this a normal period for you?"       Not normal 6. LMP:  "When did your last menstrual period begin?"     Yesterday 7. OTHER SYMPTOMS: "Do you have any other symptoms?" (e.g., back pain, diarrhea, dizzy or lightheaded, fever, urination pain, vaginal discharge, vomiting)     Cold, sweating, chills 8. PREGNANCY: "Is there any chance you are pregnant?" (e.g., unprotected intercourse, missed birth control pill, broken condom)     unsure  Protocols used: Abdominal Pain - Menstrual Cramps-A-AH

## 2022-01-12 ENCOUNTER — Encounter: Payer: Managed Care, Other (non HMO) | Admitting: Obstetrics & Gynecology

## 2022-02-01 NOTE — Patient Instructions (Signed)

## 2022-02-03 ENCOUNTER — Telehealth: Payer: Self-pay | Admitting: Family Medicine

## 2022-02-03 ENCOUNTER — Encounter: Payer: Self-pay | Admitting: Family Medicine

## 2022-02-03 ENCOUNTER — Ambulatory Visit (INDEPENDENT_AMBULATORY_CARE_PROVIDER_SITE_OTHER): Payer: BC Managed Care – PPO | Admitting: Family Medicine

## 2022-02-03 DIAGNOSIS — Z13220 Encounter for screening for lipoid disorders: Secondary | ICD-10-CM

## 2022-02-03 DIAGNOSIS — Z0000 Encounter for general adult medical examination without abnormal findings: Secondary | ICD-10-CM

## 2022-02-03 DIAGNOSIS — Z23 Encounter for immunization: Secondary | ICD-10-CM

## 2022-02-03 DIAGNOSIS — Z91199 Patient's noncompliance with other medical treatment and regimen due to unspecified reason: Secondary | ICD-10-CM

## 2022-02-03 DIAGNOSIS — Z124 Encounter for screening for malignant neoplasm of cervix: Secondary | ICD-10-CM

## 2022-02-03 NOTE — Telephone Encounter (Signed)
LVM to check on patient due to missing CPE appt that was scheduled with our office for today, 02/03/22 with provider Delsa Grana.

## 2022-02-03 NOTE — Progress Notes (Signed)
No show for CPE  2nd no show, letter requested to be sent to pt and we will reach out and see if she's ok  Delsa Grana, PA-C

## 2022-02-14 ENCOUNTER — Encounter (INDEPENDENT_AMBULATORY_CARE_PROVIDER_SITE_OTHER): Payer: Self-pay

## 2022-02-23 ENCOUNTER — Emergency Department: Payer: Managed Care, Other (non HMO)

## 2022-02-23 ENCOUNTER — Emergency Department
Admission: EM | Admit: 2022-02-23 | Discharge: 2022-02-23 | Disposition: A | Discharge status: Home / Self Care | Payer: Managed Care, Other (non HMO) | Attending: Emergency Medicine | Admitting: Emergency Medicine

## 2022-02-23 ENCOUNTER — Other Ambulatory Visit: Payer: Self-pay

## 2022-02-23 DIAGNOSIS — J45909 Unspecified asthma, uncomplicated: Secondary | ICD-10-CM | POA: Diagnosis not present

## 2022-02-23 DIAGNOSIS — S300XXA Contusion of lower back and pelvis, initial encounter: Secondary | ICD-10-CM | POA: Insufficient documentation

## 2022-02-23 DIAGNOSIS — S9032XA Contusion of left foot, initial encounter: Secondary | ICD-10-CM | POA: Insufficient documentation

## 2022-02-23 DIAGNOSIS — S9031XA Contusion of right foot, initial encounter: Secondary | ICD-10-CM | POA: Diagnosis not present

## 2022-02-23 DIAGNOSIS — W109XXA Fall (on) (from) unspecified stairs and steps, initial encounter: Secondary | ICD-10-CM | POA: Diagnosis not present

## 2022-02-23 DIAGNOSIS — S1093XA Contusion of unspecified part of neck, initial encounter: Secondary | ICD-10-CM | POA: Diagnosis not present

## 2022-02-23 DIAGNOSIS — S20229A Contusion of unspecified back wall of thorax, initial encounter: Secondary | ICD-10-CM | POA: Diagnosis not present

## 2022-02-23 DIAGNOSIS — T07XXXA Unspecified multiple injuries, initial encounter: Secondary | ICD-10-CM

## 2022-02-23 DIAGNOSIS — S99921A Unspecified injury of right foot, initial encounter: Secondary | ICD-10-CM | POA: Diagnosis present

## 2022-02-23 DIAGNOSIS — W19XXXA Unspecified fall, initial encounter: Secondary | ICD-10-CM

## 2022-02-23 MED ORDER — MELOXICAM 15 MG PO TABS: 15.0000 mg | ORAL_TABLET | Freq: Every day | ORAL | 2 refills | Status: DC

## 2022-02-23 MED ORDER — BACLOFEN 10 MG PO TABS: 10.0000 mg | ORAL_TABLET | Freq: Three times a day (TID) | ORAL | 0 refills | Status: AC

## 2022-02-23 MED ORDER — OXYCODONE-ACETAMINOPHEN 5-325 MG PO TABS
1.0000 | ORAL_TABLET | Freq: Once | ORAL | Status: AC
  Administered 2022-02-23: 1 via ORAL
  Filled 2022-02-23: qty 1

## 2022-02-23 NOTE — ED Triage Notes (Signed)
Pt arrives with c/o fall down her steps. Pt c/o bilateral foot pain and neck pain.

## 2022-02-23 NOTE — ED Provider Notes (Signed)
[facility] Provider Note    Event Date/Time   First MD Initiated Contact with Patient 02/23/22 2134     (approximate)   History   Fall   HPI  Diane Williamson is a 31 y.o. female presents emergency department, patient has history of asthma, states she fell down about 6 steps earlier this morning.  States having pain in both feet and up her entire spine.  No LOC.  No head injury.      Physical Exam   Triage Vital Signs: ED Triage Vitals  Enc Vitals Group     BP 02/23/22 2126 126/88     Pulse Rate 02/23/22 2126 77     Resp 02/23/22 2126 16     Temp 02/23/22 2126 97.7 F (36.5 C)     Temp Source 02/23/22 2126 Oral     SpO2 02/23/22 2126 97 %     Weight 02/23/22 2123 260 lb (117.9 kg)     Height 02/23/22 2123 5\' 10"  (1.778 m)     Head Circumference --      Peak Flow --      Pain Score 02/23/22 2123 8     Pain Loc --      Pain Edu? --      Excl. in GC? --     Most recent vital signs: Vitals:   02/23/22 2126  BP: 126/88  Pulse: 77  Resp: 16  Temp: 97.7 F (36.5 C)  SpO2: 97%     General: Awake, no distress.   CV:  Good peripheral perfusion. regular rate and  rhythm Resp:  Normal effort.  Abd:  No distention.   Other:  Bruising noted left foot, tender to palpation, right foot is tender to palpation, entire spine is tender   ED Results / Procedures / Treatments   Labs (all labs ordered are listed, but only abnormal results are displayed) Labs Reviewed - No data to display   EKG     RADIOLOGY X-ray of the right and left foot, C-spine, T-spine and chest    PROCEDURES:   Procedures   MEDICATIONS ORDERED IN ED: Medications  oxyCODONE-acetaminophen (PERCOCET/ROXICET) 5-325 MG per tablet 1 tablet (has no administration in time range)     IMPRESSION / MDM / ASSESSMENT AND PLAN / ED COURSE  I reviewed the triage vital signs and the nursing notes.                              Differential diagnosis includes, but is  not limited to,   Patient's presentation is most consistent with acute complicated illness / injury requiring diagnostic workup.   Patient appears to be very stable.  She is ambulatory.  X-ray of the right foot, left foot, chest, thoracic spine and C-spine independently reviewed and interpreted by me as being negative for any acute abnormality.  Confirmed by radiology  2127 explain findings to the patient.  Gave her Percocet while here in the ED appears to be discharged with prescription for meloxicam and baclofen.  She is apply ice to all areas that hurt.  Follow-up with orthopedics if not improving in 1 week.  Return if worsening.      FINAL CLINICAL IMPRESSION(S) / ED DIAGNOSES   Final diagnoses:  Fall, initial encounter  Multiple contusions     Rx / DC Orders   ED Discharge Orders          Ordered  meloxicam (MOBIC) 15 MG tablet  Daily        02/23/22 2214    baclofen (LIORESAL) 10 MG tablet  3 times daily        02/23/22 2214             Note:  This document was prepared using Dragon voice recognition software and may include unintentional dictation errors.    Faythe Ghee, PA-C 02/23/22 2215    Georga Hacking, MD 02/23/22 [id number]

## 2022-03-14 ENCOUNTER — Other Ambulatory Visit: Payer: Self-pay | Admitting: Emergency Medicine

## 2022-03-14 DIAGNOSIS — F411 Generalized anxiety disorder: Secondary | ICD-10-CM

## 2022-03-14 DIAGNOSIS — F418 Other specified anxiety disorders: Secondary | ICD-10-CM

## 2022-03-14 DIAGNOSIS — Z3009 Encounter for other general counseling and advice on contraception: Secondary | ICD-10-CM

## 2022-03-14 DIAGNOSIS — F5104 Psychophysiologic insomnia: Secondary | ICD-10-CM

## 2022-03-15 MED ORDER — ETONOGESTREL-ETHINYL ESTRADIOL 0.12-0.015 MG/24HR VA RING: VAGINAL_RING | VAGINAL | 3 refills | Status: DC

## 2022-03-15 MED ORDER — SERTRALINE HCL 100 MG PO TABS: 200.0000 mg | ORAL_TABLET | Freq: Every day | ORAL | 3 refills | Status: DC

## 2022-03-30 ENCOUNTER — Telehealth: Payer: Self-pay | Admitting: Licensed Clinical Social Worker

## 2022-03-30 ENCOUNTER — Ambulatory Visit: Payer: Self-pay | Admitting: *Deleted

## 2022-03-30 ENCOUNTER — Ambulatory Visit (INDEPENDENT_AMBULATORY_CARE_PROVIDER_SITE_OTHER): Payer: Managed Care, Other (non HMO) | Admitting: Family Medicine

## 2022-03-30 ENCOUNTER — Encounter: Payer: Self-pay | Admitting: Family Medicine

## 2022-03-30 VITALS — BP 132/76 | HR 83 | Temp 97.7°F | Resp 16 | Ht 71.0 in

## 2022-03-30 DIAGNOSIS — F32A Depression, unspecified: Secondary | ICD-10-CM | POA: Diagnosis not present

## 2022-03-30 DIAGNOSIS — D649 Anemia, unspecified: Secondary | ICD-10-CM

## 2022-03-30 DIAGNOSIS — G2581 Restless legs syndrome: Secondary | ICD-10-CM

## 2022-03-30 DIAGNOSIS — G4700 Insomnia, unspecified: Secondary | ICD-10-CM

## 2022-03-30 DIAGNOSIS — Z5181 Encounter for therapeutic drug level monitoring: Secondary | ICD-10-CM

## 2022-03-30 DIAGNOSIS — F411 Generalized anxiety disorder: Secondary | ICD-10-CM | POA: Diagnosis not present

## 2022-03-30 DIAGNOSIS — E876 Hypokalemia: Secondary | ICD-10-CM

## 2022-03-30 DIAGNOSIS — E66813 Obesity, class 3: Secondary | ICD-10-CM | POA: Insufficient documentation

## 2022-03-30 MED ORDER — SERTRALINE HCL 100 MG PO TABS: 250.0000 mg | ORAL_TABLET | Freq: Every day | ORAL | 0 refills | Status: DC

## 2022-03-30 MED ORDER — BUSPIRONE HCL 5 MG PO TABS: 5.0000 mg | ORAL_TABLET | Freq: Three times a day (TID) | ORAL | 1 refills | Status: DC | PRN

## 2022-03-30 MED ORDER — PREGABALIN 25 MG PO CAPS: 25.0000 mg | ORAL_CAPSULE | Freq: Every evening | ORAL | 2 refills | Status: DC | PRN

## 2022-03-30 NOTE — Progress Notes (Signed)
Patient ID: Diane Williamson, female    DOB: 1991/01/24, 31 y.o.   MRN: [id number]  PCP: Danelle Berry, PA-C  Chief Complaint  Patient presents with   Follow-up   Anxiety    Past 2 weeks worst, prefers to discuss with provider    Subjective:   Diane Williamson is a 31 y.o. female, presents to clinic with CC of the following:  HPI   Increased anxiety with stress and depressive sx    03/30/2022    1:23 PM 06/10/2021    9:32 AM 02/01/2021    8:33 AM 01/01/2021    9:51 AM  GAD 7 : Generalized Anxiety Score  Nervous, Anxious, on Edge 3  0 0  Control/stop worrying 3  0 0  Worry too much - different things 3  0 0  Trouble relaxing 2  0 0  Restless 3  1 0  Easily annoyed or irritable 2  0 0  Afraid - awful might happen 1  0 0  Total GAD 7 Score 17  1 0  Anxiety Difficulty Very difficult  Not difficult at all Not difficult at all     Information is confidential and restricted. Go to Review Flowsheets to unlock data.      03/30/2022    1:16 PM 12/21/2021    2:51 PM 06/10/2021    9:28 AM  Depression screen PHQ 2/9  Decreased Interest 2 0   Down, Depressed, Hopeless 2 0   PHQ - 2 Score 4 0   Altered sleeping 3    Tired, decreased energy 3    Change in appetite 2    Feeling bad or failure about yourself  1    Trouble concentrating 2    Moving slowly or fidgety/restless 2    Suicidal thoughts 1    PHQ-9 Score 18    Difficult doing work/chores Very difficult       Information is confidential and restricted. Go to Review Flowsheets to unlock data.   She is not feeling quite like herself she endorses a lot of stressful things happening in her life, a lot of conflict at work and she reports that she comes home to an empty house and is alone and does not have any friends or family to talk to which is making her more depressed.  She is also having severe insomnia because of her moods and also because of severe restless leg syndrome. She was previously working with a therapist but  things were going so well that the last time she talked her was about June this past summer, she did reach out to her and she has an appointment on Friday.  She is having some passive suicidal ideations but she denies any plan for suicide or any self-harm.  She would like to try adjusting medications.  Currently on sertraline 200 mg and for a long time it has worked really well for her.  She has previously been seeing Raynelle Fanning NP for most of her care I last saw her about  2 years ago, she andJulie have tried multiple medications for her depression and anxiety and insomnia - she reports insomnia meds didn't help her sleep  She cannot tolerate iron with her hx of mild IDA Not current RLS meds on list    Patient Active Problem List   Diagnosis Date Noted   Generalized anxiety disorder 12/30/2019   Depression with anxiety 12/30/2019   Chronic bilateral low back pain without sciatica 12/30/2019  Psychophysiological insomnia 12/30/2019   Carpal tunnel syndrome of right wrist 11/16/2019   Dysplasia of cervix, low grade (CIN 1) 07/04/2019   Chronic venous insufficiency 09/03/2018   Eczema 07/03/2018   Allergic rhinitis 07/03/2018   GERD (gastroesophageal reflux disease) 07/03/2018   Vitamin D deficiency 07/03/2018   Obesity (BMI 35.0-39.9 without comorbidity) 06/07/2018   Anemia 06/07/2018   Hematuria, microscopic 06/07/2018   ADHD (attention deficit hyperactivity disorder), 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/2018   Varicose veins of both lower extremities with pain 06/07/2018   Adjustment disorder with mixed anxiety and depressed mood 10/10/2017   Personal history of nonsuicidal self-harm 08/29/2017   Vitiligo 12/08/2010      Current Outpatient Medications:    acyclovir (ZOVIRAX) 800 MG tablet, TAKE 1 TABLET BY MOUTH THREE TIMES DAILY FOR 2 DAYS WITH  OUTBREAKS,  ONE  DAILY  FOR  SUPPRESSION, Disp: 38 tablet, Rfl: 0   etonogestrel-ethinyl estradiol (NUVARING) 0.12-0.015 MG/24HR vaginal ring, Insert  vaginally and leave in place for 3 consecutive weeks, then remove for 1 week., Disp: 3 each, Rfl: 3   ipratropium-albuterol (DUONEB) 0.5-2.5 (3) MG/3ML SOLN, Take 3 mLs by nebulization every 4 (four) hours as needed., Disp: 360 mL, Rfl: 0   meloxicam (MOBIC) 15 MG tablet, Take 1 tablet (15 mg total) by mouth daily., Disp: 30 tablet, Rfl: 2   sertraline (ZOLOFT) 100 MG tablet, Take 2 tablets (200 mg total) by mouth daily., Disp: 180 tablet, Rfl: 3   VENTOLIN HFA 108 (90 Base) MCG/ACT inhaler, INHALE 1 TO 2 PUFFS BY MOUTH EVERY 4 HOURS AS NEEDED FOR WHEEZING AND FOR SHORTNESS OF BREATH, Disp: 18 g, Rfl: 3   zolpidem (AMBIEN) 5 MG tablet, Take 1 tablet (5 mg total) by mouth at bedtime as needed for sleep., Disp: 15 tablet, Rfl: 1   Allergies  Allergen Reactions   Benadryl Allergy [Diphenhydramine Hcl]     Rapid heart rate.     Social History   Tobacco Use   Smoking status: Former    Packs/day: 0.50    Years: 7.00    Total pack years: 3.50    Types: Cigarettes    Quit date: 01/17/2016    Years since quitting: 6.2   Smokeless tobacco: Never  Vaping Use   Vaping Use: Never used  Substance Use Topics   Alcohol use: Yes    Alcohol/week: 2.0 standard drinks of alcohol    Types: 2 Glasses of wine per week   Drug use: No      Chart Review Today: I personally reviewed active problem list, medication list, allergies, family history, social history, health maintenance, notes from last encounter, lab results, imaging with the patient/caregiver today.   Review of Systems  Constitutional: Negative.   HENT: Negative.    Eyes: Negative.   Respiratory: Negative.    Cardiovascular: Negative.   Gastrointestinal: Negative.   Endocrine: Negative.   Genitourinary: Negative.   Musculoskeletal: Negative.   Skin: Negative.   Allergic/Immunologic: Negative.   Neurological: Negative.   Hematological: Negative.   Psychiatric/Behavioral: Negative.    All other systems reviewed and are  negative.      Objective:   Vitals:   03/30/22 1321  BP: 132/76  Pulse: 83  Resp: 16  Temp: 97.7 F (36.5 C)  TempSrc: Oral  SpO2: 98%  Height: 5\' 11"  (1.803 m)    Body mass index is 36.26 kg/m.  Physical Exam Vitals and nursing note reviewed.  Constitutional:      Appearance: She is well-developed.  HENT:     Head: Normocephalic and atraumatic.     Nose: Nose normal.  Eyes:     General:        Right eye: No discharge.        Left eye: No discharge.     Conjunctiva/sclera: Conjunctivae normal.  Neck:     Trachea: No tracheal deviation.  Cardiovascular:     Rate and Rhythm: Normal rate and regular rhythm.  Pulmonary:     Effort: Pulmonary effort is normal. No respiratory distress.     Breath sounds: No stridor.  Musculoskeletal:        General: Normal range of motion.  Skin:    General: Skin is warm and dry.     Findings: No rash.  Neurological:     Mental Status: She is alert.     Motor: No abnormal muscle tone.     Coordination: Coordination normal.  Psychiatric:        Attention and Perception: Attention and perception normal.        Mood and Affect: Mood is depressed. Mood is not anxious. Affect is not tearful.        Speech: Speech normal.        Behavior: Behavior normal. Behavior is cooperative.        Thought Content: Thought content does not include homicidal ideation. Suicidal: passive SI.Thought content does not include homicidal or suicidal plan.      Results for orders placed or performed during the hospital encounter of 12/21/21  Comprehensive metabolic panel  Result Value Ref Range   Sodium 140 135 - 145 mmol/L   Potassium 3.2 (L) 3.5 - 5.1 mmol/L   Chloride 110 98 - 111 mmol/L   CO2 22 22 - 32 mmol/L   Glucose, Bld 112 (H) 70 - 99 mg/dL   BUN 11 6 - 20 mg/dL   Creatinine, Ser 1.610.66 0.44 - 1.00 mg/dL   Calcium 8.8 (L) 8.9 - 10.3 mg/dL   Total Protein 7.3 6.5 - 8.1 g/dL   Albumin 3.5 3.5 - 5.0 g/dL   AST 16 15 - 41 U/L   ALT 10 0 - 44  U/L   Alkaline Phosphatase 69 38 - 126 U/L   Total Bilirubin 0.5 0.3 - 1.2 mg/dL   GFR, Estimated >09>60 >60>60 mL/min   Anion gap 8 5 - 15  TSH  Result Value Ref Range   TSH 3.200 0.350 - 4.500 uIU/mL  Magnesium  Result Value Ref Range   Magnesium 2.0 1.7 - 2.4 mg/dL  CBC with Differential/Platelet  Result Value Ref Range   WBC 8.5 4.0 - 10.5 K/uL   RBC 4.55 3.87 - 5.11 MIL/uL   Hemoglobin 11.2 (L) 12.0 - 15.0 g/dL   HCT 45.435.5 (L) 09.836.0 - 11.946.0 %   MCV 78.0 (L) 80.0 - 100.0 fL   MCH 24.6 (L) 26.0 - 34.0 pg   MCHC 31.5 30.0 - 36.0 g/dL   RDW 14.715.2 82.911.5 - 56.215.5 %   Platelets 416 (H) 150 - 400 K/uL   nRBC 0.0 0.0 - 0.2 %   Neutrophils Relative % 46 %   Neutro Abs 3.9 1.7 - 7.7 K/uL   Lymphocytes Relative 46 %   Lymphs Abs 3.9 0.7 - 4.0 K/uL   Monocytes Relative 5 %   Monocytes Absolute 0.4 0.1 - 1.0 K/uL   Eosinophils Relative 3 %   Eosinophils Absolute 0.3 0.0 - 0.5 K/uL   Basophils Relative 0 %   Basophils  Absolute 0.0 0.0 - 0.1 K/uL   Immature Granulocytes 0 %   Abs Immature Granulocytes 0.02 0.00 - 0.07 K/uL       Assessment & Plan:    1. Generalized anxiety disorder Significantly worse anxiety sx with multiple new and recent stressors Previously well controlled on zoloft 200 mg and working with therapist Dose increase of zoloft and very close f/up She is also talking to her therapist this week Multiple psych, hotline, mobile crisis resources given today She previously was on buspar as well, she can try this prn for stress/anxiety sx - TSH - sertraline (ZOLOFT) 100 MG tablet; Take 2.5 tablets (250 mg total) by mouth daily.  Dispense: 225 tablet; Refill: 0 - Ambulatory referral to Psychiatry  2. Depression, unspecified depression type New and severe depressive sx - lonely, down, feels guilt, shame, like a failure -she reports doing really well about 6 months ago and recently multiple stressors and problems have occurred she expresses guilt and shame despite having stopped  smoking, vaping, using alcohol to self medicate, stop marijuana, she is been promoted at work, she is feeling lonely.   We discussed working with her therapist, increasing Zoloft, possibly switching to an SNRI?  Could add Wellbutrin to her current SSRI She is experiencing some passive suicidal ideations but denies a plan commit suicide she has remote history of self-harm but she is not been harming herself at all.  She is having difficulty sleeping for example get a 12-hour shift last night and completed that at 7 AM this morning and this afternoon she is still not been able to go to sleep she is having episodes of crying even though she does not know why Who adjusted her medication, given multiple resources, she will connect with her therapist in the next 2 days and I will follow-up with her closely next week - TSH - Ambulatory referral to Psychiatry  3. Insomnia, unspecified type She endorses restless leg syndrome is causing most of her insomnia previously failed insomnia medications I can see Ambien on her chart before - TSH - Ambulatory referral to Psychiatry  4. RLS (restless legs syndrome) Likely needs to address iron deficiency anemia I do not believe that she is tried medications for this before, Lyrica send 10 - CBC with Differential/Platelet - TSH - Iron, TIBC and Ferritin Panel - pregabalin (LYRICA) 25 MG capsule; Take 1 capsule (25 mg total) by mouth at bedtime as needed (for restless leg syndrome symptoms).  Dispense: 30 capsule; Refill: 2  5. Anemia, unspecified type Recheck labs - CBC with Differential/Platelet - Iron, TIBC and Ferritin Panel  6. Hypokalemia Recheck labs - COMPLETE METABOLIC PANEL WITH GFR  7. Encounter for medication monitoring  - CBC with Differential/Platelet - COMPLETE METABOLIC PANEL WITH GFR - TSH - Iron, TIBC and Ferritin Panel   Return for close f/up on med/mood next wed-fri.     Danelle Berry, PA-C 03/30/22 1:28 PM

## 2022-03-30 NOTE — Telephone Encounter (Signed)
LCSW referred patient to [facility].

## 2022-03-30 NOTE — Patient Instructions (Addendum)
I will send in an additional med to try for your restless legs - try lyrica when going to bed - there are a lot of other medications and doses to try - but I think this will be the safest for now and we can adjust the dose.  You do need to treat underlying iron deficiency - even if its with hematology   I will let you know about lab results  Try 250 mg zoloft and buspar as needed up to max 20 mg three times a day (60 mg total in 24 hours)  Here are some resources to help you if you feel you are in a mental health crisis:  National Suicide Prevention Lifeline - Call 303 872 2573  for help - Website with more resources: ARanked.fi  [employer] - Call 337-600-5142 for help. - Mobile Crisis Program available 24 hours a day, 365 days a year. - Available for anyone of any age in [place] & [place] counties.  [facility] [employer] - Address: [address], [place] - Telephone: [phone]  - Hours of Operation: Sunday - Saturday - 8:00 a.m. - 8:00 p.m. - Medicaid, Medicare (Government Issued Only), BCBS, and Cash - Pay - Crisis Management, Outpatient Individual & Group Therapy, Psychiatrists on-site to provide medication management, In-Home Psychiatric Care, and Peer Support Care.  National Mobile Crisis: 1-800-939-5911 - Mobile Crisis Program available 24 hours a day, 365 days a year. - Available for anyone of any age in [place] & [place]    Other Psychiatric Services:  Dr. Alex Eksir - [phone] [facility] ([place]) - [phone] [facility] ([place]) - [phone] Dr. Rupinder Kaur ([place]) - [phone] [facility] ([place]) - [phone] [facility] ([place] & [place]) - [phone] [facility] ([place]) - [phone] [facility], [address], [place], [place]  [phone]  RESOURCE GUIDE:       Behavioral Health Resources in the Community  Intensive Outpatient Programs: [place] [facility]      [address] [place], [place] [phone] Both a day and evening program       [facility]     [address]        [place], [place] [phone]         ADS: [facility] [address] [place] [phone]  [facility] ACCESS LINE: [phone] or [phone] [address] [place], [place] [address]  Psychological Services: [facility]  [phone] [facility]  [phone] [facility], [phone] [address], [place], ACCESS LINE: [phone] or [phone], [phone]  Mobile Crisis Teams:                                        [facility]         Mobile Crisis Care Unit [phone]             [facility] [address] [phone]

## 2022-03-30 NOTE — Telephone Encounter (Signed)
  Chief Complaint: anxiety Symptoms: patient is having increased anxiety- thoughts of self harm coming back (hx cutting) Frequency: 2 weeks Pertinent Negatives: Patient denies suicidal thought now- but has been having some Disposition: [] ED /[] Urgent Care (no appt availability in office) / [x] Appointment(In office/virtual)/ []  [place] [place] [facility]/ [] Home Care/ [] Refused Recommended Disposition /[]  Mobile Bus/ []  Follow-up with PCP Additional Notes: Patient has reached out to therapist- does have appointment Friday. Advised [facility] but they are full(per patient). Appointment scheduled  at PCP- patient advised of other resources- [facility], ED

## 2022-03-30 NOTE — Telephone Encounter (Signed)
Reason for Disposition  [1] SEVERE anxiety (e.g., extremely anxious with intense emotional symptoms such as feeling of unreality, urge to flee, unable to calm down; unable to cope or function) AND [2] not better after 10 minutes of reassurance and Care Advice  Answer Assessment - Initial Assessment Questions 1. CONCERN: "Did anything happen that prompted you to call today?"      Over whelmed 2. ANXIETY SYMPTOMS: "Can you describe how you (your loved one; patient) have been feeling?" (e.g., tense, restless, panicky, anxious, keyed up, overwhelmed, sense of impending doom).      Patient has had life changes 3. ONSET: "How long have you been feeling this way?" (e.g., hours, days, weeks)     2 weeks 4. SEVERITY: "How would you rate the level of anxiety?" (e.g., 0 - 10; or mild, moderate, severe).     severe 5. FUNCTIONAL IMPAIRMENT: "How have these feelings affected your ability to do daily activities?" "Have you had more difficulty than usual doing your normal daily activities?" (e.g., getting better, same, worse; self-care, school, work, interactions)     Patient is coping with work- it's hard 6. HISTORY: "Have you felt this way before?" "Have you ever been diagnosed with an anxiety problem in the past?" (e.g., generalized anxiety disorder, panic attacks, PTSD). If Yes, ask: "How was this problem treated?" (e.g., medicines, counseling, etc.)     Hx self harm- starting to have those 7. RISK OF HARM - SUICIDAL IDEATION: "Do you ever have thoughts of hurting or killing yourself?" If Yes, ask:  "Do you have these feelings now?" "Do you have a plan on how you would do this?"     In past 2 weeks- thought, has thought of plans 8. TREATMENT:  "What has been done so far to treat this anxiety?" (e.g., medicines, relaxation strategies). "What has helped?"     Medication and antiinflammatory  9. TREATMENT - THERAPIST: "Do you have a counselor or therapist? Name?"     Has appointment on Friday 10. POTENTIAL  TRIGGERS: "Do you drink caffeinated beverages (e.g., coffee, colas, teas), and how much daily?" "Do you drink alcohol or use any drugs?" "Have you started any new medicines recently?"       *No Answer* 11. PATIENT SUPPORT: "Who is with you now?" "Who do you live with?" "Do you have family or friends who you can talk to?"        parents 67. OTHER SYMPTOMS: "Do you have any other symptoms?" (e.g., feeling depressed, trouble concentrating, trouble sleeping, trouble breathing, palpitations or fast heartbeat, chest pain, sweating, nausea, or diarrhea)       *No Answer* 13. PREGNANCY: "Is there any chance you are pregnant?" "When was your last menstrual period?"       *No Answer*  Protocols used: Anxiety and Panic Attack-A-AH

## 2022-03-31 LAB — COMPLETE METABOLIC PANEL WITH GFR
AG Ratio: 1.6 (calc) (ref 1.0–2.5)
ALT: 11 U/L (ref 6–29)
AST: 12 U/L (ref 10–30)
Albumin: 4.5 g/dL (ref 3.6–5.1)
Alkaline phosphatase (APISO): 62 U/L (ref 31–125)
BUN/Creatinine Ratio: 10 (calc) (ref 6–22)
BUN: 6 mg/dL — ABNORMAL LOW (ref 7–25)
CO2: 22 mmol/L (ref 20–32)
Calcium: 9.4 mg/dL (ref 8.6–10.2)
Chloride: 105 mmol/L (ref 98–110)
Creat: 0.62 mg/dL (ref 0.50–0.97)
Globulin: 2.8 g/dL (calc) (ref 1.9–3.7)
Glucose, Bld: 90 mg/dL (ref 65–99)
Potassium: 3.5 mmol/L (ref 3.5–5.3)
Sodium: 138 mmol/L (ref 135–146)
Total Bilirubin: 0.5 mg/dL (ref 0.2–1.2)
Total Protein: 7.3 g/dL (ref 6.1–8.1)
eGFR: 122 mL/min/{1.73_m2} (ref >=60)

## 2022-03-31 LAB — CBC WITH DIFFERENTIAL/PLATELET
Absolute Monocytes: 416 cells/uL (ref 200–950)
Basophils Absolute: 31 cells/uL (ref 0–200)
Basophils Relative: 0.4 %
Eosinophils Absolute: 100 cells/uL (ref 15–500)
Eosinophils Relative: 1.3 %
HCT: 33.9 % — ABNORMAL LOW (ref 35.0–45.0)
Hemoglobin: 11.1 g/dL — ABNORMAL LOW (ref 11.7–15.5)
Lymphs Abs: 3542 cells/uL (ref 850–3900)
MCH: 26.2 pg — ABNORMAL LOW (ref 27.0–33.0)
MCHC: 32.7 g/dL (ref 32.0–36.0)
MCV: 80.1 fL (ref 80.0–100.0)
MPV: 10.2 fL (ref 7.5–12.5)
Monocytes Relative: 5.4 %
Neutro Abs: 3611 cells/uL (ref 1500–7800)
Neutrophils Relative %: 46.9 %
Platelets: 341 10*3/uL (ref 140–400)
RBC: 4.23 10*6/uL (ref 3.80–5.10)
RDW: 14.8 % (ref 11.0–15.0)
Total Lymphocyte: 46 %
WBC: 7.7 10*3/uL (ref 3.8–10.8)

## 2022-03-31 LAB — IRON,TIBC AND FERRITIN PANEL
%SAT: 12 % (calc) — ABNORMAL LOW (ref 16–45)
Ferritin: 4 ng/mL — ABNORMAL LOW (ref 16–154)
Iron: 52 ug/dL (ref 40–190)
TIBC: 426 mcg/dL (calc) (ref 250–450)

## 2022-03-31 LAB — TSH: TSH: 2.08 mIU/L

## 2022-04-06 DIAGNOSIS — F331 Major depressive disorder, recurrent, moderate: Secondary | ICD-10-CM | POA: Insufficient documentation

## 2022-04-06 NOTE — Progress Notes (Deleted)
There were no vitals taken for this visit.   Subjective:    Patient ID: Diane Williamson, female    DOB: 30-Jun-1990, 30 y.o.   MRN: [id number]  HPI: Diane Williamson is a 31 y.o. female  No chief complaint on file.  Depression/anxiety:patient was recently seen by Delsa Grana PA on 03/30/2022. Patient reported that she was not feeling quite like herself and that she has had a lot of stress due to conflict at work and coming hoe to an empty house.  She has also had sever insomnia.  She was previously working with a therapist but things were going well the last time she talked to her was this past summer.  She did reach out to her to get an appointment.  She also reported to [facility] that she was having passive suicidal ideations but denied any plan for suicide.  Patient was taking sertraline 200 mg daily.  Her medications were adjusted to sertraline 250 mg daily.  A referral was also placed to psychiatry. Patient reports ***.       03/30/2022    1:16 PM 12/21/2021    2:51 PM 06/10/2021    9:28 AM 03/25/2021    8:41 AM 03/08/2021    8:30 AM  Depression screen PHQ 2/9  Decreased Interest 2 0  0 0  Down, Depressed, Hopeless 2 0  0 0  PHQ - 2 Score 4 0  0 0  Altered sleeping 3   0 0  Tired, decreased energy 3   0 0  Change in appetite 2   0 0  Feeling bad or failure about yourself  1   0 0  Trouble concentrating 2   0 0  Moving slowly or fidgety/restless 2   0 0  Suicidal thoughts 1   0 0  PHQ-9 Score 18   0 0  Difficult doing work/chores Very difficult   Not difficult at all Not difficult at all     Information is confidential and restricted. Go to Review Flowsheets to unlock data.       03/30/2022    1:23 PM 06/10/2021    9:32 AM 02/01/2021    8:33 AM 01/01/2021    9:51 AM  GAD 7 : Generalized Anxiety Score  Nervous, Anxious, on Edge 3  0 0  Control/stop worrying 3  0 0  Worry too much - different things 3  0 0  Trouble relaxing 2  0 0  Restless 3  1 0  Easily annoyed or irritable 2   0 0  Afraid - awful might happen 1  0 0  Total GAD 7 Score 17  1 0  Anxiety Difficulty Very difficult  Not difficult at all Not difficult at all     Information is confidential and restricted. Go to Review Flowsheets to unlock data.      Relevant past medical, surgical, family and social history reviewed and updated as indicated. Interim medical history since our last visit reviewed. Allergies and medications reviewed and updated.  Review of Systems  Constitutional: Negative for fever or weight change.  Respiratory: Negative for cough and shortness of breath.   Cardiovascular: Negative for chest pain or palpitations.  Gastrointestinal: Negative for abdominal pain, no bowel changes.  Musculoskeletal: Negative for gait problem or joint swelling.  Skin: Negative for rash.  Neurological: Negative for dizziness or headache.  No other specific complaints in a complete review of systems (except as listed in HPI above).  Objective:    There were no vitals taken for this visit.  Wt Readings from Last 3 Encounters:  02/23/22 260 lb (117.9 kg)  12/21/21 (!) 306 lb 1.6 oz (138.8 kg)  03/08/21 (!) 310 lb (140.6 kg)    Physical Exam  Constitutional: Patient appears well-developed and well-nourished. Obese *** No distress.  HEENT: head atraumatic, normocephalic, pupils equal and reactive to light, ears ***, neck supple, throat within normal limits Cardiovascular: Normal rate, regular rhythm and normal heart sounds.  No murmur heard. No BLE edema. Pulmonary/Chest: Effort normal and breath sounds normal. No respiratory distress. Abdominal: Soft.  There is no tenderness. Psychiatric: Patient has a normal mood and affect. behavior is normal. Judgment and thought content normal.  Results for orders placed or performed in visit on 03/30/22  CBC with Differential/Platelet  Result Value Ref Range   WBC 7.7 3.8 - 10.8 Thousand/uL   RBC 4.23 3.80 - 5.10 Million/uL   Hemoglobin 11.1 (L) 11.7  - 15.5 g/dL   HCT 33.9 (L) 35.0 - 45.0 %   MCV 80.1 80.0 - 100.0 fL   MCH 26.2 (L) 27.0 - 33.0 pg   MCHC 32.7 32.0 - 36.0 g/dL   RDW 14.8 11.0 - 15.0 %   Platelets 341 140 - 400 Thousand/uL   MPV 10.2 7.5 - 12.5 fL   Neutro Abs 3,611 1,500 - 7,800 cells/uL   Lymphs Abs 3,542 850 - 3,900 cells/uL   Absolute Monocytes 416 200 - 950 cells/uL   Eosinophils Absolute 100 15 - 500 cells/uL   Basophils Absolute 31 0 - 200 cells/uL   Neutrophils Relative % 46.9 %   Total Lymphocyte 46.0 %   Monocytes Relative 5.4 %   Eosinophils Relative 1.3 %   Basophils Relative 0.4 %  COMPLETE METABOLIC PANEL WITH GFR  Result Value Ref Range   Glucose, Bld 90 65 - 99 mg/dL   BUN 6 (L) 7 - 25 mg/dL   Creat 0.62 0.50 - 0.97 mg/dL   eGFR 122 > OR = 60 mL/min/1.8m   BUN/Creatinine Ratio 10 6 - 22 (calc)   Sodium 138 135 - 146 mmol/L   Potassium 3.5 3.5 - 5.3 mmol/L   Chloride 105 98 - 110 mmol/L   CO2 22 20 - 32 mmol/L   Calcium 9.4 8.6 - 10.2 mg/dL   Total Protein 7.3 6.1 - 8.1 g/dL   Albumin 4.5 3.6 - 5.1 g/dL   Globulin 2.8 1.9 - 3.7 g/dL (calc)   AG Ratio 1.6 1.0 - 2.5 (calc)   Total Bilirubin 0.5 0.2 - 1.2 mg/dL   Alkaline phosphatase (APISO) 62 31 - 125 U/L   AST 12 10 - 30 U/L   ALT 11 6 - 29 U/L  TSH  Result Value Ref Range   TSH 2.08 mIU/L  Iron, TIBC and Ferritin Panel  Result Value Ref Range   Iron 52 40 - 190 mcg/dL   TIBC 426 250 - 450 mcg/dL (calc)   %SAT 12 (L) 16 - 45 % (calc)   Ferritin 4 (L) 16 - 154 ng/mL      Assessment & Plan:   Problem List Items Addressed This Visit   None    Follow up plan: No follow-ups on file.

## 2022-04-07 ENCOUNTER — Ambulatory Visit: Payer: Managed Care, Other (non HMO) | Admitting: Nurse Practitioner

## 2022-04-07 DIAGNOSIS — F331 Major depressive disorder, recurrent, moderate: Secondary | ICD-10-CM

## 2022-04-07 DIAGNOSIS — F411 Generalized anxiety disorder: Secondary | ICD-10-CM

## 2022-05-13 NOTE — Progress Notes (Deleted)
Annual Physical Exam Name: Diane Williamson   MRN: [id number]    DOB: [birth year]   Date:05/13/2022 Today's Provider: Talitha Givens, MHS, PA-C Introduced myself to the patient as a PA-C and provided education on APPs in clinical practice.         Subjective  Chief Complaint  No chief complaint on file.   HPI  Patient presents for annual CPE.  Diet: *** Exercise: ***  Sleep: *** Last dental exam:*** Last eye exam: ***  Flowsheet Row Office Visit from 12/21/2021 in [facility] [place]  AUDIT-C Score 0      Depression: Phq 9 is  {Desc; negative/positive:13464}    03/30/2022    1:16 PM 12/21/2021    2:51 PM 06/10/2021    9:28 AM 03/25/2021    8:41 AM 03/08/2021    8:30 AM  Depression screen PHQ 2/9  Decreased Interest 2 0  0 0  Down, Depressed, Hopeless 2 0  0 0  PHQ - 2 Score 4 0  0 0  Altered sleeping 3   0 0  Tired, decreased energy 3   0 0  Change in appetite 2   0 0  Feeling bad or failure about yourself  1   0 0  Trouble concentrating 2   0 0  Moving slowly or fidgety/restless 2   0 0  Suicidal thoughts 1   0 0  PHQ-9 Score 18   0 0  Difficult doing work/chores Very difficult   Not difficult at all Not difficult at all     Information is confidential and restricted. Go to Review Flowsheets to unlock data.   Hypertension: BP Readings from Last 3 Encounters:  03/30/22 132/76  02/23/22 124/72  12/21/21 128/82   Obesity: Wt Readings from Last 3 Encounters:  02/23/22 260 lb (117.9 kg)  12/21/21 (!) 306 lb 1.6 oz (138.8 kg)  03/08/21 (!) 310 lb (140.6 kg)   BMI Readings from Last 3 Encounters:  03/30/22 36.26 kg/m  02/23/22 37.31 kg/m  12/21/21 42.69 kg/m     Vaccines:  HPV: up to at age 36 , ask insurance if age between 44-45  Shingrix: 61-64 yo and ask insurance if covered when patient above 1 yo Pneumonia:  educated and discussed with patient. Flu:  educated and discussed with patient.  Hep C Screening: 2/10/20219 STD  testing and prevention (HIV/chl/gon/syphilis): 12/31/2018 Intimate partner violence:*** Sexual History : Menstrual History/LMP/Abnormal Bleeding:  Incontinence Symptoms:   Breast cancer:  - Last Mammogram: no concern, does not qualify - BRCA gene screening: none  Osteoporosis: Discussed high calcium and vitamin D supplementation, weight bearing exercises  Cervical cancer screening: 06/26/2019, due  Skin cancer: Discussed monitoring for atypical lesions  Colorectal cancer: no concerns, does not qualify   Lung cancer:   Low Dose CT Chest recommended if Age 70-80 years, 20 pack-year currently smoking OR have quit w/in 15years. Patient does not qualify.   ECG: none  Advanced Care Planning: A voluntary discussion about advance care planning including the explanation and discussion of advance directives.  Discussed health care proxy and Living will, and the patient was able to identify a health care proxy as ***.  Patient {DOES_DOES [id number] have a living will at present time. If patient does have living will, I have requested they bring this to the clinic to be scanned in to their chart.  Lipids: Lab Results  Component Value Date   CHOL 202 (H) 02/01/2021  CHOL 162 06/07/2018   Lab Results  Component Value Date   HDL 48 (L) 02/01/2021   HDL 48 (L) 06/07/2018   Lab Results  Component Value Date   LDLCALC 122 (H) 02/01/2021   LDLCALC 91 06/07/2018   Lab Results  Component Value Date   TRIG 204 (H) 02/01/2021   TRIG 135 06/07/2018   Lab Results  Component Value Date   CHOLHDL 4.2 02/01/2021   CHOLHDL 3.4 06/07/2018   No results found for: "LDLDIRECT"  Glucose: Glucose  Date Value Ref Range Status  08/05/2013 90 65 - 99 mg/dL Final   Glucose, Bld  Date Value Ref Range Status  03/30/2022 90 65 - 99 mg/dL Final    Comment:    .            Fasting reference interval .   12/21/2021 112 (H) 70 - 99 mg/dL Final    Comment:    Glucose reference range applies only to  samples taken after fasting for at least 8 hours.  02/01/2021 88 65 - 99 mg/dL Final    Comment:    .            Fasting reference interval .     Patient Active Problem List   Diagnosis Date Noted   Moderate episode of recurrent major depressive disorder ([place]) 04/06/2022   Obesity, Class III, BMI 40-49.9 (morbid obesity) ([place]) 03/30/2022   Generalized anxiety disorder 12/30/2019   Depression with anxiety 12/30/2019   Chronic bilateral low back pain without sciatica 12/30/2019   Psychophysiological insomnia 12/30/2019   Carpal tunnel syndrome of right wrist 11/16/2019   Dysplasia of cervix, low grade (CIN 1) 07/04/2019   Chronic venous insufficiency 09/03/2018   Eczema 07/03/2018   Allergic rhinitis 07/03/2018   GERD (gastroesophageal reflux disease) 07/03/2018   Vitamin D deficiency 07/03/2018   Obesity (BMI 35.0-39.9 without comorbidity) 06/07/2018   Anemia 06/07/2018   Hematuria, microscopic 06/07/2018   ADHD (attention deficit hyperactivity disorder), 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/2018   Varicose veins of both lower extremities with pain 06/07/2018   Adjustment disorder with mixed anxiety and depressed mood 10/10/2017   Personal history of nonsuicidal self-harm 08/29/2017   Vitiligo 12/08/2010    Past Surgical History:  Procedure Laterality Date   NO PAST SURGERIES      Family History  Problem Relation Age of Onset   Heart disease Mother    Hypertension Mother    ADD / ADHD Father    Hypertension Father    Cancer Maternal Grandmother    Heart disease Maternal Grandmother    Diabetes Maternal Grandmother    Ovarian cancer Maternal Grandmother    Diabetes Maternal Grandfather    Diabetes Paternal Grandmother    Atrial fibrillation Paternal Grandmother    AAA (abdominal aortic aneurysm) Paternal Grandmother    Heart disease Paternal Grandmother    Heart attack Paternal Grandfather    Varicose Veins Paternal Grandfather    Breast cancer Neg Hx     Social History    Socioeconomic History   Marital status: Divorced    Spouse name: Not on file   Number of children: Not on file   Years of education: 12   Highest education level: Some college, no degree  Occupational History   Not on file  Tobacco Use   Smoking status: Former    Packs/day: 0.50    Years: 7.00    Total pack years: 3.50    Types: Cigarettes    Quit date:  01/17/2016    Years since quitting: 6.3   Smokeless tobacco: Never  Vaping Use   Vaping Use: Never used  Substance and Sexual Activity   Alcohol use: Yes    Alcohol/week: 2.0 standard drinks of alcohol    Types: 2 Glasses of wine per week   Drug use: No   Sexual activity: Yes    Partners: Male    Birth control/protection: Inserts  Other Topics Concern   Not on file  Social History Narrative   Not on file   Social Determinants of Health   Financial Resource Strain: Low Risk  (02/01/2021)   Overall Financial Resource Strain (CARDIA)    Difficulty of Paying Living Expenses: Not hard at all  Food Insecurity: No Food Insecurity (02/01/2021)   Hunger Vital Sign    Worried About Running Out of Food in the Last Year: Never true    Ran Out of Food in the Last Year: Never true  Transportation Needs: No Transportation Needs (02/01/2021)   PRAPARE - Hydrologist (Medical): No    Lack of Transportation (Non-Medical): No  Physical Activity: Insufficiently Active (02/01/2021)   Exercise Vital Sign    Days of Exercise per Week: 2 days    Minutes of Exercise per Session: 20 min  Stress: Stress Concern Present (02/01/2021)   [place]    Feeling of Stress : To some extent  Social Connections: Moderately Integrated (02/01/2021)   Social Connection and Isolation Panel [NHANES]    Frequency of Communication with Friends and Family: More than three times a week    Frequency of Social Gatherings with Friends and Family: Twice a week     Attends Religious Services: More than 4 times per year    Active Member of [employer] or Organizations: Yes    Attends Music therapist: More than 4 times per year    Marital Status: Divorced  Intimate Partner Violence: Not At Risk (02/01/2021)   Humiliation, Afraid, Rape, and Kick questionnaire    Fear of Current or Ex-Partner: No    Emotionally Abused: No    Physically Abused: No    Sexually Abused: No     Current Outpatient Medications:    acyclovir (ZOVIRAX) 800 MG tablet, TAKE 1 TABLET BY MOUTH THREE TIMES DAILY FOR 2 DAYS WITH  OUTBREAKS,  ONE  DAILY  FOR  SUPPRESSION, Disp: 38 tablet, Rfl: 0   busPIRone (BUSPAR) 5 MG tablet, Take 1-4 tablets (5-20 mg total) by mouth 3 (three) times daily as needed., Disp: 90 tablet, Rfl: 1   etonogestrel-ethinyl estradiol (NUVARING) 0.12-0.015 MG/24HR vaginal ring, Insert vaginally and leave in place for 3 consecutive weeks, then remove for 1 week., Disp: 3 each, Rfl: 3   ipratropium-albuterol (DUONEB) 0.5-2.5 (3) MG/3ML SOLN, Take 3 mLs by nebulization every 4 (four) hours as needed., Disp: 360 mL, Rfl: 0   meloxicam (MOBIC) 15 MG tablet, Take 1 tablet (15 mg total) by mouth daily., Disp: 30 tablet, Rfl: 2   pregabalin (LYRICA) 25 MG capsule, Take 1 capsule (25 mg total) by mouth at bedtime as needed (for restless leg syndrome symptoms)., Disp: 30 capsule, Rfl: 2   sertraline (ZOLOFT) 100 MG tablet, Take 2.5 tablets (250 mg total) by mouth daily., Disp: 225 tablet, Rfl: 0   VENTOLIN HFA 108 (90 Base) MCG/ACT inhaler, INHALE 1 TO 2 PUFFS BY MOUTH EVERY 4 HOURS AS NEEDED FOR WHEEZING AND FOR SHORTNESS OF BREATH,  Disp: 18 g, Rfl: 3   zolpidem (AMBIEN) 5 MG tablet, Take 1 tablet (5 mg total) by mouth at bedtime as needed for sleep., Disp: 15 tablet, Rfl: 1  Allergies  Allergen Reactions   Benadryl Allergy [Diphenhydramine Hcl]     Rapid heart rate.     ROS    Objective  There were no vitals filed for this visit.  There is no height  or weight on file to calculate BMI.  Physical Exam Vitals reviewed.         Recent Results (from the past 2160 hour(s))  CBC with Differential/Platelet     Status: Abnormal   Collection Time: 03/30/22  2:11 PM  Result Value Ref Range   WBC 7.7 3.8 - 10.8 Thousand/uL   RBC 4.23 3.80 - 5.10 Million/uL   Hemoglobin 11.1 (L) 11.7 - 15.5 g/dL   HCT 33.9 (L) 35.0 - 45.0 %   MCV 80.1 80.0 - 100.0 fL   MCH 26.2 (L) 27.0 - 33.0 pg   MCHC 32.7 32.0 - 36.0 g/dL   RDW 14.8 11.0 - 15.0 %   Platelets 341 140 - 400 Thousand/uL   MPV 10.2 7.5 - 12.5 fL   Neutro Abs 3,611 1,500 - 7,800 cells/uL   Lymphs Abs 3,542 850 - 3,900 cells/uL   Absolute Monocytes 416 200 - 950 cells/uL   Eosinophils Absolute 100 15 - 500 cells/uL   Basophils Absolute 31 0 - 200 cells/uL   Neutrophils Relative % 46.9 %   Total Lymphocyte 46.0 %   Monocytes Relative 5.4 %   Eosinophils Relative 1.3 %   Basophils Relative 0.4 %  COMPLETE METABOLIC PANEL WITH GFR     Status: Abnormal   Collection Time: 03/30/22  2:11 PM  Result Value Ref Range   Glucose, Bld 90 65 - 99 mg/dL    Comment: .            Fasting reference interval .    BUN 6 (L) 7 - 25 mg/dL   Creat 0.62 0.50 - 0.97 mg/dL   eGFR 122 > OR = 60 mL/min/1.85m   BUN/Creatinine Ratio 10 6 - 22 (calc)   Sodium 138 135 - 146 mmol/L   Potassium 3.5 3.5 - 5.3 mmol/L   Chloride 105 98 - 110 mmol/L   CO2 22 20 - 32 mmol/L   Calcium 9.4 8.6 - 10.2 mg/dL   Total Protein 7.3 6.1 - 8.1 g/dL   Albumin 4.5 3.6 - 5.1 g/dL   Globulin 2.8 1.9 - 3.7 g/dL (calc)   AG Ratio 1.6 1.0 - 2.5 (calc)   Total Bilirubin 0.5 0.2 - 1.2 mg/dL   Alkaline phosphatase (APISO) 62 31 - 125 U/L   AST 12 10 - 30 U/L   ALT 11 6 - 29 U/L  TSH     Status: None   Collection Time: 03/30/22  2:11 PM  Result Value Ref Range   TSH 2.08 mIU/L    Comment:           Reference Range .           > or = 20 Years  0.40-4.50 .                Pregnancy Ranges           First trimester     0.26-2.66           Second trimester   0.55-2.73  Third trimester    0.43-2.91   Iron, TIBC and Ferritin Panel     Status: Abnormal   Collection Time: 03/30/22  2:11 PM  Result Value Ref Range   Iron 52 40 - 190 mcg/dL   TIBC 426 250 - 450 mcg/dL (calc)   %SAT 12 (L) 16 - 45 % (calc)   Ferritin 4 (L) 16 - 154 ng/mL     Fall Risk:    03/30/2022    1:16 PM 12/21/2021    2:50 PM 03/25/2021    8:41 AM 03/08/2021    8:30 AM 02/01/2021    8:28 AM  Fall Risk   Falls in the past year? 0 0 0 0 0  Number falls in past yr: 0 0 0 0 0  Injury with Fall? 0 0 0 0 0  Risk for fall due to : No Fall Risks  No Fall Risks    Follow up Falls prevention discussed;Education provided;Falls evaluation completed Falls evaluation completed Falls prevention discussed Falls evaluation completed Falls evaluation completed     Functional Status Survey:     Assessment & Plan    -USPSTF grade A and B recommendations reviewed with patient; age-appropriate recommendations, preventive care, screening tests, etc discussed and encouraged; healthy living encouraged; see AVS for patient education given to patient -Discussed importance of 150 minutes of physical activity weekly, eat two servings of fish weekly, eat one serving of tree nuts ( cashews, pistachios, pecans, almonds.Marland Kitchen) every other day, eat 6 servings of fruit/vegetables daily and drink plenty of water and avoid sweet beverages.   -Reviewed Health Maintenance: ***

## 2022-05-16 ENCOUNTER — Encounter: Payer: Managed Care, Other (non HMO) | Admitting: Physician Assistant

## 2022-05-16 DIAGNOSIS — Z0000 Encounter for general adult medical examination without abnormal findings: Secondary | ICD-10-CM

## 2022-05-16 NOTE — Progress Notes (Deleted)
Name: Diane Williamson   MRN: [id number]    DOB: [birth year]   Date:05/16/2022       Progress Note  Subjective  Chief Complaint  No chief complaint on file.   HPI  Patient presents for annual CPE.  Diet: *** Exercise: ***  Sleep:*** Mood:***  Flowsheet Row Office Visit from 12/21/2021 in [facility]  AUDIT-C Score 0      Depression: Phq 9 is  {Desc; negative/positive:13464}    03/30/2022    1:16 PM 12/21/2021    2:51 PM 06/10/2021    9:28 AM 03/25/2021    8:41 AM 03/08/2021    8:30 AM  Depression screen PHQ 2/9  Decreased Interest 2 0  0 0  Down, Depressed, Hopeless 2 0  0 0  PHQ - 2 Score 4 0  0 0  Altered sleeping 3   0 0  Tired, decreased energy 3   0 0  Change in appetite 2   0 0  Feeling bad or failure about yourself  1   0 0  Trouble concentrating 2   0 0  Moving slowly or fidgety/restless 2   0 0  Suicidal thoughts 1   0 0  PHQ-9 Score 18   0 0  Difficult doing work/chores Very difficult   Not difficult at all Not difficult at all     Information is confidential and restricted. Go to Review Flowsheets to unlock data.   Hypertension: BP Readings from Last 3 Encounters:  03/30/22 132/76  02/23/22 124/72  12/21/21 128/82   Obesity: Wt Readings from Last 3 Encounters:  02/23/22 260 lb (117.9 kg)  12/21/21 (!) 306 lb 1.6 oz (138.8 kg)  03/08/21 (!) 310 lb (140.6 kg)   BMI Readings from Last 3 Encounters:  03/30/22 36.26 kg/m  02/23/22 37.31 kg/m  12/21/21 42.69 kg/m     Vaccines:   HPV:  Tdap:  Shingrix:  Pneumonia:  Flu:  COVID-19:   Hep C Screening:  STD testing and prevention (HIV/chl/gon/syphilis):  Intimate partner violence: {Desc; negative/positive:13464} Sexual History : Menstrual History/LMP/Abnormal Bleeding:  Discussed importance of follow up if any post-menopausal bleeding: {Response; yes/no/na:63} Incontinence Symptoms: {yes no:314532}  Breast cancer:  - Last Mammogram: *** - BRCA gene  screening:   Osteoporosis Prevention : Discussed high calcium and vitamin D supplementation, weight bearing exercises Bone density :{Response; yes/no/na:63}  Cervical cancer screening:   Skin cancer: Discussed monitoring for atypical lesions  Colorectal cancer: ***   Lung cancer:  Low Dose CT Chest recommended if Age 75-80 years, 20 pack-year currently smoking OR have quit w/in 15years. Patient {DOES NOT does:27190::"does not"} qualify.   ECG: ***  Advanced Care Planning: A voluntary discussion about advance care planning including the explanation and discussion of advance directives.  Discussed health care proxy and Living will, and the patient was able to identify a health care proxy as ***.  Patient {DOES_DOES JZ:[id number]   Lipids: Lab Results  Component Value Date   CHOL 202 (H) 02/01/2021   CHOL 162 06/07/2018   Lab Results  Component Value Date   HDL 48 (L) 02/01/2021   HDL 48 (L) 06/07/2018   Lab Results  Component Value Date   LDLCALC 122 (H) 02/01/2021   LDLCALC 91 06/07/2018   Lab Results  Component Value Date   TRIG 204 (H) 02/01/2021   TRIG 135 06/07/2018   Lab Results  Component Value Date   CHOLHDL 4.2 02/01/2021  CHOLHDL 3.4 06/07/2018   No results found for: "LDLDIRECT"  Glucose: Glucose  Date Value Ref Range Status  08/05/2013 90 65 - 99 mg/dL Final   Glucose, Bld  Date Value Ref Range Status  03/30/2022 90 65 - 99 mg/dL Final    Comment:    .            Fasting reference interval .   12/21/2021 112 (H) 70 - 99 mg/dL Final    Comment:    Glucose reference range applies only to samples taken after fasting for at least 8 hours.  02/01/2021 88 65 - 99 mg/dL Final    Comment:    .            Fasting reference interval .     Patient Active Problem List   Diagnosis Date Noted   Moderate episode of recurrent major depressive disorder ([place]) 04/06/2022   Obesity, Class III, BMI 40-49.9 (morbid obesity) ([place]) 03/30/2022   Generalized  anxiety disorder 12/30/2019   Depression with anxiety 12/30/2019   Chronic bilateral low back pain without sciatica 12/30/2019   Psychophysiological insomnia 12/30/2019   Carpal tunnel syndrome of right wrist 11/16/2019   Dysplasia of cervix, low grade (CIN 1) 07/04/2019   Chronic venous insufficiency 09/03/2018   Eczema 07/03/2018   Allergic rhinitis 07/03/2018   GERD (gastroesophageal reflux disease) 07/03/2018   Vitamin D deficiency 07/03/2018   Obesity (BMI 35.0-39.9 without comorbidity) 06/07/2018   Anemia 06/07/2018   Hematuria, microscopic 06/07/2018   ADHD (attention deficit hyperactivity disorder), 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/2018   Varicose veins of both lower extremities with pain 06/07/2018   Adjustment disorder with mixed anxiety and depressed mood 10/10/2017   Personal history of nonsuicidal self-harm 08/29/2017   Vitiligo 12/08/2010    Past Surgical History:  Procedure Laterality Date   NO PAST SURGERIES      Family History  Problem Relation Age of Onset   Heart disease Mother    Hypertension Mother    ADD / ADHD Father    Hypertension Father    Cancer Maternal Grandmother    Heart disease Maternal Grandmother    Diabetes Maternal Grandmother    Ovarian cancer Maternal Grandmother    Diabetes Maternal Grandfather    Diabetes Paternal Grandmother    Atrial fibrillation Paternal Grandmother    AAA (abdominal aortic aneurysm) Paternal Grandmother    Heart disease Paternal Grandmother    Heart attack Paternal Grandfather    Varicose Veins Paternal Grandfather    Breast cancer Neg Hx     Social History   Socioeconomic History   Marital status: Divorced    Spouse name: Not on file   Number of children: Not on file   Years of education: 12   Highest education level: Some college, no degree  Occupational History   Not on file  Tobacco Use   Smoking status: Former    Packs/day: 0.50    Years: 7.00    Total pack years: 3.50    Types: Cigarettes    Quit  date: 01/17/2016    Years since quitting: 6.3   Smokeless tobacco: Never  Vaping Use   Vaping Use: Never used  Substance and Sexual Activity   Alcohol use: Yes    Alcohol/week: 2.0 standard drinks of alcohol    Types: 2 Glasses of wine per week   Drug use: No   Sexual activity: Yes    Partners: Male    Birth control/protection: Inserts  Other Topics Concern  Not on file  Social History Narrative   Not on file   Social Determinants of Health   Financial Resource Strain: Low Risk  (02/01/2021)   Overall Financial Resource Strain (CARDIA)    Difficulty of Paying Living Expenses: Not hard at all  Food Insecurity: No Food Insecurity (02/01/2021)   Hunger Vital Sign    Worried About Running Out of Food in the Last Year: Never true    Ran Out of Food in the Last Year: Never true  Transportation Needs: No Transportation Needs (02/01/2021)   PRAPARE - Hydrologist (Medical): No    Lack of Transportation (Non-Medical): No  Physical Activity: Insufficiently Active (02/01/2021)   Exercise Vital Sign    Days of Exercise per Week: 2 days    Minutes of Exercise per Session: 20 min  Stress: Stress Concern Present (02/01/2021)   [place]    Feeling of Stress : To some extent  Social Connections: Moderately Integrated (02/01/2021)   Social Connection and Isolation Panel [NHANES]    Frequency of Communication with Friends and Family: More than three times a week    Frequency of Social Gatherings with Friends and Family: Twice a week    Attends Religious Services: More than 4 times per year    Active Member of [employer] or Organizations: Yes    Attends Music therapist: More than 4 times per year    Marital Status: Divorced  Intimate Partner Violence: Not At Risk (02/01/2021)   Humiliation, Afraid, Rape, and Kick questionnaire    Fear of Current or Ex-Partner: No    Emotionally  Abused: No    Physically Abused: No    Sexually Abused: No     Current Outpatient Medications:    acyclovir (ZOVIRAX) 800 MG tablet, TAKE 1 TABLET BY MOUTH THREE TIMES DAILY FOR 2 DAYS WITH  OUTBREAKS,  ONE  DAILY  FOR  SUPPRESSION, Disp: 38 tablet, Rfl: 0   busPIRone (BUSPAR) 5 MG tablet, Take 1-4 tablets (5-20 mg total) by mouth 3 (three) times daily as needed., Disp: 90 tablet, Rfl: 1   etonogestrel-ethinyl estradiol (NUVARING) 0.12-0.015 MG/24HR vaginal ring, Insert vaginally and leave in place for 3 consecutive weeks, then remove for 1 week., Disp: 3 each, Rfl: 3   ipratropium-albuterol (DUONEB) 0.5-2.5 (3) MG/3ML SOLN, Take 3 mLs by nebulization every 4 (four) hours as needed., Disp: 360 mL, Rfl: 0   meloxicam (MOBIC) 15 MG tablet, Take 1 tablet (15 mg total) by mouth daily., Disp: 30 tablet, Rfl: 2   pregabalin (LYRICA) 25 MG capsule, Take 1 capsule (25 mg total) by mouth at bedtime as needed (for restless leg syndrome symptoms)., Disp: 30 capsule, Rfl: 2   sertraline (ZOLOFT) 100 MG tablet, Take 2.5 tablets (250 mg total) by mouth daily., Disp: 225 tablet, Rfl: 0   VENTOLIN HFA 108 (90 Base) MCG/ACT inhaler, INHALE 1 TO 2 PUFFS BY MOUTH EVERY 4 HOURS AS NEEDED FOR WHEEZING AND FOR SHORTNESS OF BREATH, Disp: 18 g, Rfl: 3   zolpidem (AMBIEN) 5 MG tablet, Take 1 tablet (5 mg total) by mouth at bedtime as needed for sleep., Disp: 15 tablet, Rfl: 1  Allergies  Allergen Reactions   Benadryl Allergy [Diphenhydramine Hcl]     Rapid heart rate.     ROS  ***  Objective  There were no vitals filed for this visit.  There is no height or weight on file to calculate  BMI.  Physical Exam ***  Recent Results (from the past 2160 hour(s))  CBC with Differential/Platelet     Status: Abnormal   Collection Time: 03/30/22  2:11 PM  Result Value Ref Range   WBC 7.7 3.8 - 10.8 Thousand/uL   RBC 4.23 3.80 - 5.10 Million/uL   Hemoglobin 11.1 (L) 11.7 - 15.5 g/dL   HCT 33.9 (L) 35.0 - 45.0 %    MCV 80.1 80.0 - 100.0 fL   MCH 26.2 (L) 27.0 - 33.0 pg   MCHC 32.7 32.0 - 36.0 g/dL   RDW 14.8 11.0 - 15.0 %   Platelets 341 140 - 400 Thousand/uL   MPV 10.2 7.5 - 12.5 fL   Neutro Abs 3,611 1,500 - 7,800 cells/uL   Lymphs Abs 3,542 850 - 3,900 cells/uL   Absolute Monocytes 416 200 - 950 cells/uL   Eosinophils Absolute 100 15 - 500 cells/uL   Basophils Absolute 31 0 - 200 cells/uL   Neutrophils Relative % 46.9 %   Total Lymphocyte 46.0 %   Monocytes Relative 5.4 %   Eosinophils Relative 1.3 %   Basophils Relative 0.4 %  COMPLETE METABOLIC PANEL WITH GFR     Status: Abnormal   Collection Time: 03/30/22  2:11 PM  Result Value Ref Range   Glucose, Bld 90 65 - 99 mg/dL    Comment: .            Fasting reference interval .    BUN 6 (L) 7 - 25 mg/dL   Creat 0.62 0.50 - 0.97 mg/dL   eGFR 122 > OR = 60 mL/min/1.36m   BUN/Creatinine Ratio 10 6 - 22 (calc)   Sodium 138 135 - 146 mmol/L   Potassium 3.5 3.5 - 5.3 mmol/L   Chloride 105 98 - 110 mmol/L   CO2 22 20 - 32 mmol/L   Calcium 9.4 8.6 - 10.2 mg/dL   Total Protein 7.3 6.1 - 8.1 g/dL   Albumin 4.5 3.6 - 5.1 g/dL   Globulin 2.8 1.9 - 3.7 g/dL (calc)   AG Ratio 1.6 1.0 - 2.5 (calc)   Total Bilirubin 0.5 0.2 - 1.2 mg/dL   Alkaline phosphatase (APISO) 62 31 - 125 U/L   AST 12 10 - 30 U/L   ALT 11 6 - 29 U/L  TSH     Status: None   Collection Time: 03/30/22  2:11 PM  Result Value Ref Range   TSH 2.08 mIU/L    Comment:           Reference Range .           > or = 20 Years  0.40-4.50 .                Pregnancy Ranges           First trimester    0.26-2.66           Second trimester   0.55-2.73           Third trimester    0.43-2.91   Iron, TIBC and Ferritin Panel     Status: Abnormal   Collection Time: 03/30/22  2:11 PM  Result Value Ref Range   Iron 52 40 - 190 mcg/dL   TIBC 426 250 - 450 mcg/dL (calc)   %SAT 12 (L) 16 - 45 % (calc)   Ferritin 4 (L) 16 - 154 ng/mL     Fall Risk:    03/30/2022    1:16 PM  12/21/2021  2:50 PM 03/25/2021    8:41 AM 03/08/2021    8:30 AM 02/01/2021    8:28 AM  Fall Risk   Falls in the past year? 0 0 0 0 0  Number falls in past yr: 0 0 0 0 0  Injury with Fall? 0 0 0 0 0  Risk for fall due to : No Fall Risks  No Fall Risks    Follow up Falls prevention discussed;Education provided;Falls evaluation completed Falls evaluation completed Falls prevention discussed Falls evaluation completed Falls evaluation completed   ***  Functional Status Survey:   ***  Assessment & Plan  There are no diagnoses linked to this encounter.  -USPSTF grade A and B recommendations reviewed with patient; age-appropriate recommendations, preventive care, screening tests, etc discussed and encouraged; healthy living encouraged; see AVS for patient education given to patient -Discussed importance of 150 minutes of physical activity weekly, eat two servings of fish weekly, eat one serving of tree nuts ( cashews, pistachios, pecans, almonds.Marland Kitchen) every other day, eat 6 servings of fruit/vegetables daily and drink plenty of water and avoid sweet beverages.   -Reviewed Health Maintenance: {yes LI:[id number]

## 2022-09-23 ENCOUNTER — Other Ambulatory Visit (HOSPITAL_COMMUNITY)
Admission: RE | Admit: 2022-09-23 | Discharge: 2022-09-23 | Disposition: A | Discharge status: Home / Self Care | Payer: Managed Care, Other (non HMO) | Source: Ambulatory Visit | Attending: Family Medicine | Admitting: Family Medicine

## 2022-09-23 ENCOUNTER — Encounter: Payer: Self-pay | Admitting: Family Medicine

## 2022-09-23 ENCOUNTER — Ambulatory Visit: Payer: Managed Care, Other (non HMO) | Admitting: Family Medicine

## 2022-09-23 VITALS — BP 128/70 | HR 91 | Resp 16 | Ht 70.0 in | Wt 284.0 lb

## 2022-09-23 DIAGNOSIS — Z3009 Encounter for other general counseling and advice on contraception: Secondary | ICD-10-CM | POA: Diagnosis not present

## 2022-09-23 DIAGNOSIS — Z113 Encounter for screening for infections with a predominantly sexual mode of transmission: Secondary | ICD-10-CM | POA: Insufficient documentation

## 2022-09-23 DIAGNOSIS — F900 Attention-deficit hyperactivity disorder, predominantly inattentive type: Secondary | ICD-10-CM

## 2022-09-23 DIAGNOSIS — Z5181 Encounter for therapeutic drug level monitoring: Secondary | ICD-10-CM | POA: Insufficient documentation

## 2022-09-23 DIAGNOSIS — Z3044 Encounter for surveillance of vaginal ring hormonal contraceptive device: Secondary | ICD-10-CM | POA: Insufficient documentation

## 2022-09-23 DIAGNOSIS — E876 Hypokalemia: Secondary | ICD-10-CM

## 2022-09-23 DIAGNOSIS — F4323 Adjustment disorder with mixed anxiety and depressed mood: Secondary | ICD-10-CM

## 2022-09-23 DIAGNOSIS — J3089 Other allergic rhinitis: Secondary | ICD-10-CM

## 2022-09-23 DIAGNOSIS — J4550 Severe persistent asthma, uncomplicated: Secondary | ICD-10-CM

## 2022-09-23 DIAGNOSIS — F411 Generalized anxiety disorder: Secondary | ICD-10-CM

## 2022-09-23 DIAGNOSIS — F32A Depression, unspecified: Secondary | ICD-10-CM

## 2022-09-23 DIAGNOSIS — D649 Anemia, unspecified: Secondary | ICD-10-CM

## 2022-09-23 DIAGNOSIS — G4700 Insomnia, unspecified: Secondary | ICD-10-CM

## 2022-09-23 DIAGNOSIS — K219 Gastro-esophageal reflux disease without esophagitis: Secondary | ICD-10-CM

## 2022-09-23 MED ORDER — MONTELUKAST SODIUM 10 MG PO TABS: 10.0000 mg | ORAL_TABLET | Freq: Every day | ORAL | 5 refills | Status: DC

## 2022-09-23 MED ORDER — IPRATROPIUM-ALBUTEROL 0.5-2.5 (3) MG/3ML IN SOLN: 3.0000 mL | RESPIRATORY_TRACT | 0 refills | Status: AC | PRN

## 2022-09-23 MED ORDER — ETONOGESTREL-ETHINYL ESTRADIOL 0.12-0.015 MG/24HR VA RING: VAGINAL_RING | VAGINAL | 11 refills | Status: DC

## 2022-09-23 MED ORDER — BUSPIRONE HCL 15 MG PO TABS: 15.0000 mg | ORAL_TABLET | Freq: Three times a day (TID) | ORAL | 11 refills | Status: DC | PRN

## 2022-09-23 MED ORDER — BUDESONIDE-FORMOTEROL FUMARATE 160-4.5 MCG/ACT IN AERO: INHALATION_SPRAY | RESPIRATORY_TRACT | 11 refills | Status: DC

## 2022-09-23 NOTE — Progress Notes (Unsigned)
Name: Diane Williamson   MRN: [id number]    DOB: February 06, 1991   Date:09/23/2022       Progress Note  Chief Complaint  Patient presents with   Follow-up     Subjective:   Diane Williamson is a 32 y.o. female, presents to clinic for f/up on chronic conditions  Anxiety/depression was on higher dose zoloft which was increased about 6 months ago but she could not get it from the pharmacy and so she weaned off of it around Feb this year (4 months ago), overall feels good She is using other things to cope with sx Pt referred to psychiatry and they called her and didn't connect and she returned the call     09/23/2022   10:10 AM 03/30/2022    1:16 PM 12/21/2021    2:51 PM  Depression screen PHQ 2/9  Decreased Interest 0 2 0  Down, Depressed, Hopeless 0 2 0  PHQ - 2 Score 0 4 0  Altered sleeping 0 3   Tired, decreased energy 0 3   Change in appetite 0 2   Feeling bad or failure about yourself  0 1   Trouble concentrating 0 2   Moving slowly or fidgety/restless 0 2   Suicidal thoughts 0 1   PHQ-9 Score 0 18   Difficult doing work/chores  Very difficult       03/30/2022    1:23 PM 06/10/2021    9:32 AM 02/01/2021    8:33 AM 01/01/2021    9:51 AM  GAD 7 : Generalized Anxiety Score  Nervous, Anxious, on Edge 3  0 0  Control/stop worrying 3  0 0  Worry too much - different things 3  0 0  Trouble relaxing 2  0 0  Restless 3  1 0  Easily annoyed or irritable 2  0 0  Afraid - awful might happen 1  0 0  Total GAD 7 Score 17  1 0  Anxiety Difficulty Very difficult  Not difficult at all Not difficult at all     Information is confidential and restricted. Go to Review Flowsheets to unlock data.       Asthma more uncontrolled recently:  09/23/22 1022  Asthma History  Symptoms Daily  Nighttime Awakenings Often--7/wk  Asthma interference with normal activity Some limitations  SABA use (not for EIB) Several times/day  Risk: Exacerbations requiring oral systemic steroids 0-1 / year   Asthma Severity Moderate Persistent   OCP refill - using nuvaring, no vaginal sx or complaints     Current Outpatient Medications:    acyclovir (ZOVIRAX) 800 MG tablet, TAKE 1 TABLET BY MOUTH THREE TIMES DAILY FOR 2 DAYS WITH  OUTBREAKS,  ONE  DAILY  FOR  SUPPRESSION, Disp: 38 tablet, Rfl: 0   busPIRone (BUSPAR) 5 MG tablet, Take 1-4 tablets (5-20 mg total) by mouth 3 (three) times daily as needed., Disp: 90 tablet, Rfl: 1   etonogestrel-ethinyl estradiol (NUVARING) 0.12-0.015 MG/24HR vaginal ring, Insert vaginally and leave in place for 3 consecutive weeks, then remove for 1 week., Disp: 3 each, Rfl: 3   ipratropium-albuterol (DUONEB) 0.5-2.5 (3) MG/3ML SOLN, Take 3 mLs by nebulization every 4 (four) hours as needed., Disp: 360 mL, Rfl: 0   meloxicam (MOBIC) 15 MG tablet, Take 1 tablet (15 mg total) by mouth daily., Disp: 30 tablet, Rfl: 2   VENTOLIN HFA 108 (90 Base) MCG/ACT inhaler, INHALE 1 TO 2 PUFFS BY MOUTH EVERY 4 HOURS AS NEEDED FOR  WHEEZING AND FOR SHORTNESS OF BREATH, Disp: 18 g, Rfl: 3   pregabalin (LYRICA) 25 MG capsule, Take 1 capsule (25 mg total) by mouth at bedtime as needed (for restless leg syndrome symptoms). (Patient not taking: Reported on 09/23/2022), Disp: 30 capsule, Rfl: 2   sertraline (ZOLOFT) 100 MG tablet, Take 2.5 tablets (250 mg total) by mouth daily., Disp: 225 tablet, Rfl: 0   zolpidem (AMBIEN) 5 MG tablet, Take 1 tablet (5 mg total) by mouth at bedtime as needed for sleep. (Patient not taking: Reported on 09/23/2022), Disp: 15 tablet, Rfl: 1  Patient Active Problem List   Diagnosis Date Noted   Moderate episode of recurrent major depressive disorder ([facility]) 04/06/2022   Obesity, Class III, BMI 40-49.9 (morbid obesity) ([facility]) 03/30/2022   Generalized anxiety disorder 12/30/2019   Depression with anxiety 12/30/2019   Chronic bilateral low back pain without sciatica 12/30/2019   Psychophysiological insomnia 12/30/2019   Carpal tunnel syndrome of right wrist  11/16/2019   Dysplasia of cervix, low grade (CIN 1) 07/04/2019   Chronic venous insufficiency 09/03/2018   Eczema 07/03/2018   Allergic rhinitis 07/03/2018   GERD (gastroesophageal reflux disease) 07/03/2018   Vitamin D deficiency 07/03/2018   Obesity (BMI 35.0-39.9 without comorbidity) 06/07/2018   Anemia 06/07/2018   Hematuria, microscopic 06/07/2018   ADHD (attention deficit hyperactivity disorder), 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/2018   Varicose veins of both lower extremities with pain 06/07/2018   Adjustment disorder with mixed anxiety and depressed mood 10/10/2017   Personal history of nonsuicidal self-harm 08/29/2017   Vitiligo 12/08/2010    Past Surgical History:  Procedure Laterality Date   NO PAST SURGERIES      Family History  Problem Relation Age of Onset   Heart disease Mother    Hypertension Mother    ADD / ADHD Father    Hypertension Father    Cancer Maternal Grandmother    Heart disease Maternal Grandmother    Diabetes Maternal Grandmother    Ovarian cancer Maternal Grandmother    Diabetes Maternal Grandfather    Diabetes Paternal Grandmother    Atrial fibrillation Paternal Grandmother    AAA (abdominal aortic aneurysm) Paternal Grandmother    Heart disease Paternal Grandmother    Heart attack Paternal Grandfather    Varicose Veins Paternal Grandfather    Breast cancer Neg Hx     Social History   Tobacco Use   Smoking status: Former    Packs/day: 0.50    Years: 7.00    Additional pack years: 0.00    Total pack years: 3.50    Types: Cigarettes    Quit date: 01/17/2016    Years since quitting: 6.6   Smokeless tobacco: Never  Vaping Use   Vaping Use: Never used  Substance Use Topics   Alcohol use: Yes    Alcohol/week: 2.0 standard drinks of alcohol    Types: 2 Glasses of wine per week   Drug use: No     Allergies  Allergen Reactions   Benadryl Allergy [Diphenhydramine Hcl]     Rapid heart rate.    Health Maintenance  Topic Date Due   COVID-19  Vaccine (1) Never done   PAP SMEAR-Modifier  06/25/2020   INFLUENZA VACCINE  11/17/2022   DTaP/Tdap/Td (2 - Td or Tdap) 10/03/2029   Hepatitis C Screening  Completed   HIV Screening  Completed   HPV VACCINES  Aged Out    Chart Review Today: I personally reviewed active problem list, medication list, allergies, family history, social  history, health maintenance, notes from last encounter, lab results, imaging with the patient/caregiver today.   Review of Systems  Constitutional: Negative.   HENT: Negative.    Eyes: Negative.   Respiratory: Negative.    Cardiovascular: Negative.   Gastrointestinal: Negative.   Endocrine: Negative.   Genitourinary: Negative.   Musculoskeletal: Negative.   Skin: Negative.   Allergic/Immunologic: Negative.   Neurological: Negative.   Hematological: Negative.   Psychiatric/Behavioral: Negative.    All other systems reviewed and are negative.    Objective:   Vitals:   09/23/22 1009  BP: 128/70  Pulse: 91  Resp: 16  SpO2: 100%  Weight: 284 lb (128.8 kg)  Height: 5\' 10"  (1.778 m)    Body mass index is 40.75 kg/m.  Physical Exam Vitals and nursing note reviewed.  Constitutional:      General: She is not in acute distress.    Appearance: Normal appearance. She is well-developed. She is obese. She is not ill-appearing, toxic-appearing or diaphoretic.  HENT:     Head: Normocephalic and atraumatic.     Nose: Nose normal.  Eyes:     General:        Right eye: No discharge.        Left eye: No discharge.     Conjunctiva/sclera: Conjunctivae normal.  Neck:     Trachea: No tracheal deviation.  Cardiovascular:     Rate and Rhythm: Normal rate and regular rhythm.     Pulses: Normal pulses.     Heart sounds: Normal heart sounds.  Pulmonary:     Effort: Pulmonary effort is normal. No respiratory distress.     Breath sounds: Normal breath sounds. No stridor.  Abdominal:     General: Bowel sounds are normal.     Palpations: Abdomen is  soft.  Musculoskeletal:        General: Normal range of motion.  Skin:    General: Skin is warm and dry.     Findings: No rash.  Neurological:     Mental Status: She is alert.     Motor: No abnormal muscle tone.     Coordination: Coordination normal.  Psychiatric:        Mood and Affect: Mood normal.        Behavior: Behavior normal.         Assessment & Plan:   Problem List Items Addressed This Visit       Respiratory   Allergic rhinitis    Continue OTC meds and singulair      Relevant Medications   montelukast (SINGULAIR) 10 MG tablet   Other Relevant Orders   CBC with Differential/Platelet (Completed)     Digestive   GERD (gastroesophageal reflux disease)    Sx currently controlled with OTC meds/diet/lifestyle efforts        Other   Anemia    Recheck labs      Relevant Orders   CBC with Differential/Platelet (Completed)   Iron, TIBC and Ferritin Panel (Completed)   ADHD (attention deficit hyperactivity disorder), inattentive type   Adjustment disorder with mixed anxiety and depressed mood - Primary    She feels mood and sx are currently well controlled off zoloft and she's using other coping skills and buspar prn Phq 9 reviewed and negative  Gad 7 not done today      Relevant Medications   busPIRone (BUSPAR) 15 MG tablet   Generalized anxiety disorder    Sx currently well controlled with buspar and other coping skills  Relevant Medications   busPIRone (BUSPAR) 15 MG tablet   Obesity, Class III, BMI 40-49.9 (morbid obesity) ([facility])            Relevant Orders   CBC with Differential/Platelet (Completed)   COMPLETE METABOLIC PANEL WITH GFR (Completed)   Iron, TIBC and Ferritin Panel (Completed)   Other Visit Diagnoses     Severe persistent asthma, unspecified whether complicated       uncontrolled needs increased maintenance therapy -  Will see coverage, for now nebulizer machine with duonebs BID-TID PRN, avoid triggers   Relevant  Medications   ipratropium-albuterol (DUONEB) 0.5-2.5 (3) MG/3ML SOLN   montelukast (SINGULAIR) 10 MG tablet   budesonide-formoterol (SYMBICORT) 160-4.5 MCG/ACT inhaler   Other Relevant Orders   CBC with Differential/Platelet (Completed)   Family planning services       STD screening done today with cytology and nuvaring refills sent in   Relevant Medications   etonogestrel-ethinyl estradiol (NUVARING) 0.12-0.015 MG/24HR vaginal ring   Encounter for medication monitoring       Relevant Medications   etonogestrel-ethinyl estradiol (NUVARING) 0.12-0.015 MG/24HR vaginal ring   Other Relevant Orders   CBC with Differential/Platelet (Completed)   COMPLETE METABOLIC PANEL WITH GFR (Completed)   Iron, TIBC and Ferritin Panel (Completed)   Urine cytology ancillary only (Completed)   Encounter for surveillance of vaginal ring hormonal contraceptive device       Relevant Orders   Urine cytology ancillary only (Completed)   Hypokalemia       recheck chemistry   Relevant Orders   COMPLETE METABOLIC PANEL WITH GFR (Completed)      ADHD hx in chart - prior PCP Dr. Sherie Don noted the following:  ADD; has had it for as long as she can remember; always was on medicine for that; Ritalin at first, then Concerta; then another medicine and it didn't work; Concerta helped the most; fluttery heart sometimes, just once in a while, random, not associated with exercise  Hx of anemia; no blood in the urine; no blood in the stools; periods are 5 days, on Nuvaring which helps a lot; cut down on bleeding "immensely"   ADHD (attention deficit hyperactivity disorder), inattentive type  Will refer to psychologist for confirmation of ADHD inattentive type, and if confirmed, I'll be willing to prescribe Concerta and follow  I do not see meds ever prescribed that I can verify in our system and I do not see psych consult or other records    Return for 1 month asthma allergy  f/up.   Danelle Berry, PA-C 09/23/22 10:13  AM

## 2022-09-24 LAB — CBC WITH DIFFERENTIAL/PLATELET
Absolute Monocytes: 416 cells/uL (ref 200–950)
Basophils Absolute: 33 cells/uL (ref 0–200)
Basophils Relative: 0.5 %
Eosinophils Absolute: 79 cells/uL (ref 15–500)
Eosinophils Relative: 1.2 %
HCT: 37.6 % (ref 35.0–45.0)
Hemoglobin: 12.1 g/dL (ref 11.7–15.5)
Lymphs Abs: 3122 cells/uL (ref 850–3900)
MCH: 26.9 pg — ABNORMAL LOW (ref 27.0–33.0)
MCHC: 32.2 g/dL (ref 32.0–36.0)
MCV: 83.6 fL (ref 80.0–100.0)
MPV: 10.2 fL (ref 7.5–12.5)
Monocytes Relative: 6.3 %
Neutro Abs: 2950 cells/uL (ref 1500–7800)
Neutrophils Relative %: 44.7 %
Platelets: 306 10*3/uL (ref 140–400)
RBC: 4.5 10*6/uL (ref 3.80–5.10)
RDW: 15 % (ref 11.0–15.0)
Total Lymphocyte: 47.3 %
WBC: 6.6 10*3/uL (ref 3.8–10.8)

## 2022-09-24 LAB — IRON,TIBC AND FERRITIN PANEL
%SAT: 7 % (calc) — ABNORMAL LOW (ref 16–45)
Ferritin: 5 ng/mL — ABNORMAL LOW (ref 16–154)
Iron: 25 ug/dL — ABNORMAL LOW (ref 40–190)
TIBC: 339 mcg/dL (calc) (ref 250–450)

## 2022-09-24 LAB — COMPLETE METABOLIC PANEL WITH GFR
AG Ratio: 1.8 (calc) (ref 1.0–2.5)
ALT: 8 U/L (ref 6–29)
AST: 10 U/L (ref 10–30)
Albumin: 4.5 g/dL (ref 3.6–5.1)
Alkaline phosphatase (APISO): 55 U/L (ref 31–125)
BUN: 8 mg/dL (ref 7–25)
CO2: 27 mmol/L (ref 20–32)
Calcium: 9.2 mg/dL (ref 8.6–10.2)
Chloride: 107 mmol/L (ref 98–110)
Creat: 0.78 mg/dL (ref 0.50–0.97)
Globulin: 2.5 g/dL (calc) (ref 1.9–3.7)
Glucose, Bld: 82 mg/dL (ref 65–99)
Potassium: 3.9 mmol/L (ref 3.5–5.3)
Sodium: 142 mmol/L (ref 135–146)
Total Bilirubin: 0.3 mg/dL (ref 0.2–1.2)
Total Protein: 7 g/dL (ref 6.1–8.1)
eGFR: 104 mL/min/{1.73_m2} (ref >=60)

## 2022-09-26 LAB — URINE CYTOLOGY ANCILLARY ONLY
Chlamydia: NEGATIVE
Comment: NEGATIVE
Comment: NEGATIVE
Comment: NORMAL
Neisseria Gonorrhea: NEGATIVE
Trichomonas: NEGATIVE

## 2022-09-29 ENCOUNTER — Encounter: Payer: Self-pay | Admitting: Family Medicine

## 2022-09-29 NOTE — Assessment & Plan Note (Signed)
Continue OTC meds and singulair

## 2022-09-29 NOTE — Assessment & Plan Note (Signed)
Sx currently controlled with OTC meds/diet/lifestyle efforts

## 2022-09-29 NOTE — Assessment & Plan Note (Signed)
Sx currently well controlled with buspar and other coping skills

## 2022-09-29 NOTE — Assessment & Plan Note (Signed)
Recheck labs 

## 2022-09-29 NOTE — Assessment & Plan Note (Signed)
She feels mood and sx are currently well controlled off zoloft and she's using other coping skills and buspar prn Phq 9 reviewed and negative  Gad 7 not done today

## 2022-10-20 ENCOUNTER — Encounter: Payer: Self-pay | Admitting: Family Medicine

## 2022-10-20 DIAGNOSIS — Z3009 Encounter for other general counseling and advice on contraception: Secondary | ICD-10-CM

## 2022-10-20 DIAGNOSIS — Z5181 Encounter for therapeutic drug level monitoring: Secondary | ICD-10-CM

## 2022-10-21 MED ORDER — ETONOGESTREL-ETHINYL ESTRADIOL 0.12-0.015 MG/24HR VA RING: VAGINAL_RING | VAGINAL | 3 refills | Status: DC

## 2022-10-25 NOTE — Telephone Encounter (Signed)
Called pt, she has an appt tomorrow and will discuss it with PCP

## 2022-10-26 ENCOUNTER — Ambulatory Visit: Payer: Managed Care, Other (non HMO) | Admitting: Family Medicine

## 2022-10-26 ENCOUNTER — Encounter: Payer: Self-pay | Admitting: Family Medicine

## 2022-10-26 VITALS — BP 122/74 | HR 86 | Temp 98.3°F | Resp 16 | Ht 70.0 in | Wt 281.7 lb

## 2022-10-26 DIAGNOSIS — F4323 Adjustment disorder with mixed anxiety and depressed mood: Secondary | ICD-10-CM

## 2022-10-26 DIAGNOSIS — K219 Gastro-esophageal reflux disease without esophagitis: Secondary | ICD-10-CM

## 2022-10-26 DIAGNOSIS — J4550 Severe persistent asthma, uncomplicated: Secondary | ICD-10-CM

## 2022-10-26 DIAGNOSIS — F900 Attention-deficit hyperactivity disorder, predominantly inattentive type: Secondary | ICD-10-CM

## 2022-10-26 DIAGNOSIS — Z3009 Encounter for other general counseling and advice on contraception: Secondary | ICD-10-CM | POA: Diagnosis not present

## 2022-10-26 DIAGNOSIS — J3089 Other allergic rhinitis: Secondary | ICD-10-CM | POA: Diagnosis not present

## 2022-10-26 DIAGNOSIS — Z5181 Encounter for therapeutic drug level monitoring: Secondary | ICD-10-CM | POA: Diagnosis not present

## 2022-10-26 DIAGNOSIS — F411 Generalized anxiety disorder: Secondary | ICD-10-CM

## 2022-10-26 MED ORDER — ACYCLOVIR 800 MG PO TABS
800.0000 mg | ORAL_TABLET | Freq: Three times a day (TID) | ORAL | 11 refills | Status: DC | PRN
Start: 2022-10-26 — End: 2024-01-30

## 2022-10-26 MED ORDER — MONTELUKAST SODIUM 10 MG PO TABS: 10.0000 mg | ORAL_TABLET | Freq: Every day | ORAL | 5 refills | Status: DC

## 2022-10-26 MED ORDER — BUSPIRONE HCL 15 MG PO TABS: 15.0000 mg | ORAL_TABLET | Freq: Three times a day (TID) | ORAL | 11 refills | Status: AC | PRN

## 2022-10-26 MED ORDER — AIRSUPRA 90-80 MCG/ACT IN AERO: 2.0000 | INHALATION_SPRAY | RESPIRATORY_TRACT | 3 refills | Status: DC | PRN

## 2022-10-26 MED ORDER — PANTOPRAZOLE SODIUM 40 MG PO TBEC: 40.0000 mg | DELAYED_RELEASE_TABLET | Freq: Every day | ORAL | 1 refills | Status: DC

## 2022-10-26 MED ORDER — ETONOGESTREL-ETHINYL ESTRADIOL 0.12-0.015 MG/24HR VA RING: VAGINAL_RING | VAGINAL | 3 refills | Status: DC

## 2022-10-26 NOTE — Progress Notes (Signed)
Name: Diane Williamson   MRN: [id number]    DOB: [birth year]   Date:10/26/2022       Progress Note  Chief Complaint  Patient presents with   Follow-up   Asthma     Subjective:   Diane Williamson is a 32 y.o. female, presents to clinic for f/up on asthma and other meds  Asthma/allergies - unable to get inhalers   Herpes outbreaks- needs acyclovir refills  Anxiety -    10/26/2022    9:22 AM 03/30/2022    1:23 PM 06/10/2021    9:32 AM 02/01/2021    8:33 AM  GAD 7 : Generalized Anxiety Score  Nervous, Anxious, on Edge 1 3  0  Control/stop worrying 1 3  0  Worry too much - different things 1 3  0  Trouble relaxing 0 2  0  Restless 0 3  1  Easily annoyed or irritable 0 2  0  Afraid - awful might happen 0 1  0  Total GAD 7 Score 3 17  1   Anxiety Difficulty Somewhat difficult Very difficult  Not difficult at all     Information is confidential and restricted. Go to Review Flowsheets to unlock data.      10/26/2022    9:16 AM 09/23/2022   10:10 AM 03/30/2022    1:16 PM  Depression screen PHQ 2/9  Decreased Interest 0 0 2  Down, Depressed, Hopeless 0 0 2  PHQ - 2 Score 0 0 4  Altered sleeping 0 0 3  Tired, decreased energy 0 0 3  Change in appetite 0 0 2  Feeling bad or failure about yourself  0 0 1  Trouble concentrating 0 0 2  Moving slowly or fidgety/restless 0 0 2  Suicidal thoughts 0 0 1  PHQ-9 Score 0 0 18  Difficult doing work/chores Not difficult at all  Very difficult    Still endorses ADHD and wanting assessment or meds, prior thorough chart review I could not find specialists eval or past documentation of rx for controlled substance meds  Birth control nuvaring - need rx changed      Current Outpatient Medications:    acyclovir (ZOVIRAX) 800 MG tablet, TAKE 1 TABLET BY MOUTH THREE TIMES DAILY FOR 2 DAYS WITH  OUTBREAKS,  ONE  DAILY  FOR  SUPPRESSION, Disp: 38 tablet, Rfl: 0   budesonide-formoterol (SYMBICORT) 160-4.5 MCG/ACT inhaler, 2 puffs BID and use 2  puffs q 6 h as needed for acute symptoms for uncontrolled asthma, Disp: 1 each, Rfl: 11   busPIRone (BUSPAR) 15 MG tablet, Take 1 tablet (15 mg total) by mouth 3 (three) times daily as needed (anxiety stress)., Disp: 90 tablet, Rfl: 11   etonogestrel-ethinyl estradiol (NUVARING) 0.12-0.015 MG/24HR vaginal ring, Insert vaginally and leave in place for 3 consecutive weeks, then remove for 1 week., Disp: 3 each, Rfl: 3   ipratropium-albuterol (DUONEB) 0.5-2.5 (3) MG/3ML SOLN, Take 3 mLs by nebulization every 4 (four) hours as needed., Disp: 360 mL, Rfl: 0   meloxicam (MOBIC) 15 MG tablet, Take 1 tablet (15 mg total) by mouth daily., Disp: 30 tablet, Rfl: 2   montelukast (SINGULAIR) 10 MG tablet, Take 1 tablet (10 mg total) by mouth at bedtime., Disp: 30 tablet, Rfl: 5   VENTOLIN HFA 108 (90 Base) MCG/ACT inhaler, INHALE 1 TO 2 PUFFS BY MOUTH EVERY 4 HOURS AS NEEDED FOR WHEEZING AND FOR SHORTNESS OF BREATH, Disp: 18 g, Rfl: 3   sertraline (ZOLOFT) 100  MG tablet, Take 2.5 tablets (250 mg total) by mouth daily., Disp: 225 tablet, Rfl: 0  Patient Active Problem List   Diagnosis Date Noted   Moderate episode of recurrent major depressive disorder (HCC) 04/06/2022   Obesity, Class III, BMI 40-49.9 (morbid obesity) (HCC) 03/30/2022   Generalized anxiety disorder 12/30/2019   Depression with anxiety 12/30/2019   Chronic bilateral low back pain without sciatica 12/30/2019   Psychophysiological insomnia 12/30/2019   Carpal tunnel syndrome of right wrist 11/16/2019   Dysplasia of cervix, low grade (CIN 1) 07/04/2019   Chronic venous insufficiency 09/03/2018   Eczema 07/03/2018   Allergic rhinitis 07/03/2018   GERD (gastroesophageal reflux disease) 07/03/2018   Vitamin D deficiency 07/03/2018   Obesity (BMI 35.0-39.9 without comorbidity) 06/07/2018   Anemia 06/07/2018   Hematuria, microscopic 06/07/2018   ADHD (attention deficit hyperactivity disorder), 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/2018   Varicose veins of  both lower extremities with pain 06/07/2018   Adjustment disorder with mixed anxiety and depressed mood 10/10/2017   Personal history of nonsuicidal self-harm 08/29/2017   Vitiligo 12/08/2010    Past Surgical History:  Procedure Laterality Date   NO PAST SURGERIES      Family History  Problem Relation Age of Onset   Heart disease Mother    Hypertension Mother    ADD / ADHD Father    Hypertension Father    Cancer Maternal Grandmother    Heart disease Maternal Grandmother    Diabetes Maternal Grandmother    Ovarian cancer Maternal Grandmother    Diabetes Maternal Grandfather    Diabetes Paternal Grandmother    Atrial fibrillation Paternal Grandmother    AAA (abdominal aortic aneurysm) Paternal Grandmother    Heart disease Paternal Grandmother    Heart attack Paternal Grandfather    Varicose Veins Paternal Grandfather    Breast cancer Neg Hx     Social History   Tobacco Use   Smoking status: Former    Packs/day: 0.50    Years: 7.00    Additional pack years: 0.00    Total pack years: 3.50    Types: Cigarettes    Quit date: 01/17/2016    Years since quitting: 6.7   Smokeless tobacco: Never  Vaping Use   Vaping Use: Never used  Substance Use Topics   Alcohol use: Yes    Alcohol/week: 2.0 standard drinks of alcohol    Types: 2 Glasses of wine per week   Drug use: No     Allergies  Allergen Reactions   Benadryl Allergy [Diphenhydramine Hcl]     Rapid heart rate.    Health Maintenance  Topic Date Due   COVID-19 Vaccine (1) Never done   PAP SMEAR-Modifier  06/25/2020   INFLUENZA VACCINE  11/17/2022   DTaP/Tdap/Td (2 - Td or Tdap) 10/03/2029   Hepatitis C Screening  Completed   HIV Screening  Completed   HPV VACCINES  Aged Out    Chart Review Today: I personally reviewed active problem list, medication list, allergies, family history, social history, health maintenance, notes from last encounter, lab results, imaging with the patient/caregiver  today.   Review of Systems  Constitutional: Negative.   HENT: Negative.    Eyes: Negative.   Respiratory: Negative.    Cardiovascular: Negative.   Gastrointestinal: Negative.   Endocrine: Negative.   Genitourinary: Negative.   Musculoskeletal: Negative.   Skin: Negative.   Allergic/Immunologic: Negative.   Neurological: Negative.   Hematological: Negative.   Psychiatric/Behavioral: Negative.    All other systems reviewed  and are negative.    Objective:   Vitals:   10/26/22 0916  BP: 122/74  Pulse: 86  Resp: 16  Temp: 98.3 F (36.8 C)  TempSrc: Oral  SpO2: 99%  Weight: 281 lb 11.2 oz (127.8 kg)  Height: 5\' 10"  (1.778 m)    Body mass index is 40.42 kg/m.  Physical Exam Vitals and nursing note reviewed.  Constitutional:      General: She is not in acute distress.    Appearance: Normal appearance. She is well-developed. She is obese. She is not ill-appearing, toxic-appearing or diaphoretic.  HENT:     Head: Normocephalic and atraumatic.     Nose: Nose normal.  Eyes:     General:        Right eye: No discharge.        Left eye: No discharge.     Conjunctiva/sclera: Conjunctivae normal.  Neck:     Trachea: No tracheal deviation.  Cardiovascular:     Rate and Rhythm: Normal rate and regular rhythm.     Pulses: Normal pulses.     Heart sounds: Normal heart sounds.  Pulmonary:     Effort: Pulmonary effort is normal. No respiratory distress.     Breath sounds: Normal breath sounds. No stridor.  Abdominal:     General: Bowel sounds are normal.     Palpations: Abdomen is soft.  Musculoskeletal:        General: Normal range of motion.  Skin:    General: Skin is warm and dry.     Findings: No rash.  Neurological:     Mental Status: She is alert.     Motor: No abnormal muscle tone.     Coordination: Coordination normal.  Psychiatric:        Attention and Perception: She is inattentive.        Mood and Affect: Mood and affect normal.        Speech: Speech  is tangential.        Behavior: Behavior normal. Behavior is cooperative.         Assessment & Plan:   Problem List Items Addressed This Visit       Respiratory   Allergic rhinitis - Primary    Continue OTC meds (antihistamines and nose sprays) and continue Singulair      Relevant Medications   montelukast (SINGULAIR) 10 MG tablet     Digestive   GERD (gastroesophageal reflux disease)    Worse reflux symptoms trial of Protonix      Relevant Medications   pantoprazole (PROTONIX) 40 MG tablet     Other   ADHD (attention deficit hyperactivity disorder), inattentive type    Patient has endorsed her diagnosis for many years but there is no past documentation available of her assessment or past medications After thorough chart review same conclusion is reached as was reached about 5 years ago by her prior PCP and she does still need the formal assessment per psych Will refer her to [place] [facility] specialist or she can call her insurance to figure out who is in network and taking new patients for the formal assessment and medications would need to be prescribed by the specialist at least initially -she may also need other clearance for any controlled substance medications or stimulants      Relevant Orders   Ambulatory referral to Psychiatry   Adjustment disorder with mixed anxiety and depressed mood    Her PHQ-9 and GAD-7 are slightly improved she is using BuSpar  Relevant Medications   busPIRone (BUSPAR) 15 MG tablet   Generalized anxiety disorder    Symptoms are fairly well-controlled with BuSpar and other coping skills she does continue endorse her inattentiveness and ADHD also cause anxiety      Relevant Medications   busPIRone (BUSPAR) 15 MG tablet   Other Visit Diagnoses     Severe persistent asthma, unspecified whether complicated       We have tried to get her maintenance inhalers, nebulizer machines, wheeze and sx are slightly improved   Relevant  Medications   Albuterol-Budesonide (AIRSUPRA) 90-80 MCG/ACT AERO   montelukast (SINGULAIR) 10 MG tablet   Family planning services       STD screening done previously and nuvaring refills sent in   Relevant Medications   etonogestrel-ethinyl estradiol (NUVARING) 0.12-0.015 MG/24HR vaginal ring   Encounter for medication monitoring       Relevant Medications   etonogestrel-ethinyl estradiol (NUVARING) 0.12-0.015 MG/24HR vaginal ring        Return in about 6 months (around 04/28/2023) for Routine follow-up.   Danelle Berry, PA-C 10/26/22 9:43 AM

## 2022-11-11 NOTE — Assessment & Plan Note (Signed)
Symptoms are fairly well-controlled with BuSpar and other coping skills she does continue endorse her inattentiveness and ADHD also cause anxiety

## 2022-11-11 NOTE — Assessment & Plan Note (Signed)
Continue OTC meds (antihistamines and nose sprays) and continue Singulair

## 2022-11-11 NOTE — Assessment & Plan Note (Signed)
Patient has endorsed her diagnosis for many years but there is no past documentation available of her assessment or past medications After thorough chart review same conclusion is reached as was reached about 5 years ago by her prior PCP and she does still need the formal assessment per psych Will refer her to [place] [facility] specialist or she can call her insurance to figure out who is in network and taking new patients for the formal assessment and medications would need to be prescribed by the specialist at least initially -she may also need other clearance for any controlled substance medications or stimulants

## 2022-11-11 NOTE — Assessment & Plan Note (Signed)
Her PHQ-9 and GAD-7 are slightly improved she is using BuSpar

## 2022-11-11 NOTE — Assessment & Plan Note (Signed)
Worse reflux symptoms trial of Protonix

## 2022-12-31 ENCOUNTER — Other Ambulatory Visit: Payer: Self-pay | Admitting: Family Medicine

## 2022-12-31 DIAGNOSIS — K219 Gastro-esophageal reflux disease without esophagitis: Secondary | ICD-10-CM

## 2023-01-05 ENCOUNTER — Other Ambulatory Visit: Payer: Self-pay

## 2023-01-05 ENCOUNTER — Encounter: Payer: Self-pay | Admitting: Family Medicine

## 2023-01-05 DIAGNOSIS — F4323 Adjustment disorder with mixed anxiety and depressed mood: Secondary | ICD-10-CM

## 2023-01-05 DIAGNOSIS — F411 Generalized anxiety disorder: Secondary | ICD-10-CM

## 2023-01-12 ENCOUNTER — Other Ambulatory Visit: Payer: Self-pay | Admitting: Family Medicine

## 2023-01-12 ENCOUNTER — Emergency Department: Payer: Managed Care, Other (non HMO)

## 2023-01-12 ENCOUNTER — Encounter: Payer: Self-pay | Admitting: *Deleted

## 2023-01-12 ENCOUNTER — Other Ambulatory Visit: Payer: Self-pay

## 2023-01-12 ENCOUNTER — Emergency Department
Admission: EM | Admit: 2023-01-12 | Discharge: 2023-01-12 | Disposition: A | Discharge status: Home / Self Care | Payer: Managed Care, Other (non HMO) | Attending: Emergency Medicine | Admitting: Emergency Medicine

## 2023-01-12 DIAGNOSIS — G8911 Acute pain due to trauma: Secondary | ICD-10-CM | POA: Insufficient documentation

## 2023-01-12 DIAGNOSIS — M25561 Pain in right knee: Secondary | ICD-10-CM | POA: Diagnosis not present

## 2023-01-12 DIAGNOSIS — W010XXA Fall on same level from slipping, tripping and stumbling without subsequent striking against object, initial encounter: Secondary | ICD-10-CM | POA: Insufficient documentation

## 2023-01-12 DIAGNOSIS — M25562 Pain in left knee: Secondary | ICD-10-CM | POA: Insufficient documentation

## 2023-01-12 DIAGNOSIS — K219 Gastro-esophageal reflux disease without esophagitis: Secondary | ICD-10-CM

## 2023-01-12 MED ORDER — OXYCODONE-ACETAMINOPHEN 5-325 MG PO TABS
1.0000 | ORAL_TABLET | Freq: Once | ORAL | Status: AC
  Administered 2023-01-12: 1 via ORAL
  Filled 2023-01-12: qty 1

## 2023-01-12 NOTE — ED Provider Notes (Signed)
[facility] Provider Note    Event Date/Time   First MD Initiated Contact with Patient 01/12/23 1316     (approximate)   History   Knee Pain   HPI  Diane Williamson is a 32 y.o. female with PMH of ADD and asthma who presents for evaluation of bilateral knee pain after a fall today.  Patient states she tripped over a box and landed mostly on her right knee.  She states that it immediately turned blue and began to swell.  She was able to walk it first but now she feels like it is more difficult to walk due to the swelling to her knee.      Physical Exam   Triage Vital Signs: ED Triage Vitals  Encounter Vitals Group     BP 01/12/23 1241 (!) 140/97     Systolic BP Percentile --      Diastolic BP Percentile --      Pulse Rate 01/12/23 1241 90     Resp 01/12/23 1241 18     Temp 01/12/23 1241 98.7 F (37.1 C)     Temp src --      SpO2 01/12/23 1241 99 %     Weight --      Height --      Head Circumference --      Peak Flow --      Pain Score 01/12/23 1242 10     Pain Loc --      Pain Education --      Exclude from Growth Chart --     Most recent vital signs: Vitals:   01/12/23 1241  BP: (!) 140/97  Pulse: 90  Resp: 18  Temp: 98.7 F (37.1 C)  SpO2: 99%    General: Awake, no distress.  CV:  Good peripheral perfusion.  Resp:  Normal effort.  Abd:  No distention.  Right knee: Swollen with bruising just inferior to the patella, very tender to palpation surrounding the entire knee joint, patient is able to fully extend the knee but does have some difficulty flexing Left knee: Mild bruising inferior to the patella, no tenderness surrounding the knee joint, able to fully flex and extend the knee.    ED Results / Procedures / Treatments   Labs (all labs ordered are listed, but only abnormal results are displayed) Labs Reviewed - No data to display   RADIOLOGY  Bilateral knee x-rays obtained, interpreted the images as well as reviewed  the radiologist report which was negative for any fractures and dislocations.   PROCEDURES:  Critical Care performed: No  Procedures   MEDICATIONS ORDERED IN ED: Medications  oxyCODONE-acetaminophen (PERCOCET/ROXICET) 5-325 MG per tablet 1 tablet (1 tablet Oral Given 01/12/23 1335)     IMPRESSION / MDM / ASSESSMENT AND PLAN / ED COURSE  I reviewed the triage vital signs and the nursing notes.                             32 year old female presents for evaluation of bilateral knee pain after a fall earlier today.  Patient was hypertensive in triage otherwise vital signs are stable.  Patient NAD on exam.  Differential diagnosis includes, but is not limited to, knee fracture, knee dislocation, bursitis, joint effusion, contusion.  Patient's presentation is most consistent with acute complicated illness / injury requiring diagnostic workup.  Bilateral knee x-rays obtained, I interpreted the images as well as reviewed  the radiologist report, which was negative for any fractures and dislocations.  She was given Percocet to address her pain while in the ED.  Given the negative x-rays I feel she is appropriate for outpatient management.  I did advise patient on pain management at home using Tylenol and ibuprofen.  I encouraged her to ice and elevate her right knee to help with the swelling.  She may use a knee compression sleeve if she would like for comfort.  I will also give her a note for work.  She was given return precautions.  Patient was agreeable to plan, voiced understanding and all questions were answered.  Patient was stable at discharge.   FINAL CLINICAL IMPRESSION(S) / ED DIAGNOSES   Final diagnoses:  Acute pain of both knees     Rx / DC Orders   ED Discharge Orders     None        Note:  This document was prepared using Dragon voice recognition software and may include unintentional dictation errors.   Cameron Ali, PA-C 01/12/23 1550    Jene Every, MD 01/12/23 1623

## 2023-01-12 NOTE — Discharge Instructions (Addendum)
Your x-rays were negative for fracture and dislocation.  You can take 650 mg of Tylenol and 600 mg of ibuprofen every 6 hours as needed for pain.  You can also use topical pain relievers.  Please and ice and elevate your knee as needed to help with the swelling.

## 2023-01-12 NOTE — ED Triage Notes (Signed)
Pt tripped and fell on hardwood flood this am.  Pt has pain and bruising and swelling to bilateral knees R>L

## 2023-01-19 ENCOUNTER — Telehealth: Payer: Self-pay | Admitting: *Deleted

## 2023-01-19 NOTE — Progress Notes (Signed)
  Care Coordination  Outreach Note  01/19/2023 Name: ZORIANNA TALIAFERRO MRN: [id number] DOB: 26-Jan-1991   Care Coordination Outreach Attempts: An unsuccessful telephone outreach was attempted today to offer the patient information about available care coordination services.  Follow Up Plan:  Additional outreach attempts will be made to offer the patient care coordination information and services.   Encounter Outcome:  No Answer  Burman Nieves, CCMA Care Coordination Care Guide Direct Dial: [phone]

## 2023-01-20 NOTE — Progress Notes (Signed)
  Care Coordination   Note   01/20/2023 Name: Diane Williamson MRN: [id number] DOB: 15-Jan-1991  Diane Williamson is a 32 y.o. year old female who sees Danelle Berry, [place] for primary care. I reached out to Deidre Ala by phone today to offer care coordination services.  [name] was given information about Care Coordination services today including:   The Care Coordination services include support from the care team which includes your Nurse Coordinator, Clinical Social Worker, or Pharmacist.  The Care Coordination team is here to help remove barriers to the health concerns and goals most important to you. Care Coordination services are voluntary, and the patient may decline or stop services at any time by request to their care team member.   Care Coordination Consent Status: Patient agreed to services and verbal consent obtained.   Follow up plan:  Telephone appointment with care coordination team member scheduled for:  01/26/2023  Encounter Outcome:  Patient Scheduled  Burman Nieves, [facility] Care Coordination Care Guide Direct Dial: [phone]

## 2023-01-21 ENCOUNTER — Other Ambulatory Visit: Payer: Self-pay | Admitting: Family Medicine

## 2023-01-21 DIAGNOSIS — F4323 Adjustment disorder with mixed anxiety and depressed mood: Secondary | ICD-10-CM

## 2023-01-21 DIAGNOSIS — F411 Generalized anxiety disorder: Secondary | ICD-10-CM

## 2023-01-25 ENCOUNTER — Ambulatory Visit: Payer: Managed Care, Other (non HMO) | Admitting: Family Medicine

## 2023-01-26 ENCOUNTER — Ambulatory Visit: Payer: Self-pay | Admitting: *Deleted

## 2023-01-26 ENCOUNTER — Telehealth: Payer: Self-pay | Admitting: *Deleted

## 2023-01-26 NOTE — Patient Instructions (Signed)
Visit Information  Thank you for taking time to visit with me today. Please don't hesitate to contact me if I can be of assistance to you.   Following are the goals we discussed today:   Goals Addressed             This Visit's Progress    care coordination activities       Activities and task to complete in order to accomplish goals.   Call or go to Department of Social Services  [phone] for utility assistance if needed Call or go to the [employer] [phone]    for utility assistance if need EMOTIONAL / MENTAL HEALTH SUPPORT Call [facility] [phone] or [facility] [phone] to schedule your counseling appointment. Self Support options  (please continue to reach out to your natural supports for assistance and/or support when needed)         If you are experiencing a Mental Health or Behavioral Health Crisis or need someone to talk to, please call the Suicide and Crisis Lifeline: 988   Patient verbalizes understanding of instructions and care plan provided today and agrees to view in MyChart. Active MyChart status and patient understanding of how to access instructions and care plan via MyChart confirmed with patient.     No further follow up required: patient encouraged to call this Child psychotherapist with any additional community resource needs  [employer], [employer] [place]  [facility], [facility] Licensed Clinical Social Geologist, engineering Dial: [phone]

## 2023-01-26 NOTE — Patient Outreach (Addendum)
  Care Coordination   Initial Visit Note   01/26/2023 Name: Diane Williamson MRN: [id number] DOB: [birth year]  Diane Williamson is a 32 y.o. year old female who sees Diane Williamson, [place] for primary care. I spoke with  Diane Williamson by phone today.  What matters to the patients health and wellness today?  Patient states that her mood has improved-she confirms positive supports. Patient provided with community resources to assist with rental and utility payments. She is agreeable to ongoing mental health counseling, resources provided. Patient agreeable to ongoing mental health counseling-resources provided . Patient verbalized having no additional needs at this time.   Goals Addressed             This Visit's Progress    care coordination activities       Activities and task to complete in order to accomplish goals.   Call or go to Department of Social Services  [phone] for utility assistance if needed Call or go to the [employer] [phone]    for utility assistance if need EMOTIONAL / MENTAL HEALTH SUPPORT Call [facility] [phone] or [facility] [phone] to schedule your counseling appointment. Self Support options  (please continue to reach out to your natural supports for assistance and/or support when needed)         SDOH assessments and interventions completed:  Yes  SDOH Interventions Today    Flowsheet Row Most Recent Value  SDOH Interventions   Food Insecurity Interventions Intervention Not Indicated  [using the food bank for the last month]  Housing Interventions Intervention Not Indicated  Transportation Interventions Intervention Not Indicated  Utilities Interventions Intervention Not Indicated  [worked out a payment plan]  Financial Strain Interventions Intervention Not Indicated        Care Coordination Interventions:  Yes, provided  Interventions Today    Flowsheet Row Most Recent Value  Chronic Disease   Chronic disease during  today's visit Other  [emmi referral for sadness/depression acute bilateral knee pain following a fall]  General Interventions   General Interventions Discussed/Reviewed General Interventions Discussed, Programmer, applications  [provided resources for crisis funds for [employer] and Environmental health practitioner and the Department of Engineer, site) encouraged to call when needed]  Mental Health Interventions   Mental Health Discussed/Reviewed Mental Health Discussed, Coping Strategies, Depression  [pt confirmed symptoms of depression following fall, now reports imprve in moof-injuries are beginning to resolve-pt confirm having  pos supports -agreeable to mental health follow up-mental health resources provided-referral pending for [facility]]  Safety Interventions   Safety Discussed/Reviewed Safety Discussed  [patient denies thoughts of harm to self or others]       Follow up plan: No further intervention required.   Encounter Outcome:  Patient Visit Completed

## 2023-01-26 NOTE — Patient Outreach (Signed)
  Care Coordination   01/26/2023 Name: Diane Williamson MRN: [id number] DOB: [birth year]   Care Coordination Outreach Attempts:  An unsuccessful telephone outreach was attempted for a scheduled appointment today.  Follow Up Plan:  Additional outreach attempts will be made to offer the patient care coordination information and services.   Encounter Outcome:  No Answer   Care Coordination Interventions:  No, not indicated    Caelen Reierson, LCSW [place]  [facility], [facility] Licensed Clinical Social Worker Care Coordinator  Direct Dial: [phone]

## 2023-02-23 ENCOUNTER — Ambulatory Visit: Payer: Managed Care, Other (non HMO) | Admitting: Physician Assistant

## 2023-02-23 ENCOUNTER — Other Ambulatory Visit: Payer: Self-pay | Admitting: Nurse Practitioner

## 2023-02-23 ENCOUNTER — Encounter: Payer: Self-pay | Admitting: Physician Assistant

## 2023-02-23 ENCOUNTER — Other Ambulatory Visit: Payer: Self-pay | Admitting: Family Medicine

## 2023-02-23 ENCOUNTER — Encounter: Payer: Self-pay | Admitting: Family Medicine

## 2023-02-23 ENCOUNTER — Ambulatory Visit: Payer: Self-pay | Admitting: *Deleted

## 2023-02-23 VITALS — BP 118/80 | HR 90 | Resp 16 | Ht 70.0 in | Wt 281.7 lb

## 2023-02-23 DIAGNOSIS — F411 Generalized anxiety disorder: Secondary | ICD-10-CM

## 2023-02-23 DIAGNOSIS — F331 Major depressive disorder, recurrent, moderate: Secondary | ICD-10-CM | POA: Diagnosis not present

## 2023-02-23 DIAGNOSIS — J4550 Severe persistent asthma, uncomplicated: Secondary | ICD-10-CM

## 2023-02-23 MED ORDER — DULOXETINE HCL 30 MG PO CPEP: 30.0000 mg | ORAL_CAPSULE | Freq: Every day | ORAL | 0 refills | Status: DC

## 2023-02-23 NOTE — Telephone Encounter (Signed)
  Chief Complaint: feelings of depression , requesting to restart medication Symptoms: job closing down and feelings of depression, not sleeping, no energy sadness, difficulty concentrating, hopelessness Frequency: 1 week Pertinent Negatives: Patient denies hurting self or others  Disposition: [] ED /[] Urgent Care (no appt availability in office) / [x] Appointment(In office/virtual)/ []  [place] [facility]/ [] Home Care/ [] Refused Recommended Disposition /[] [place] [facility]/ []  Follow-up with PCP Additional Notes:   No appt with PCP available. Scheduled with other provider in office today     Reason for Disposition  [1] Depression AND [2] worsening (e.g., sleeping poorly, less able to do activities of daily living)  Answer Assessment - Initial Assessment Questions 1. CONCERN: "What happened that made you call today?"     Job closing down concerned regarding money  2. DEPRESSION SYMPTOM SCREENING: "How are you feeling overall?" (e.g., decreased energy, increased sleeping or difficulty sleeping, difficulty concentrating, feelings of sadness, guilt, hopelessness, or worthlessness)     Decreased energy , sleeping issues, sadness, hopelessness, difficulty concentrating  3. RISK OF HARM - SUICIDAL IDEATION:  "Do you ever have thoughts of hurting or killing yourself?"  (e.g., yes, no, no but preoccupation with thoughts about death)   - INTENT:  "Do you have thoughts of hurting or killing yourself right NOW?" (e.g., yes, no, N/A)   - PLAN: "Do you have a specific plan for how you would do this?" (e.g., gun, knife, overdose, no plan, N/A)     no 4. RISK OF HARM - HOMICIDAL IDEATION:  "Do you ever have thoughts of hurting or killing someone else?"  (e.g., yes, no, no but preoccupation with thoughts about death)   - INTENT:  "Do you have thoughts of hurting or killing someone right NOW?" (e.g., yes, no, N/A)   - PLAN: "Do you have a specific plan for how you would do this?" (e.g., gun, knife, no  plan, N/A)      na 5. FUNCTIONAL IMPAIRMENT: "How have things been going for you overall? Have you had more difficulty than usual doing your normal daily activities?"  (e.g., better, same, worse; self-care, school, work, interactions)     Feeling more depressed  6. SUPPORT: "Who is with you now?" "Who do you live with?" "Do you have family or friends who you can talk to?"      na 7. THERAPIST: "Do you have a counselor or therapist? Name?"     na 8. STRESSORS: "Has there been any new stress or recent changes in your life?"     Losing job 9. ALCOHOL USE OR SUBSTANCE USE (DRUG USE): "Do you drink alcohol or use any illegal drugs?"     na 10. OTHER: "Do you have any other physical symptoms right now?" (e.g., fever)       Na  11. PREGNANCY: "Is there any chance you are pregnant?" "When was your last menstrual period?"       na  Protocols used: Depression-A-AH

## 2023-02-23 NOTE — Telephone Encounter (Signed)
Appt today

## 2023-02-23 NOTE — Progress Notes (Signed)
Established Patient Office Visit  Name: Diane Williamson   MRN: [id number]    DOB: 1990-08-13   Date:02/24/2023  Today's Provider: Jacquelin Hawking, MHS, PA-C Introduced myself to the patient as a PA-C and provided education on APPs in clinical practice.         Subjective  Chief Complaint  Chief Complaint  Patient presents with   Depression    Start new med    HPI   Depression/ anxiety   She reports feeling depressed She has significant job stress right now- her department is being eliminated and she is skeptical that she will be able to find work She was previously on Zoloft but reports this did not provide adequate relief despite high dose She is still in therapy services- thinks she will switch to [employer]  She denies thoughts of hurting herself today- she does have a hx self harm when she was younger She reports these thoughts have resolved in the past with medication   She reports anxiety is ongoing concern for her as well - she states she typically has anxiety almost every day   She reports difficulty with sleeping  She works 3rd shift and has some issues with falling asleep  She uses blackout curtains but has difficulty with racing thoughts  She is not sure if she has been prescribed anything to help with sleep in the past  Has tried Melatonin but this causes next day symptoms (feels hung over)       02/23/2023   11:20 AM 01/26/2023    9:55 AM 10/26/2022    9:16 AM 09/23/2022   10:10 AM 03/30/2022    1:16 PM  Depression screen PHQ 2/9  Decreased Interest 3 0 0 0 2  Down, Depressed, Hopeless 3 0 0 0 2  PHQ - 2 Score 6 0 0 0 4  Altered sleeping 3  0 0 3  Tired, decreased energy 3  0 0 3  Change in appetite 0  0 0 2  Feeling bad or failure about yourself  2  0 0 1  Trouble concentrating 3  0 0 2  Moving slowly or fidgety/restless 0  0 0 2  Suicidal thoughts 0  0 0 1  PHQ-9 Score 17  0 0 18  Difficult doing work/chores Very difficult  Not difficult  at all  Very difficult      02/23/2023   11:20 AM 10/26/2022    9:22 AM 03/30/2022    1:23 PM 06/10/2021    9:32 AM  GAD 7 : Generalized Anxiety Score  Nervous, Anxious, on Edge 2 1 3    Control/stop worrying 2 1 3    Worry too much - different things 2 1 3    Trouble relaxing 2 0 2   Restless 2 0 3   Easily annoyed or irritable 2 0 2   Afraid - awful might happen 2 0 1   Total GAD 7 Score 14 3 17    Anxiety Difficulty Very difficult Somewhat difficult Very difficult      Information is confidential and restricted. Go to Review Flowsheets to unlock data.      Patient Active Problem List   Diagnosis Date Noted   Moderate episode of recurrent major depressive disorder (HCC) 04/06/2022   Obesity, Class III, BMI 40-49.9 (morbid obesity) (HCC) 03/30/2022   Generalized anxiety disorder 12/30/2019   Depression with anxiety 12/30/2019   Chronic bilateral low back pain without sciatica  12/30/2019   Psychophysiological insomnia 12/30/2019   Carpal tunnel syndrome of right wrist 11/16/2019   Dysplasia of cervix, low grade (CIN 1) 07/04/2019   Chronic venous insufficiency 09/03/2018   Eczema 07/03/2018   Allergic rhinitis 07/03/2018   GERD (gastroesophageal reflux disease) 07/03/2018   Vitamin D deficiency 07/03/2018   Obesity (BMI 35.0-39.9 without comorbidity) 06/07/2018   Anemia 06/07/2018   Hematuria, microscopic 06/07/2018   ADHD (attention deficit hyperactivity disorder), 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/2018   Varicose veins of both lower extremities with pain 06/07/2018   Adjustment disorder with mixed anxiety and depressed mood 10/10/2017   Personal history of nonsuicidal self-harm 08/29/2017   Vitiligo 12/08/2010    Past Surgical History:  Procedure Laterality Date   NO PAST SURGERIES      Family History  Problem Relation Age of Onset   Heart disease Mother    Hypertension Mother    ADD / ADHD Father    Hypertension Father    Cancer Maternal Grandmother    Heart disease  Maternal Grandmother    Diabetes Maternal Grandmother    Ovarian cancer Maternal Grandmother    Diabetes Maternal Grandfather    Diabetes Paternal Grandmother    Atrial fibrillation Paternal Grandmother    AAA (abdominal aortic aneurysm) Paternal Grandmother    Heart disease Paternal Grandmother    Heart attack Paternal Grandfather    Varicose Veins Paternal Grandfather    Breast cancer Neg Hx     Social History   Tobacco Use   Smoking status: Former    Current packs/day: 0.00    Average packs/day: 0.5 packs/day for 7.0 years (3.5 ttl pk-yrs)    Types: Cigarettes    Start date: 01/16/2009    Quit date: 01/17/2016    Years since quitting: 7.1   Smokeless tobacco: Never  Substance Use Topics   Alcohol use: Yes    Alcohol/week: 2.0 standard drinks of alcohol    Types: 2 Glasses of wine per week     Current Outpatient Medications:    acyclovir (ZOVIRAX) 800 MG tablet, Take 1 tablet (800 mg total) by mouth 3 (three) times daily as needed (for 2 days take at onset of herpes outbreak)., Disp: 30 tablet, Rfl: 11   Albuterol-Budesonide (AIRSUPRA) 90-80 MCG/ACT AERO, INHALE 2 PUFFS INTO THE LUNGS EVERY 4 (FOUR) HOURS AS NEEDED FOR SHORTNESS OF BREATH/WHEEZING, Disp: 10.7 g, Rfl: 3   busPIRone (BUSPAR) 15 MG tablet, Take 1 tablet (15 mg total) by mouth 3 (three) times daily as needed (anxiety stress)., Disp: 90 tablet, Rfl: 11   DULoxetine (CYMBALTA) 30 MG capsule, Take 1 capsule (30 mg total) by mouth daily., Disp: 60 capsule, Rfl: 0   etonogestrel-ethinyl estradiol (NUVARING) 0.12-0.015 MG/24HR vaginal ring, Insert vaginally and leave in place for 3 consecutive weeks, then remove for 1 week., Disp: 3 each, Rfl: 3   ipratropium-albuterol (DUONEB) 0.5-2.5 (3) MG/3ML SOLN, Take 3 mLs by nebulization every 4 (four) hours as needed., Disp: 360 mL, Rfl: 0   montelukast (SINGULAIR) 10 MG tablet, Take 1 tablet (10 mg total) by mouth at bedtime., Disp: 30 tablet, Rfl: 5  Allergies  Allergen  Reactions   Benadryl Allergy [Diphenhydramine Hcl]     Rapid heart rate.    I personally reviewed active problem list, medication list, allergies, health maintenance, notes from last encounter, lab results with the patient/caregiver today.   Review of Systems  Psychiatric/Behavioral:  Positive for depression. The patient is nervous/anxious and has insomnia.  Objective  Vitals:   02/23/23 1120  BP: 118/80  Pulse: 90  Resp: 16  SpO2: 98%  Weight: 281 lb 11.2 oz (127.8 kg)  Height: 5\' 10"  (1.778 m)    Body mass index is 40.42 kg/m.  Physical Exam Vitals reviewed.  Constitutional:      Appearance: Normal appearance.  HENT:     Head: Normocephalic and atraumatic.  Eyes:     Extraocular Movements: Extraocular movements intact.     Conjunctiva/sclera: Conjunctivae normal.     Pupils: Pupils are equal, round, and reactive to light.  Pulmonary:     Effort: Pulmonary effort is normal.  Musculoskeletal:     Cervical back: Normal range of motion.  Neurological:     General: No focal deficit present.     Mental Status: She is alert and oriented to person, place, and time.  Psychiatric:        Mood and Affect: Mood normal.        Behavior: Behavior normal.        Thought Content: Thought content normal.        Judgment: Judgment normal.      No results found for this or any previous visit (from the past 2160 hour(s)).   PHQ2/9:    02/23/2023   11:20 AM 01/26/2023    9:55 AM 10/26/2022    9:16 AM 09/23/2022   10:10 AM 03/30/2022    1:16 PM  Depression screen PHQ 2/9  Decreased Interest 3 0 0 0 2  Down, Depressed, Hopeless 3 0 0 0 2  PHQ - 2 Score 6 0 0 0 4  Altered sleeping 3  0 0 3  Tired, decreased energy 3  0 0 3  Change in appetite 0  0 0 2  Feeling bad or failure about yourself  2  0 0 1  Trouble concentrating 3  0 0 2  Moving slowly or fidgety/restless 0  0 0 2  Suicidal thoughts 0  0 0 1  PHQ-9 Score 17  0 0 18  Difficult doing work/chores Very  difficult  Not difficult at all  Very difficult      Fall Risk:    02/23/2023   11:19 AM 10/26/2022    9:16 AM 09/23/2022   10:10 AM 03/30/2022    1:16 PM 12/21/2021    2:50 PM  Fall Risk   Falls in the past year? 1 0 0 0 0  Number falls in past yr: 1 0 0 0 0  Injury with Fall? 1 0 0 0 0  Risk for fall due to : Impaired balance/gait No Fall Risks No Fall Risks No Fall Risks   Follow up Falls prevention discussed;Education provided;Falls evaluation completed Falls prevention discussed;Education provided;Falls evaluation completed Falls prevention discussed Falls prevention discussed;Education provided;Falls evaluation completed Falls evaluation completed      Functional Status Survey: Is the patient deaf or have difficulty hearing?: No Does the patient have difficulty seeing, even when wearing glasses/contacts?: No Does the patient have difficulty concentrating, remembering, or making decisions?: No Does the patient have difficulty walking or climbing stairs?: No Does the patient have difficulty dressing or bathing?: No Does the patient have difficulty doing errands alone such as visiting a doctor's office or shopping?: No    Assessment & Plan  Problem List Items Addressed This Visit       Other   Generalized anxiety disorder    Chronic, ongoing Appears currently exacerbated due to recent work stressors Will  try starting Cymbalta 30 mg p.o. daily to assist with depression and anxiety symptoms  may need to evaluate sleep issues as well at follow-up She is currently engaged with therapy services and plans to continue this relationship Recommend follow-up in about 6 weeks or sooner if concerns arise      Relevant Medications   DULoxetine (CYMBALTA) 30 MG capsule   Moderate episode of recurrent major depressive disorder (HCC) - Primary    Chronic, historic condition Currently exacerbated She reports persistent depression and anxiety due to work-related stressors. She is  currently active in therapy.  She does have plans to [facility] to [facility]. She reports previously trying Zoloft but this did not provide much relief despite rather high dose. After reviewing PHQ-9 and having thorough discussion with patient regarding SSRI versus SNRI medication classes, most common side effects, mechanism of action we decided to try SNRI and she was prescribed on duloxetine 30 mg p.o. daily She does report significant history of sleep issues, mainly sleep initiation due to racing thoughts May need to try trazodone to assist with this and also provide mild mood stabilization She denies current SI. Reviewed ED and return precautions Recommend follow-up in about 6 weeks or sooner if concerns arise      Relevant Medications   DULoxetine (CYMBALTA) 30 MG capsule     Return in about 6 weeks (around 04/06/2023) for Depression, anxiety.   I, Myha Arizpe E Janett Kamath, PA-C, have reviewed all documentation for this visit. The documentation on 02/24/23 for the exam, diagnosis, procedures, and orders are all accurate and complete.   Jacquelin Hawking, MHS, PA-C [facility]

## 2023-02-23 NOTE — Patient Instructions (Addendum)
Sleep hygiene (Practices to help improve sleep)  *Try to go to bed at the same time every night *Make your room as dark and quiet as possible for sleep (ambient noises or sleep machine is okay too) *Try to establish a bedtime routine before bed (shower, reading for a little bit, etc.) *No TVs or phone for at least 30min to an hour before bed. (The blue light from these devices can affect your sleep-wake cycle and make it harder to go to sleep) *Avoid caffeine in the afternoon and especially before bedtime.  The goal is to establish a relaxing environment and routine that signals your body that it is time to go to sleep.    

## 2023-02-24 ENCOUNTER — Ambulatory Visit: Payer: Self-pay | Admitting: *Deleted

## 2023-02-24 NOTE — Telephone Encounter (Signed)
Pt verbalized understanding.

## 2023-02-24 NOTE — Telephone Encounter (Signed)
  Chief Complaint: Took first dose of Cymbalta 30 mg last night.   Symptoms: Could not sleep     "I'm feeling wired" Frequency: All night Pertinent Negatives: Patient denies other symptoms. Disposition: [] ED /[] Urgent Care (no appt availability in office) / [] Appointment(In office/virtual)/ []  [place] [facility]/ [] Home Care/ [] Refused Recommended Disposition /[] [place] [facility]/ [x]  Follow-up with PCP Additional Notes: Saw Erin Mecum, PA-C yesterday.   Took first dose of Cymbalta last night.    Has tried Zoloft but it stopped working.    Message sent to [facility].   Pt. Agreeable to someone calling her back.

## 2023-02-24 NOTE — Assessment & Plan Note (Signed)
Chronic, ongoing Appears currently exacerbated due to recent work stressors Will try starting Cymbalta 30 mg p.o. daily to assist with depression and anxiety symptoms  may need to evaluate sleep issues as well at follow-up She is currently engaged with therapy services and plans to continue this relationship Recommend follow-up in about 6 weeks or sooner if concerns arise

## 2023-02-24 NOTE — Assessment & Plan Note (Signed)
Chronic, historic condition Currently exacerbated She reports persistent depression and anxiety due to work-related stressors. She is currently active in therapy.  She does have plans to [facility] to [facility]. She reports previously trying Zoloft but this did not provide much relief despite rather high dose. After reviewing PHQ-9 and having thorough discussion with patient regarding SSRI versus SNRI medication classes, most common side effects, mechanism of action we decided to try SNRI and she was prescribed on duloxetine 30 mg p.o. daily She does report significant history of sleep issues, mainly sleep initiation due to racing thoughts May need to try trazodone to assist with this and also provide mild mood stabilization She denies current SI. Reviewed ED and return precautions Recommend follow-up in about 6 weeks or sooner if concerns arise

## 2023-02-24 NOTE — Telephone Encounter (Signed)
Reason for Disposition  [1] Caller has URGENT medicine question about med that PCP or specialist prescribed AND [2] triager unable to answer question    Cymbalta 30 mg is making her feel very wired.   Unable to sleep.   Took first dose last night.  Saw Jacquelin Hawking, PA-C yesterday 11/7  Answer Assessment - Initial Assessment Questions 1. NAME of MEDICINE: "What medicine(s) are you calling about?"     Cymbalta 30 mg 2. QUESTION: "What is your question?" (e.g., double dose of medicine, side effect)     It kept me up all night and I'm wired.   I started it yesterday.  I took my first dose last night. I've taken Zoloft before but it stopped working so that's why she is trying me on something different. 3. PRESCRIBER: "Who prescribed the medicine?" Reason: if prescribed by specialist, call should be referred to that group.     Erin Mecum, PA-C 4. SYMPTOMS: "Do you have any symptoms?" If Yes, ask: "What symptoms are you having?"  "How bad are the symptoms (e.g., mild, moderate, severe)     Could not sleep last night because "I'm feeling so wired". 5. PREGNANCY:  "Is there any chance that you are pregnant?" "When was your last menstrual period?"     Not asked  Protocols used: Medication Question Call-A-AH

## 2023-03-01 ENCOUNTER — Ambulatory Visit: Payer: Self-pay

## 2023-03-01 NOTE — Telephone Encounter (Signed)
Chief Complaint: Medication reaction to duloxetine Symptoms: Panicky, increased anxiety Frequency: Since starting medication Pertinent Negatives: Patient denies other symptoms Disposition: [] ED /[] Urgent Care (no appt availability in office) / [] Appointment(In office/virtual)/ []  [place] [facility]/ [] Home Care/ [] Refused Recommended Disposition /[]  Mobile Bus/ [x]  Follow-up with PCP Additional Notes: Patient says she has tried to take the duloxetine during the day as per Denny Peon, but she says she's still feeling more anxious and panicky after taking it. She says she's taken 2 doses and just stopped it altogether. She says during the increased anxiety she took her buspirone to call down, but it didn't help her. She says she felt like this for about 6 hours, then it subsided. Advised I will send this to Denny Peon and someone will call with her recommendation. Patient verbalized understanding.    Summary: Medication Reaction   Pt is calling in because she was prescribed DULoxetine (CYMBALTA) 30 MG capsule [[id number]] and she believes she has been having side effects from the medication. Pt says when she takes the medication it makes her anxiety 10x worse and she gets extremely antsy and panicky.     Reason for Disposition . [1] Caller has NON-URGENT medicine question about med that PCP prescribed AND [2] triager unable to answer question  Answer Assessment - Initial Assessment Questions 1. NAME of MEDICINE: "What medicine(s) are you calling about?"     Duloxetine  2. QUESTION: "What is your question?" (e.g., double dose of medicine, side effect)     Believes it is causing her to have increased anxiety, not working 3. PRESCRIBER: "Who prescribed the medicine?" Reason: if prescribed by specialist, call should be referred to that group.     Erin Mecum, PA-C 4. SYMPTOMS: "Do you have any symptoms?" If Yes, ask: "What symptoms are you having?"  "How bad are the symptoms (e.g., mild,  moderate, severe)     Panic attack worsens x 6 hours after taking  Protocols used: Medication Question Call-A-AH

## 2023-03-02 ENCOUNTER — Telehealth: Payer: Managed Care, Other (non HMO) | Admitting: Physician Assistant

## 2023-03-02 DIAGNOSIS — F331 Major depressive disorder, recurrent, moderate: Secondary | ICD-10-CM

## 2023-03-02 DIAGNOSIS — F411 Generalized anxiety disorder: Secondary | ICD-10-CM | POA: Diagnosis not present

## 2023-03-02 MED ORDER — ESCITALOPRAM OXALATE 10 MG PO TABS
10.0000 mg | ORAL_TABLET | Freq: Every day | ORAL | 1 refills | Status: DC
Start: 2023-03-02 — End: 2023-04-07

## 2023-03-02 NOTE — Assessment & Plan Note (Signed)
Chronic, historic condition Appears exacerbated Reviewed PHQ9 and GAD7 today- she reports some improvement to depression symptoms with Duloxetine but this was too activating for her anxiety so it was stopped She reports duloxetine prevented her from sleeping as well which further exacerbated her mood concerns Will try SSRI class  Script sent for Lexapro 10 mg PO every day  Continue Buspar as directed  Keep upcoming follow up in Dec to review response or come in sooner if concerns arise

## 2023-03-02 NOTE — Telephone Encounter (Signed)
Recommend she stops the Duloxetine. We can schedule a virtual to discuss an alternative medication if this is easier for her or office apt per preference

## 2023-03-02 NOTE — Telephone Encounter (Signed)
Pt is scheduled °

## 2023-03-02 NOTE — Telephone Encounter (Signed)
Pt aware, transferred to [facility] to schedule virtual visit.

## 2023-03-02 NOTE — Progress Notes (Signed)
Virtual Visit via Video Note  I connected with Diane Williamson on 03/02/23 at  1:00 PM EST by a video enabled telemedicine application and verified that I am speaking with the correct person using two identifiers.  Today's Provider: Jacquelin Hawking, MHS, PA-C Introduced myself to the patient as a PA-C and provided education on APPs in clinical practice.   Location: Patient: at home Provider: [facility], [place], [place]    I discussed the limitations of evaluation and management by telemedicine and the availability of in person appointments. The patient expressed understanding and agreed to proceed.  Chief Complaint  Patient presents with   Depression    she's still feeling more anxious and panicky after taking it. She says she's taken 2 doses and just stopped it altogether. She says during the increased anxiety she took her buspirone to call down, but it didn't help her. She says she felt like this for about 6 hours, then it subsided. Has not taken any today    History of Present Illness:  She reports that Duloxetine made her anxiety worse after trying it for a few doses She reports that it did help with depression symptoms but her anxiety was not well managed at all She reports taking 2 dose so far - reports no decrease to anxiety symptoms with second dose and she states she was not able to sleep at all after taking  She has tried Zoloft in the past - reports minimal improvement as she was on it for a while  She reports anxiety is manageable right now   She thinks Buspar is at a good dose for now       03/02/2023   10:27 AM 02/23/2023   11:20 AM 01/26/2023    9:55 AM 10/26/2022    9:16 AM 09/23/2022   10:10 AM  Depression screen PHQ 2/9  Decreased Interest 3 3 0 0 0  Down, Depressed, Hopeless 3 3 0 0 0  PHQ - 2 Score 6 6 0 0 0  Altered sleeping 3 3  0 0  Tired, decreased energy 3 3  0 0  Change in appetite 3 0  0 0  Feeling bad or failure about yourself  3 2   0 0  Trouble concentrating 0 3  0 0  Moving slowly or fidgety/restless 0 0  0 0  Suicidal thoughts 0 0  0 0  PHQ-9 Score 18 17  0 0  Difficult doing work/chores Very difficult Very difficult  Not difficult at all       03/02/2023   10:27 AM 02/23/2023   11:20 AM 10/26/2022    9:22 AM 03/30/2022    1:23 PM  GAD 7 : Generalized Anxiety Score  Nervous, Anxious, on Edge 3 2 1 3   Control/stop worrying 3 2 1 3   Worry too much - different things 3 2 1 3   Trouble relaxing 3 2 0 2  Restless 3 2 0 3  Easily annoyed or irritable 3 2 0 2  Afraid - awful might happen 3 2 0 1  Total GAD 7 Score 21 14 3 17   Anxiety Difficulty Extremely difficult Very difficult Somewhat difficult Very difficult         Review of Systems  Psychiatric/Behavioral:  Positive for depression. The patient is nervous/anxious and has insomnia.     Observations/Objective:  Due to the nature of the virtual visit, physical exam and observations are limited. Able to obtain the following observations:  Alert, oriented, x3 Appears comfortable, in no acute distress.  No scleral injection, no appreciated hoarseness, tachypnea, wheeze or strider. Able to maintain conversation without visible strain.  No cough appreciated during visit.    Assessment and Plan: Problem List Items Addressed This Visit       Other   Generalized anxiety disorder    Chronic, currently exacerbated from recent medication trial  She reports anxiety is returning to previous levels but that Duloxetine was too activating for her anxiety Reviewed GAD7 which was up to 21 today and PHQ9 which is overall stable  Will try SSRI for now after shared decision making Sent in script for Lexapro 10 mg PO every day and reviewed most likely side effects  Continue Buspar as directed  She has follow up scheduled for 04/07/23- keep this apt or come in sooner if concerns arise       Relevant Medications   escitalopram (LEXAPRO) 10 MG tablet   Moderate  episode of recurrent major depressive disorder (HCC) - Primary    Chronic, historic condition Appears exacerbated Reviewed PHQ9 and GAD7 today- she reports some improvement to depression symptoms with Duloxetine but this was too activating for her anxiety so it was stopped She reports duloxetine prevented her from sleeping as well which further exacerbated her mood concerns Will try SSRI class  Script sent for Lexapro 10 mg PO every day  Continue Buspar as directed  Keep upcoming follow up in Dec to review response or come in sooner if concerns arise       Relevant Medications   escitalopram (LEXAPRO) 10 MG tablet   Follow Up Instructions:    I discussed the assessment and treatment plan with the patient. The patient was provided an opportunity to ask questions and all were answered. The patient agreed with the plan and demonstrated an understanding of the instructions.   The patient was advised to call back or seek an in-person evaluation if the symptoms worsen or if the condition fails to improve as anticipated.  I provided 9 minutes of non-face-to-face time during this encounter.  Return for as scheduled or sooner for concerns.   I, Laya Letendre E Sugey Trevathan, PA-C, have reviewed all documentation for this visit. The documentation on 03/02/23 for the exam, diagnosis, procedures, and orders are all accurate and complete.   Jacquelin Hawking, MHS, PA-C [facility]

## 2023-03-02 NOTE — Assessment & Plan Note (Signed)
Chronic, currently exacerbated from recent medication trial  She reports anxiety is returning to previous levels but that Duloxetine was too activating for her anxiety Reviewed GAD7 which was up to 21 today and PHQ9 which is overall stable  Will try SSRI for now after shared decision making Sent in script for Lexapro 10 mg PO every day and reviewed most likely side effects  Continue Buspar as directed  She has follow up scheduled for 04/07/23- keep this apt or come in sooner if concerns arise

## 2023-03-06 ENCOUNTER — Other Ambulatory Visit: Payer: Self-pay | Admitting: Family Medicine

## 2023-03-06 DIAGNOSIS — J4550 Severe persistent asthma, uncomplicated: Secondary | ICD-10-CM

## 2023-03-06 DIAGNOSIS — J3089 Other allergic rhinitis: Secondary | ICD-10-CM

## 2023-03-24 ENCOUNTER — Other Ambulatory Visit: Payer: Self-pay | Admitting: Physician Assistant

## 2023-03-24 DIAGNOSIS — F411 Generalized anxiety disorder: Secondary | ICD-10-CM

## 2023-03-24 DIAGNOSIS — F331 Major depressive disorder, recurrent, moderate: Secondary | ICD-10-CM

## 2023-03-27 NOTE — Telephone Encounter (Signed)
Requested Prescriptions  Refused Prescriptions Disp Refills   escitalopram (LEXAPRO) 10 MG tablet [Pharmacy Med Name: ESCITALOPRAM 10 MG TABLET] 90 tablet 1    Sig: TAKE 1 TABLET BY MOUTH EVERY DAY     Psychiatry:  Antidepressants - SSRI Passed - 03/24/2023 12:32 PM      Passed - Completed PHQ-2 or PHQ-9 in the last 360 days      Passed - Valid encounter within last 6 months    Recent Outpatient Visits           3 weeks ago Moderate episode of recurrent major depressive disorder (HCC)   [facility] Mecum, Erin E, PA-C   1 month ago Moderate episode of recurrent major depressive disorder (HCC)   [facility] Mecum, Erin E, PA-C   5 months ago Allergic rhinitis due to other allergic trigger, unspecified seasonality   [facility], Sheliah Mends, PA-C   6 months ago Adjustment disorder with mixed anxiety and depressed mood   [facility] Danelle Berry, PA-C   12 months ago Generalized anxiety disorder   [facility] Danelle Berry, PA-C       Future Appointments             In 1 week Mecum, Oswaldo Conroy, PA-C [facility]

## 2023-04-07 ENCOUNTER — Encounter: Payer: Self-pay | Admitting: Physician Assistant

## 2023-04-07 ENCOUNTER — Ambulatory Visit: Payer: Managed Care, Other (non HMO) | Admitting: Physician Assistant

## 2023-04-07 VITALS — BP 128/74 | HR 91 | Resp 16 | Ht 70.0 in | Wt 289.0 lb

## 2023-04-07 DIAGNOSIS — F411 Generalized anxiety disorder: Secondary | ICD-10-CM

## 2023-04-07 DIAGNOSIS — F331 Major depressive disorder, recurrent, moderate: Secondary | ICD-10-CM | POA: Diagnosis not present

## 2023-04-07 DIAGNOSIS — F5104 Psychophysiologic insomnia: Secondary | ICD-10-CM | POA: Diagnosis not present

## 2023-04-07 DIAGNOSIS — F4323 Adjustment disorder with mixed anxiety and depressed mood: Secondary | ICD-10-CM

## 2023-04-07 MED ORDER — TRAZODONE HCL 50 MG PO TABS
25.0000 mg | ORAL_TABLET | Freq: Every evening | ORAL | 1 refills | Status: DC | PRN
Start: 2023-04-07 — End: 2023-06-08

## 2023-04-07 MED ORDER — ESCITALOPRAM OXALATE 10 MG PO TABS
10.0000 mg | ORAL_TABLET | Freq: Every day | ORAL | 1 refills | Status: DC
Start: 2023-04-07 — End: 2023-11-14

## 2023-04-07 NOTE — Progress Notes (Unsigned)
Established Patient Office Visit  Name: Diane Williamson   MRN: [id number]    DOB: 07-18-1990   Date:04/08/2023  Today's Provider: Jacquelin Hawking, MHS, PA-C Introduced myself to the patient as a PA-C and provided education on APPs in clinical practice.         Subjective  Chief Complaint  Chief Complaint  Patient presents with   Medical Management of Chronic Issues   Depression   Anxiety    HPI   Mood Concerns   She reports significant improvement in mood- states she is no longer waking up and crying Reports increased energy and thinks she is sleeping better States she is sleeping about 6 hours but does wake up once or twice during the night  She is not having to use Buspar at all at this time   She reports she still has down days but they are manageable- these occur 1-3 times per week - mostly feels increased sadness She denies SI or plan today       04/07/2023   10:28 AM 03/02/2023   10:27 AM 02/23/2023   11:20 AM 01/26/2023    9:55 AM 10/26/2022    9:16 AM  Depression screen PHQ 2/9  Decreased Interest 0 3 3 0 0  Down, Depressed, Hopeless 0 3 3 0 0  PHQ - 2 Score 0 6 6 0 0  Altered sleeping 0 3 3  0  Tired, decreased energy 0 3 3  0  Change in appetite 0 3 0  0  Feeling bad or failure about yourself  0 3 2  0  Trouble concentrating 0 0 3  0  Moving slowly or fidgety/restless 0 0 0  0  Suicidal thoughts 0 0 0  0  PHQ-9 Score 0 18 17  0  Difficult doing work/chores  Very difficult Very difficult  Not difficult at all       04/07/2023   10:29 AM 03/02/2023   10:27 AM 02/23/2023   11:20 AM 10/26/2022    9:22 AM  GAD 7 : Generalized Anxiety Score  Nervous, Anxious, on Edge 1 3 2 1   Control/stop worrying 0 3 2 1   Worry too much - different things 0 3 2 1   Trouble relaxing 0 3 2 0  Restless 0 3 2 0  Easily annoyed or irritable 0 3 2 0  Afraid - awful might happen 0 3 2 0  Total GAD 7 Score 1 21 14 3   Anxiety Difficulty  Extremely difficult Very  difficult Somewhat difficult      Patient Active Problem List   Diagnosis Date Noted   Moderate episode of recurrent major depressive disorder (HCC) 04/06/2022   Obesity, Class III, BMI 40-49.9 (morbid obesity) (HCC) 03/30/2022   Generalized anxiety disorder 12/30/2019   Depression with anxiety 12/30/2019   Chronic bilateral low back pain without sciatica 12/30/2019   Psychophysiological insomnia 12/30/2019   Carpal tunnel syndrome of right wrist 11/16/2019   Dysplasia of cervix, low grade (CIN 1) 07/04/2019   Chronic venous insufficiency 09/03/2018   Eczema 07/03/2018   Allergic rhinitis 07/03/2018   GERD (gastroesophageal reflux disease) 07/03/2018   Vitamin D deficiency 07/03/2018   Obesity (BMI 35.0-39.9 without comorbidity) 06/07/2018   Anemia 06/07/2018   Hematuria, microscopic 06/07/2018   ADHD (attention deficit hyperactivity disorder), 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/2018   Varicose veins of both lower extremities with pain 06/07/2018   Adjustment disorder with mixed anxiety and depressed  mood 10/10/2017   Personal history of nonsuicidal self-harm 08/29/2017   Vitiligo 12/08/2010    Past Surgical History:  Procedure Laterality Date   NO PAST SURGERIES      Family History  Problem Relation Age of Onset   Heart disease Mother    Hypertension Mother    ADD / ADHD Father    Hypertension Father    Cancer Maternal Grandmother    Heart disease Maternal Grandmother    Diabetes Maternal Grandmother    Ovarian cancer Maternal Grandmother    Diabetes Maternal Grandfather    Diabetes Paternal Grandmother    Atrial fibrillation Paternal Grandmother    AAA (abdominal aortic aneurysm) Paternal Grandmother    Heart disease Paternal Grandmother    Heart attack Paternal Grandfather    Varicose Veins Paternal Grandfather    Breast cancer Neg Hx     Social History   Tobacco Use   Smoking status: Former    Current packs/day: 0.00    Average packs/day: 0.5 packs/day for 7.0  years (3.5 ttl pk-yrs)    Types: Cigarettes    Start date: 01/16/2009    Quit date: 01/17/2016    Years since quitting: 7.2   Smokeless tobacco: Never  Substance Use Topics   Alcohol use: Yes    Alcohol/week: 2.0 standard drinks of alcohol    Types: 2 Glasses of wine per week     Current Outpatient Medications:    acyclovir (ZOVIRAX) 800 MG tablet, Take 1 tablet (800 mg total) by mouth 3 (three) times daily as needed (for 2 days take at onset of herpes outbreak)., Disp: 30 tablet, Rfl: 11   busPIRone (BUSPAR) 15 MG tablet, Take 1 tablet (15 mg total) by mouth 3 (three) times daily as needed (anxiety stress)., Disp: 90 tablet, Rfl: 11   etonogestrel-ethinyl estradiol (NUVARING) 0.12-0.015 MG/24HR vaginal ring, Insert vaginally and leave in place for 3 consecutive weeks, then remove for 1 week., Disp: 3 each, Rfl: 3   ipratropium-albuterol (DUONEB) 0.5-2.5 (3) MG/3ML SOLN, Take 3 mLs by nebulization every 4 (four) hours as needed., Disp: 360 mL, Rfl: 0   traZODone (DESYREL) 50 MG tablet, Take 0.5-1 tablets (25-50 mg total) by mouth at bedtime as needed for sleep., Disp: 30 tablet, Rfl: 1   Albuterol-Budesonide (AIRSUPRA) 90-80 MCG/ACT AERO, INHALE 2 PUFFS INTO THE LUNGS EVERY 4 (FOUR) HOURS AS NEEDED FOR SHORTNESS OF BREATH/WHEEZING (Patient not taking: Reported on 04/07/2023), Disp: 10.7 g, Rfl: 3   escitalopram (LEXAPRO) 10 MG tablet, Take 1 tablet (10 mg total) by mouth daily., Disp: 90 tablet, Rfl: 1   montelukast (SINGULAIR) 10 MG tablet, TAKE 1 TABLET BY MOUTH EVERYDAY AT BEDTIME (Patient not taking: Reported on 04/07/2023), Disp: 30 tablet, Rfl: 5  Allergies  Allergen Reactions   Benadryl Allergy [Diphenhydramine Hcl]     Rapid heart rate.   Duloxetine Hcl Anxiety    Patient reported palpitations, increased anxiety and sleeplessness with Duloxetine    I personally reviewed active problem list, medication list, allergies, notes from last encounter with the patient/caregiver  today.   Review of Systems  Psychiatric/Behavioral:  Positive for depression. Negative for suicidal ideas. The patient is nervous/anxious and has insomnia.       Objective  Vitals:   04/07/23 1030  BP: 128/74  Pulse: 91  Resp: 16  SpO2: 100%  Weight: 289 lb (131.1 kg)  Height: 5\' 10"  (1.778 m)    Body mass index is 41.47 kg/m.  Physical Exam Vitals reviewed.  Constitutional:  General: She is awake.     Appearance: Normal appearance. She is well-developed and well-groomed.  HENT:     Head: Normocephalic and atraumatic.  Eyes:     General: Lids are normal. Gaze aligned appropriately.     Extraocular Movements: Extraocular movements intact.     Conjunctiva/sclera: Conjunctivae normal.  Pulmonary:     Effort: Pulmonary effort is normal.  Neurological:     General: No focal deficit present.     Mental Status: She is alert and oriented to person, place, and time.     GCS: GCS eye subscore is 4. GCS verbal subscore is 5. GCS motor subscore is 6.     Cranial Nerves: No cranial nerve deficit, dysarthria or facial asymmetry.  Psychiatric:        Attention and Perception: Attention and perception normal.        Mood and Affect: Mood and affect normal.        Speech: Speech normal.        Behavior: Behavior normal. Behavior is cooperative.      No results found for this or any previous visit (from the past 2160 hours).   PHQ2/9:    04/07/2023   10:28 AM 03/02/2023   10:27 AM 02/23/2023   11:20 AM 01/26/2023    9:55 AM 10/26/2022    9:16 AM  Depression screen PHQ 2/9  Decreased Interest 0 3 3 0 0  Down, Depressed, Hopeless 0 3 3 0 0  PHQ - 2 Score 0 6 6 0 0  Altered sleeping 0 3 3  0  Tired, decreased energy 0 3 3  0  Change in appetite 0 3 0  0  Feeling bad or failure about yourself  0 3 2  0  Trouble concentrating 0 0 3  0  Moving slowly or fidgety/restless 0 0 0  0  Suicidal thoughts 0 0 0  0  PHQ-9 Score 0 18 17  0  Difficult doing work/chores  Very  difficult Very difficult  Not difficult at all      Fall Risk:    03/02/2023   10:26 AM 02/23/2023   11:19 AM 10/26/2022    9:16 AM 09/23/2022   10:10 AM 03/30/2022    1:16 PM  Fall Risk   Falls in the past year? 1 1 0 0 0  Number falls in past yr: 1 1 0 0 0  Injury with Fall? 1 1 0 0 0  Risk for fall due to : No Fall Risks Impaired balance/gait No Fall Risks No Fall Risks No Fall Risks  Follow up Falls prevention discussed;Education provided;Falls evaluation completed Falls prevention discussed;Education provided;Falls evaluation completed Falls prevention discussed;Education provided;Falls evaluation completed Falls prevention discussed Falls prevention discussed;Education provided;Falls evaluation completed      Functional Status Survey:      Assessment & Plan  Problem List Items Addressed This Visit       Other   Generalized anxiety disorder   Chronic, historic condition Appears much improved since her most recent visit.  GAD-7 scores have improved from 21 to 1 today after medication change. She reports that she is much more satisfied with current medication regimen and is no longer having to use BuSpar at this time. Continue Lexapro 10 mg p.o. daily.  Refills provided today Follow-up in about 2 months as we are starting trazodone for insomnia.  After that can probably go every 6 months as needed      Relevant Medications  traZODone (DESYREL) 50 MG tablet   escitalopram (LEXAPRO) 10 MG tablet   Psychophysiological insomnia   Chronic, chronic, ongoing Patient reports that she experiences issues with sleep maintenance. She reports that she is getting about 6 hours of sleep per night but does wake up once or twice each evening.  She does report increased energy and think she is sleeping better now that her mood symptoms are under better control. Will try adding trazodone 25 to 50 mg p.o. nightly to assist with sleep maintenance. Follow-up in about 2 months or sooner if  concerns arise      Relevant Medications   traZODone (DESYREL) 50 MG tablet   Moderate episode of recurrent major depressive disorder (HCC) - Primary   Chronic, historic condition Symptoms are significantly improved from last time.  Her PHQ-9 has improved from a score of 18 in November down to 0 today. Continue Lexapro 10 mg p.o. daily Suspect insomnia will likely improve with introduction of trazodone 25 to 50 mg p.o. nightly Will follow-up in about 2 months or sooner if concerns arise      Relevant Medications   traZODone (DESYREL) 50 MG tablet   escitalopram (LEXAPRO) 10 MG tablet     Return in about 2 months (around 06/08/2023) for Depression, anxiety, insomnia (okay for virtual) .   I, Shameka Aggarwal E Donyel Nester, PA-C, have reviewed all documentation for this visit. The documentation on 04/08/23 for the exam, diagnosis, procedures, and orders are all accurate and complete.   Jacquelin Hawking, MHS, PA-C [facility]

## 2023-04-08 NOTE — Assessment & Plan Note (Signed)
Chronic, historic condition Appears much improved since her most recent visit.  GAD-7 scores have improved from 21 to 1 today after medication change. She reports that she is much more satisfied with current medication regimen and is no longer having to use BuSpar at this time. Continue Lexapro 10 mg p.o. daily.  Refills provided today Follow-up in about 2 months as we are starting trazodone for insomnia.  After that can probably go every 6 months as needed

## 2023-04-08 NOTE — Assessment & Plan Note (Signed)
Chronic, historic condition Symptoms are significantly improved from last time.  Her PHQ-9 has improved from a score of 18 in November down to 0 today. Continue Lexapro 10 mg p.o. daily Suspect insomnia will likely improve with introduction of trazodone 25 to 50 mg p.o. nightly Will follow-up in about 2 months or sooner if concerns arise

## 2023-04-08 NOTE — Assessment & Plan Note (Signed)
Chronic, chronic, ongoing Patient reports that she experiences issues with sleep maintenance. She reports that she is getting about 6 hours of sleep per night but does wake up once or twice each evening.  She does report increased energy and think she is sleeping better now that her mood symptoms are under better control. Will try adding trazodone 25 to 50 mg p.o. nightly to assist with sleep maintenance. Follow-up in about 2 months or sooner if concerns arise

## 2023-04-29 ENCOUNTER — Other Ambulatory Visit: Payer: Self-pay | Admitting: Physician Assistant

## 2023-04-29 DIAGNOSIS — F5104 Psychophysiologic insomnia: Secondary | ICD-10-CM

## 2023-05-02 NOTE — Telephone Encounter (Signed)
 Requested Prescriptions  Refused Prescriptions Disp Refills   traZODone  (DESYREL ) 50 MG tablet [Pharmacy Med Name: TRAZODONE  50 MG TABLET] 90 tablet 1    Sig: TAKE 0.5-1 TABLETS BY MOUTH AT BEDTIME AS NEEDED FOR SLEEP.     Psychiatry: Antidepressants - Serotonin Modulator Passed - 05/02/2023  9:04 AM      Passed - Completed PHQ-2 or PHQ-9 in the last 360 days      Passed - Valid encounter within last 6 months    Recent Outpatient Visits           3 weeks ago Moderate episode of recurrent major depressive disorder (HCC)   [facility] Mecum, Erin E, PA-C   2 months ago Moderate episode of recurrent major depressive disorder [facility])   [facility] Mecum, Erin E, PA-C   2 months ago Moderate episode of recurrent major depressive disorder [facility])   [facility] Mecum, Erin E, PA-C   6 months ago Allergic rhinitis due to other allergic trigger, unspecified seasonality   [facility] Leavy Mole, PA-C   7 months ago Adjustment disorder with mixed anxiety and depressed mood   [facility] Leavy Mole, PA-C       Future Appointments             In 1 month Mecum, Erin E, PA-C  [facility]

## 2023-06-07 NOTE — Progress Notes (Unsigned)
 Name: Diane Williamson   MRN: [id number]    DOB: 1990-06-18   Date:06/07/2023       Progress Note  Subjective:    Chief Complaint  No chief complaint on file.   I connected with  ORABELLE RYLEE  on 06/07/23 at 11:00 AM EST by a video enabled telemedicine application and verified that I am speaking with the correct person using two identifiers.  I discussed the limitations of evaluation and management by telemedicine and the availability of in person appointments. The patient expressed understanding and agreed to proceed. Staff also discussed with the patient that there may be a patient responsible charge related to this service. Patient Location: *** Provider Location: *** Additional Individuals present: ***  HPI  Pt had increased stress with worse depressive and anxiety sx over the past couple months With float provider cymbalta was started, but was not helpful, then lexapro was started in Nov That did seem to work better for her sx Last on lexapro 10 mg and using buspar prn and as of dec restarted trazodone for insomnia    04/07/2023   10:28 AM 03/02/2023   10:27 AM 02/23/2023   11:20 AM  Depression screen PHQ 2/9  Decreased Interest 0 3 3  Down, Depressed, Hopeless 0 3 3  PHQ - 2 Score 0 6 6  Altered sleeping 0 3 3  Tired, decreased energy 0 3 3  Change in appetite 0 3 0  Feeling bad or failure about yourself  0 3 2  Trouble concentrating 0 0 3  Moving slowly or fidgety/restless 0 0 0  Suicidal thoughts 0 0 0  PHQ-9 Score 0 18 17  Difficult doing work/chores  Very difficult Very difficult      04/07/2023   10:29 AM 03/02/2023   10:27 AM 02/23/2023   11:20 AM 10/26/2022    9:22 AM  GAD 7 : Generalized Anxiety Score  Nervous, Anxious, on Edge 1 3 2 1   Control/stop worrying 0 3 2 1   Worry too much - different things 0 3 2 1   Trouble relaxing 0 3 2 0  Restless 0 3 2 0  Easily annoyed or irritable 0 3 2 0  Afraid - awful might happen 0 3 2 0  Total GAD 7 Score 1 21  14 3   Anxiety Difficulty  Extremely difficult Very difficult Somewhat difficult      Patient Active Problem List   Diagnosis Date Noted   Moderate episode of recurrent major depressive disorder (HCC) 04/06/2022   Obesity, Class III, BMI 40-49.9 (morbid obesity) (HCC) 03/30/2022   Generalized anxiety disorder 12/30/2019   Depression with anxiety 12/30/2019   Chronic bilateral low back pain without sciatica 12/30/2019   Psychophysiological insomnia 12/30/2019   Carpal tunnel syndrome of right wrist 11/16/2019   Dysplasia of cervix, low grade (CIN 1) 07/04/2019   Chronic venous insufficiency 09/03/2018   Eczema 07/03/2018   Allergic rhinitis 07/03/2018   GERD (gastroesophageal reflux disease) 07/03/2018   Vitamin D deficiency 07/03/2018   Obesity (BMI 35.0-39.9 without comorbidity) 06/07/2018   Anemia 06/07/2018   Hematuria, microscopic 06/07/2018   ADHD (attention deficit hyperactivity disorder), 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/2018   Varicose veins of both lower extremities with pain 06/07/2018   Adjustment disorder with mixed anxiety and depressed mood 10/10/2017   Personal history of nonsuicidal self-harm 08/29/2017   Vitiligo 12/08/2010    Social History   Tobacco Use   Smoking status: Former    Current packs/day: 0.00  Average packs/day: 0.5 packs/day for 7.0 years (3.5 ttl pk-yrs)    Types: Cigarettes    Start date: 01/16/2009    Quit date: 01/17/2016    Years since quitting: 7.3   Smokeless tobacco: Never  Substance Use Topics   Alcohol use: Yes    Alcohol/week: 2.0 standard drinks of alcohol    Types: 2 Glasses of wine per week     Current Outpatient Medications:    acyclovir (ZOVIRAX) 800 MG tablet, Take 1 tablet (800 mg total) by mouth 3 (three) times daily as needed (for 2 days take at onset of herpes outbreak)., Disp: 30 tablet, Rfl: 11   Albuterol-Budesonide (AIRSUPRA) 90-80 MCG/ACT AERO, INHALE 2 PUFFS INTO THE LUNGS EVERY 4 (FOUR) HOURS AS NEEDED FOR SHORTNESS  OF BREATH/WHEEZING (Patient not taking: Reported on 04/07/2023), Disp: 10.7 g, Rfl: 3   busPIRone (BUSPAR) 15 MG tablet, Take 1 tablet (15 mg total) by mouth 3 (three) times daily as needed (anxiety stress)., Disp: 90 tablet, Rfl: 11   escitalopram (LEXAPRO) 10 MG tablet, Take 1 tablet (10 mg total) by mouth daily., Disp: 90 tablet, Rfl: 1   etonogestrel-ethinyl estradiol (NUVARING) 0.12-0.015 MG/24HR vaginal ring, Insert vaginally and leave in place for 3 consecutive weeks, then remove for 1 week., Disp: 3 each, Rfl: 3   ipratropium-albuterol (DUONEB) 0.5-2.5 (3) MG/3ML SOLN, Take 3 mLs by nebulization every 4 (four) hours as needed., Disp: 360 mL, Rfl: 0   montelukast (SINGULAIR) 10 MG tablet, TAKE 1 TABLET BY MOUTH EVERYDAY AT BEDTIME (Patient not taking: Reported on 04/07/2023), Disp: 30 tablet, Rfl: 5   traZODone (DESYREL) 50 MG tablet, Take 0.5-1 tablets (25-50 mg total) by mouth at bedtime as needed for sleep., Disp: 30 tablet, Rfl: 1  Allergies  Allergen Reactions   Benadryl Allergy [Diphenhydramine Hcl]     Rapid heart rate.   Duloxetine Hcl Anxiety    Patient reported palpitations, increased anxiety and sleeplessness with Duloxetine    I personally reviewed active problem list, medication list, allergies, family history, social history, health maintenance, notes from last encounter, lab results, imaging with the patient/caregiver today.   Review of Systems   Objective:   Virtual encounter, vitals limited, only able to obtain the following There were no vitals filed for this visit. There is no height or weight on file to calculate BMI. Nursing Note and Vital Signs reviewed.  Physical Exam  PE limited by virtual encounter  No results found for this or any previous visit (from the past 72 hours).  Assessment and Plan:   No diagnosis found.   -Red flags and when to present for emergency care or RTC including fever >101.62F, chest pain, shortness of breath,  new/worsening/un-resolving symptoms, reviewed with patient at time of visit. Follow up and care instructions discussed and provided in AVS. - I discussed the assessment and treatment plan with the patient. The patient was provided an opportunity to ask questions and all were answered. The patient agreed with the plan and demonstrated an understanding of the instructions.  I provided *** minutes of non-face-to-face time during this encounter.  Danelle Berry, PA-C 06/07/23 2:54 PM

## 2023-06-08 ENCOUNTER — Encounter: Payer: Self-pay | Admitting: Family Medicine

## 2023-06-08 ENCOUNTER — Telehealth: Payer: Self-pay | Admitting: Family Medicine

## 2023-06-08 DIAGNOSIS — F331 Major depressive disorder, recurrent, moderate: Secondary | ICD-10-CM

## 2023-06-08 DIAGNOSIS — G4700 Insomnia, unspecified: Secondary | ICD-10-CM

## 2023-06-08 DIAGNOSIS — Z3044 Encounter for surveillance of vaginal ring hormonal contraceptive device: Secondary | ICD-10-CM | POA: Diagnosis not present

## 2023-06-08 DIAGNOSIS — F418 Other specified anxiety disorders: Secondary | ICD-10-CM

## 2023-06-08 MED ORDER — ETONOGESTREL-ETHINYL ESTRADIOL 0.12-0.015 MG/24HR VA RING
VAGINAL_RING | VAGINAL | 1 refills | Status: DC
Start: 2023-06-08 — End: 2023-11-13

## 2023-06-08 MED ORDER — TRAZODONE HCL 50 MG PO TABS
25.0000 mg | ORAL_TABLET | Freq: Every evening | ORAL | 1 refills | Status: DC | PRN
Start: 2023-06-08 — End: 2024-01-02

## 2023-06-17 ENCOUNTER — Other Ambulatory Visit: Payer: Self-pay | Admitting: Physician Assistant

## 2023-06-17 DIAGNOSIS — J4550 Severe persistent asthma, uncomplicated: Secondary | ICD-10-CM

## 2023-06-19 NOTE — Telephone Encounter (Signed)
 Requested medication (s) are due for refill today: Yes  Requested medication (s) are on the active medication list: Yes  Last refill:  02/23/23  Future visit scheduled: No  Notes to clinic:  Unable to refill due to no refill protocol for this medication.      Requested Prescriptions  Pending Prescriptions Disp Refills   AIRSUPRA 90-80 MCG/ACT AERO [Pharmacy Med Name: AIRSUPRA 90-80 MCG INHALER]  3    Sig: INHALE 2 PUFFS INTO THE LUNGS EVERY 4 (FOUR) HOURS AS NEEDED FOR SHORTNESS OF BREATH/WHEEZING     Off-Protocol Failed - 06/19/2023  1:40 PM      Failed - Medication not assigned to a protocol, review manually.      Passed - Valid encounter within last 12 months    Recent Outpatient Visits           2 months ago Moderate episode of recurrent major depressive disorder (HCC)   [facility] Mecum, Erin E, PA-C   3 months ago Moderate episode of recurrent major depressive disorder [facility])   [facility] Mecum, Erin E, PA-C   3 months ago Moderate episode of recurrent major depressive disorder [facility])   [facility] Mecum, Erin E, PA-C   7 months ago Allergic rhinitis due to other allergic trigger, unspecified seasonality   [facility] [place], Sheliah Mends, PA-C   8 months ago Adjustment disorder with mixed anxiety and depressed mood   [facility] Danelle Berry, [place]

## 2023-09-06 ENCOUNTER — Ambulatory Visit: Payer: Self-pay

## 2023-09-06 ENCOUNTER — Ambulatory Visit: Payer: Self-pay | Admitting: *Deleted

## 2023-09-06 ENCOUNTER — Ambulatory Visit: Admitting: Family Medicine

## 2023-09-06 NOTE — Telephone Encounter (Signed)
 Reference NT encounter from this morning 8:24 09/06/23.    Chief Complaint: side effects from meds Symptoms: Palpitation, shaky, sob, headache Frequency: chronic-intermittent, none today Pertinent Negatives: Patient denies all symptoms today Disposition: [] ED /[] Urgent Care (no appt availability in office) / [x] Appointment(In office/virtual)/ []  [facility] [facility]/ [] Home Care/ [] Refused Recommended Disposition /[] [place] [facility]/ []  Follow-up with PCP Additional Notes:  Patient missed acute visit today, calling to reschedule.  Patient reports ongoing chronic intermittent side effects from antidepressants, requesting evaluation because yesterday her symptoms were more severe than normal. While at work on 09/05/23 she developed headache, heart palpitation with shortness of breath, mild tremors in hands, this all self resolved and not experiencing symptoms today. Video visit scheduled with PCP on 09/08/23. Educated on care advice as documented in protocol, patient verbalized understanding. Discussed reasons to call back or to seek emergency/urgent evaluation.     Copied from CRM [id number]. Topic: Appointments - Appointment Cancel/Reschedule >> Sep 06, 2023 12:29 PM Kevelyn M wrote: Patient needs to reschedule appointment from today at 09/06/2023. This was an acute visit and spoke to Nurse triage this morning. Reason for Disposition  Palpitations are a chronic symptom (recurrent or ongoing AND present > 4 weeks)  Protocols used: Heart Rate and Heartbeat Questions-A-AH

## 2023-09-06 NOTE — Telephone Encounter (Signed)
  Chief Complaint: Patient states she has been having possible reaction/SE to medication: last night at work she had headache, shaking hand/legs, racing heart and she thinks it is related to her antidepressant. Patient states she has taken it for almost 1 year- and has always felt SE- but not this bad.  Symptoms: heart racing over 100bpm, shaky-hands/legs, headache Frequency: once - at work last night- patient states er symptoms have improved Pertinent Negatives: Patient denies caffeine, alcohol  Disposition: [] ED /[] Urgent Care (no appt availability in office) / [x] Appointment(In office/virtual)/ []  [facility]/ [] Home Care/ [] Refused Recommended Disposition /[] [place] [facility]/ []  Follow-up with PCP Additional Notes: Patient is holding medication until she is seen   Copied from CRM [id number]. Topic: Clinical - Red Word Triage >> Sep 06, 2023  8:19 AM Bambi Bonine D wrote: Red Word that prompted transfer to Nurse Triage: Painful headaches  Pt stated that she thinks she is having a reaction to the medication she's taking. Pt stated that she takes escitalopram  and is now having painful headaches, hands and feet are twitching, pupils dilated and pt also stated that it feels like her heart is doing flips. Reason for Disposition . [1] Caller has URGENT medicine question about med that PCP or specialist prescribed AND [2] triager unable to answer question  Answer Assessment - Initial Assessment Questions 1. NAME of MEDICINE: "What medicine(s) are you calling about?"     escitalopram   2. QUESTION: "What is your question?" (e.g., double dose of medicine, side effect)     Racing heart- flutters in chest, hand/legs twitching, headache,  3. PRESCRIBER: "Who prescribed the medicine?" Reason: if prescribed by specialist, call should be referred to that group.     PCP 4. SYMPTOMS: "Do you have any symptoms?" If Yes, ask: "What symptoms are you having?"  "How bad are the symptoms (e.g., mild,  moderate, severe)     See above- patient states she has had these symptoms for some time- got worse last night.  Patient at work- she started having symptoms- anxious, heart racing  Protocols used: Medication Question Call-A-AH

## 2023-09-06 NOTE — Telephone Encounter (Signed)
 FYI

## 2023-09-08 ENCOUNTER — Telehealth: Admitting: Family Medicine

## 2023-09-08 DIAGNOSIS — T50905A Adverse effect of unspecified drugs, medicaments and biological substances, initial encounter: Secondary | ICD-10-CM

## 2023-09-08 DIAGNOSIS — R42 Dizziness and giddiness: Secondary | ICD-10-CM

## 2023-09-08 DIAGNOSIS — R002 Palpitations: Secondary | ICD-10-CM

## 2023-09-08 NOTE — Progress Notes (Signed)
 Name: Diane Williamson   MRN: [id number]    DOB: [birth year]   Date:09/08/2023       Progress Note  Subjective:    Chief Complaint  Chief Complaint  Patient presents with   Medication Reaction    Headache, dizzy, tachycardia since taking medicines. Just stopped Buspar  and Lexapro . Having heart "flutters", increased irritability.     I connected with  AMELLIA PANIK  on 09/08/23 at  1:00 PM EDT by a video enabled telemedicine application and verified that I am speaking with the correct person using two identifiers.  I discussed the limitations of evaluation and management by telemedicine and the availability of in person appointments. The patient expressed understanding and agreed to proceed. Staff also discussed with the patient that there may be a patient responsible charge related to this service. Patient Location: car - [place]  Provider Location: [facility] clinic  Additional Individuals present: none  HPI Pt presents virtually complaining of palpitations dizziness all worsening so she stopped buspar  and lexapro  Buspar  she had been taking for years and was doing fine with, never had SE Lexapro  was started by float PA in ths primary care office last Nov after not doing well with cymbalta , I did f/up with her about 3 months ago virtually she said moods and meds were good and things were much better She says maybe 3 months ago she started to have intermittent palpitations Then it got worse - rapid heart rate on apple watch 100-130 Feeling like it was flipflopping in her chest (her heart)  Then dizziness She is sitting in a car somewhere in [place] out of town, cannot come into the office today for in person eval  Patient Active Problem List   Diagnosis Date Noted   Moderate episode of recurrent major depressive disorder (HCC) 04/06/2022   Obesity, Class III, BMI 40-49.9 (morbid obesity) 03/30/2022   Generalized anxiety disorder 12/30/2019   Depression with anxiety 12/30/2019    Chronic bilateral low back pain without sciatica 12/30/2019   Psychophysiological insomnia 12/30/2019   Carpal tunnel syndrome of right wrist 11/16/2019   Dysplasia of cervix, low grade (CIN 1) 07/04/2019   Chronic venous insufficiency 09/03/2018   Eczema 07/03/2018   Allergic rhinitis 07/03/2018   GERD (gastroesophageal reflux disease) 07/03/2018   Vitamin D  deficiency 07/03/2018   Obesity (BMI 35.0-39.9 without comorbidity) 06/07/2018   Anemia 06/07/2018   Hematuria, microscopic 06/07/2018   ADHD (attention deficit hyperactivity disorder), 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/2018   Varicose veins of both lower extremities with pain 06/07/2018   Adjustment disorder with mixed anxiety and depressed mood 10/10/2017   Personal history of nonsuicidal self-harm 08/29/2017   Vitiligo 12/08/2010    Social History   Tobacco Use   Smoking status: Former    Current packs/day: 0.00    Average packs/day: 0.5 packs/day for 7.0 years (3.5 ttl pk-yrs)    Types: Cigarettes    Start date: 01/16/2009    Quit date: 01/17/2016    Years since quitting: 7.6   Smokeless tobacco: Never  Substance Use Topics   Alcohol use: Yes    Alcohol/week: 2.0 standard drinks of alcohol    Types: 2 Glasses of wine per week     Current Outpatient Medications:    acyclovir  (ZOVIRAX ) 800 MG tablet, Take 1 tablet (800 mg total) by mouth 3 (three) times daily as needed (for 2 days take at onset of herpes outbreak)., Disp: 30 tablet, Rfl: 11   Albuterol -Budesonide  (AIRSUPRA ) 90-80 MCG/ACT AERO, INHALE 2  PUFFS INTO THE LUNGS EVERY 4 (FOUR) HOURS AS NEEDED FOR SHORTNESS OF BREATH/WHEEZING, Disp: 10.7 g, Rfl: 1   etonogestrel -ethinyl estradiol  (NUVARING ) 0.12-0.015 MG/24HR vaginal ring, Insert vaginally and leave in place for 3 consecutive weeks, then remove for 1 week., Disp: 3 each, Rfl: 1   ipratropium-albuterol  (DUONEB) 0.5-2.5 (3) MG/3ML SOLN, Take 3 mLs by nebulization every 4 (four) hours as needed., Disp: 360 mL, Rfl: 0    traZODone  (DESYREL ) 50 MG tablet, Take 0.5-1 tablets (25-50 mg total) by mouth at bedtime as needed for sleep., Disp: 90 tablet, Rfl: 1   busPIRone  (BUSPAR ) 15 MG tablet, Take 1 tablet (15 mg total) by mouth 3 (three) times daily as needed (anxiety stress). (Patient not taking: Reported on 09/08/2023), Disp: 90 tablet, Rfl: 11   escitalopram  (LEXAPRO ) 10 MG tablet, Take 1 tablet (10 mg total) by mouth daily. (Patient not taking: Reported on 09/08/2023), Disp: 90 tablet, Rfl: 1   montelukast  (SINGULAIR ) 10 MG tablet, TAKE 1 TABLET BY MOUTH EVERYDAY AT BEDTIME (Patient not taking: Reported on 09/08/2023), Disp: 30 tablet, Rfl: 5  Allergies  Allergen Reactions   Benadryl Allergy [Diphenhydramine Hcl]     Rapid heart rate.   Duloxetine  Hcl Anxiety    Patient reported palpitations, increased anxiety and sleeplessness with Duloxetine     I personally reviewed active problem list, medication list, allergies, family history, social history, health maintenance, notes from last encounter, lab results, imaging with the patient/caregiver today.   Review of Systems  Constitutional: Negative.   HENT: Negative.    Eyes: Negative.   Respiratory: Negative.    Cardiovascular: Negative.   Gastrointestinal: Negative.   Endocrine: Negative.   Genitourinary: Negative.   Musculoskeletal: Negative.   Skin: Negative.   Allergic/Immunologic: Negative.   Neurological: Negative.   Hematological: Negative.   Psychiatric/Behavioral: Negative.    All other systems reviewed and are negative.     Objective:   Virtual encounter, vitals limited, only able to obtain the following There were no vitals filed for this visit. There is no height or weight on file to calculate BMI. Nursing Note and Vital Signs reviewed.  Physical Exam Vitals and nursing note reviewed.  Constitutional:      General: She is not in acute distress.    Appearance: She is obese. She is not ill-appearing, toxic-appearing or diaphoretic.   Pulmonary:     Effort: No respiratory distress.  Neurological:     Mental Status: She is alert.     Cranial Nerves: No dysarthria or facial asymmetry.     PE limited by virtual encounter  No results found for this or any previous visit (from the past 72 hours).  Assessment and Plan:   1. Palpitations (Primary) Offered for pt to come in today for in person OV - cannot assess this through virtual encounter - pt out of town, advised to go to UC  2. Vertigo Same as #1  3. Medication side effect, initial encounter She suddenly stopped lexapro  and buspar  which she has been taking for 6 months and several years (respectively)  This may be also causing some med withdrawal sx Encouraged her to get checked at UC to ensure nothing needs ED evaluation Would recommend restarting lexapr 1/2 dose daily for 1-2 weeks then weaning off with 5 mg dose every other day to avoid withdrawal sx And with virtual visit and all acute CC she needs in office visit, VS, exam etc to be able to help her - so UC locally was recommended  today and f/up in clinic when back in town   -Red flags and when to present for emergency care or RTC including fever >101.26F, chest pain, shortness of breath, new/worsening/un-resolving symptoms, reviewed with patient at time of visit. Follow up and care instructions discussed and provided in AVS. - I discussed the assessment and treatment plan with the patient. The patient was provided an opportunity to ask questions and all were answered. The patient agreed with the plan and demonstrated an understanding of the instructions.  I provided 15 minutes of non-face-to-face time during this encounter.  Adeline Hone, PA-C 09/08/23 1:11 PM

## 2023-09-17 ENCOUNTER — Other Ambulatory Visit: Payer: Self-pay | Admitting: Family Medicine

## 2023-09-17 DIAGNOSIS — J4550 Severe persistent asthma, uncomplicated: Secondary | ICD-10-CM

## 2023-09-18 NOTE — Telephone Encounter (Signed)
 Requested medications are due for refill today.  unsure  Requested medications are on the active medications list.  yes  Last refill. 06/19/2023 10.7 1 rf  Future visit scheduled.   no  Notes to clinic.  Medication not assigned to protocol. Please review for refill.    Requested Prescriptions  Pending Prescriptions Disp Refills   AIRSUPRA  90-80 MCG/ACT AERO [Pharmacy Med Name: AIRSUPRA  90-80 MCG INHALER]  1    Sig: INHALE 2 PUFFS INTO THE LUNGS EVERY 4 (FOUR) HOURS AS NEEDED FOR SHORTNESS OF BREATH/WHEEZING     Off-Protocol Failed - 09/18/2023  5:30 PM      Failed - Medication not assigned to a protocol, review manually.      Passed - Valid encounter within last 12 months    Recent Outpatient Visits           1 week ago Palpitations   [facility] Adeline Hone, PA-C   3 months ago Depression with anxiety   [facility] Adeline Hone, PA-C

## 2023-09-19 ENCOUNTER — Telehealth: Payer: Self-pay

## 2023-09-19 ENCOUNTER — Other Ambulatory Visit: Payer: Self-pay

## 2023-09-19 DIAGNOSIS — J4550 Severe persistent asthma, uncomplicated: Secondary | ICD-10-CM

## 2023-09-19 MED ORDER — AIRSUPRA 90-80 MCG/ACT IN AERO
2.0000 | INHALATION_SPRAY | RESPIRATORY_TRACT | 1 refills | Status: DC | PRN
Start: 2023-09-19 — End: 2024-01-01

## 2023-09-19 NOTE — Telephone Encounter (Signed)
 Refill sent in

## 2023-09-19 NOTE — Telephone Encounter (Signed)
 Copied from CRM [id number]. Topic: Clinical - Prescription Issue >> Sep 19, 2023  9:45 AM Diane Williamson wrote: Patient called to see why her prescription was denied for- Albuterol -Budesonide  (AIRSUPRA ) 90-80 MCG/ACT AERO It says -Medication not assigned to a protocol, review manually. Please call back and provide her with more info

## 2023-11-09 NOTE — Congregational Nurse Program (Signed)
  Dept: [phone]   Congregational Nurse Program Note  Date of Encounter: 11/09/2023  Past Medical History: Past Medical History:  Diagnosis Date   ADD (attention deficit disorder)    Asthma    Cystitis    Genital herpes     Encounter Details:   Today's Vitals   11/09/23 1000  BP: (!) 142/84   There is no height or weight on file to calculate BMI.  Patient is working on weight loss, has lost 7 lbs in 1.5 months.  Patient was encouraged to keep exercising.  Patient was given hand outs for decreasing sodium.

## 2023-11-12 ENCOUNTER — Other Ambulatory Visit: Payer: Self-pay | Admitting: Family Medicine

## 2023-11-12 DIAGNOSIS — Z3044 Encounter for surveillance of vaginal ring hormonal contraceptive device: Secondary | ICD-10-CM

## 2023-11-13 NOTE — Telephone Encounter (Signed)
 Requested Prescriptions  Pending Prescriptions Disp Refills   etonogestrel -ethinyl estradiol  (NUVARING ) 0.12-0.015 MG/24HR vaginal ring [Pharmacy Med Name: ETONOGESTREL -EE VAGINAL RING] 3 each 1    Sig: INSERT 1 RING VAGINALLY AS DIRECTED. REMOVE AFTER 3 WEEKS & WAIT 7 DAYS BEFORE INSERTING A NEW RING     OB/GYN:  Contraceptives Failed - 11/13/2023  5:12 PM      Failed - Last BP in normal range    BP Readings from Last 1 Encounters:  11/09/23 (!) 142/84         Passed - Valid encounter within last 12 months    Recent Outpatient Visits           2 months ago Palpitations   [facility] Leavy Mole, PA-C   5 months ago Depression with anxiety   [facility] Leavy Mole, PA-C              Passed - Patient is not a smoker

## 2023-11-14 ENCOUNTER — Ambulatory Visit (INDEPENDENT_AMBULATORY_CARE_PROVIDER_SITE_OTHER): Admitting: Nurse Practitioner

## 2023-11-14 ENCOUNTER — Encounter: Payer: Self-pay | Admitting: Nurse Practitioner

## 2023-11-14 ENCOUNTER — Other Ambulatory Visit: Payer: Self-pay | Admitting: Nurse Practitioner

## 2023-11-14 ENCOUNTER — Ambulatory Visit: Attending: Nurse Practitioner

## 2023-11-14 VITALS — BP 126/80 | HR 75 | Temp 98.2°F | Resp 16 | Ht 70.0 in | Wt 291.6 lb

## 2023-11-14 DIAGNOSIS — G4700 Insomnia, unspecified: Secondary | ICD-10-CM | POA: Diagnosis not present

## 2023-11-14 DIAGNOSIS — K219 Gastro-esophageal reflux disease without esophagitis: Secondary | ICD-10-CM

## 2023-11-14 DIAGNOSIS — Z8249 Family history of ischemic heart disease and other diseases of the circulatory system: Secondary | ICD-10-CM | POA: Diagnosis not present

## 2023-11-14 DIAGNOSIS — F900 Attention-deficit hyperactivity disorder, predominantly inattentive type: Secondary | ICD-10-CM | POA: Diagnosis not present

## 2023-11-14 DIAGNOSIS — R002 Palpitations: Secondary | ICD-10-CM

## 2023-11-14 DIAGNOSIS — F411 Generalized anxiety disorder: Secondary | ICD-10-CM | POA: Diagnosis not present

## 2023-11-14 DIAGNOSIS — J4550 Severe persistent asthma, uncomplicated: Secondary | ICD-10-CM | POA: Diagnosis not present

## 2023-11-14 DIAGNOSIS — E66813 Obesity, class 3: Secondary | ICD-10-CM

## 2023-11-14 DIAGNOSIS — F331 Major depressive disorder, recurrent, moderate: Secondary | ICD-10-CM

## 2023-11-14 MED ORDER — ZEPBOUND 2.5 MG/0.5ML ~~LOC~~ SOAJ
2.5000 mg | SUBCUTANEOUS | 0 refills | Status: AC
Start: 2023-11-14 — End: ?

## 2023-11-14 NOTE — Patient Instructions (Signed)
 Healthy Weight Loss Guide  ?? Weight Loss Goal - Aim for 1-2 pounds per week - Target: 5-10% of your starting body weight over 3-6 months ??? Nutrition Tips -aim for 1600-1800 calories a day - Eat 3 meals per day and avoid skipping meals - Fill half your plate with vegetables, a quarter with protein, a quarter with whole grains - Choose lean proteins: chicken, fish, eggs, tofu, beans - Limit: - Sugary drinks (soda, sweet tea, juice) - Fried foods and fast food - Processed snacks (chips, candy, cookies) - Drink at least 64 oz of water per day - Practice portion control and mindful eating ???? Lifestyle Habits - Track what you eat (apps like MyFitnessPal, Lose It!, or a paper log) - Get 7-9 hours of sleep per night - Manage stress (meditation, breathing exercises, counseling if needed) - Limit alcohol (empty calories and may increase hunger) ???? Exercise Recommendations - Goal: 150 minutes per week of moderate activity (e.g., brisk walking, cycling) - Start with 10-15 minutes/day and build up gradually - Add 2 days per week of strength training (light weights, resistance bands, or bodyweight) ?? Remember: Progress > Perfection Small changes every day add up. Don't give up! - Avoid high-fat or greasy foods to reduce nausea - Focus on protein at each meal to preserve muscle mass - Stay well hydrated (at least 64 oz water per day) - Limit sugar and processed carbohydrates ?? Managing Side Effects if on weight loss medication - Eat slowly and stop eating when you feel full - Use anti-nausea strategies: ginger tea, peppermint, crackers - Talk to your provider about adjusting the dose if needed - Stool softeners or fiber supplements can help with constipation ?? Staying on Track - Track weight and non-scale victories (energy, clothing fit, labs) - Follow up with your provider regularly - Don't stop medication without medical guidance - Combine medication with healthy habits for best  results ?? Remember Weight loss medications are a tool, not a shortcut. Healthy habits matter. Be patient and consistent--small changes lead to big results.

## 2023-11-14 NOTE — Progress Notes (Signed)
 BP 126/80   Pulse 75   Temp 98.2 F (36.8 C)   Resp 16   Ht 5' 10 (1.778 m)   Wt 291 lb 9.6 oz (132.3 kg)   LMP 10/07/2023   SpO2 100%   BMI 41.84 kg/m    Subjective:    Patient ID: Diane Williamson, female    DOB: 11-30-1990, 33 y.o.   MRN: [id number]  HPI: Diane Williamson is a 33 y.o. female  Chief Complaint  Patient presents with   Obesity    Wants weight loss meds   Depression    Lexapro  had a lot of side effects wants to try something else    Discussed the use of AI scribe software for clinical note transcription with the patient, who gave verbal consent to proceed.  History of Present Illness Diane Williamson is a 33 year old female who presents to transfer care.  Obesity and weight management - Obesity with prior trial of Saxenda , discontinued due to insurance issues - Currently increasing intake of protein-rich foods (meat, hard-boiled eggs, cheese) and reducing milk and soda consumption - Frustration with weight loss progress; discrepancy between home and clinic scales (7-pound loss at home) - Engaging in exercise: 20 minutes of treadmill cardio and light weight lifting - Recent break from exercise due to a pulled hip muscle  Depressive and anxiety symptoms - Depression and anxiety with prior use of Lexapro , Buspar , Cymbalta , and Zoloft  - Hospitalization in May or June due to Lexapro  side effects (heart flutters) - Lexapro  provided significant mental health benefit, 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  Asthma and allergic symptoms - Asthma managed with Air Supra and Singulair  10 mg daily - History of eczema and allergies, which may impact use of certain medical devices such as the Zio patch  Insomnia - Insomnia managed with trazodone  25 to 50 mg at bedtime  Cardiac palpitations - Heart palpitations described as 'heart flutters' when scared -has had previous work up - No prior echocardiogram - Family history of mitral valve prolapse     Starting weight: 291 lbs Starting BMI: 41.84 Waist Measurement : 48 inches  Diet: has been eating more protein and not eating out Exercise: 20 minutes cardio,  strength training  - Encourage continuation of lifestyle modifications, including dietary management and regular exercise. -continue to increase physical activity, getting at least 150 min of physical activity a week.  Work on including [employer], broadcasting/film/video 2 days a week.  - continue eating at a calorie deficit 1600-1700 cal a day, eating a well balanced diet with whole foods, avoiding processed foods.   Patient is motivated to continue working on lifestyle modification.       11/14/2023    8:18 AM 09/08/2023   11:59 AM 06/08/2023   10:55 AM  Depression screen PHQ 2/9  Decreased Interest 0 0 0  Down, Depressed, Hopeless 0 0 0  PHQ - 2 Score 0 0 0  Altered sleeping 1 0 0  Tired, decreased energy 1 2 0  Change in appetite 1 0 0  Feeling bad or failure about yourself  1 0 0  Trouble concentrating 0 0 0  Moving slowly or fidgety/restless 0 0 0  Suicidal thoughts 0 0 0  PHQ-9 Score 4 2 0  Difficult doing work/chores Somewhat difficult      Relevant past medical, surgical, family and social history reviewed and updated as indicated. Interim medical history since our last visit reviewed. Allergies and medications reviewed  and updated.  Review of Systems  Ten systems reviewed and is negative except as mentioned in HPI      Objective:     BP 126/80   Pulse 75   Temp 98.2 F (36.8 C)   Resp 16   Ht 5' 10 (1.778 m)   Wt 291 lb 9.6 oz (132.3 kg)   LMP 10/07/2023   SpO2 100%   BMI 41.84 kg/m    Wt Readings from Last 3 Encounters:  11/14/23 291 lb 9.6 oz (132.3 kg)  04/07/23 289 lb (131.1 kg)  02/23/23 281 lb 11.2 oz (127.8 kg)    Physical Exam Physical Exam MEASUREMENTS: Weight- 291, BMI- 41.84. GENERAL: Alert, cooperative, well developed, no acute distress HEENT: Normocephalic, normal oropharynx, moist mucous  membranes CHEST: Clear to auscultation bilaterally, No wheezes, rhonchi, or crackles CARDIOVASCULAR: Normal heart rate and rhythm, S1 and S2 normal without murmurs ABDOMEN: Soft, non-tender, non-distended, without organomegaly, Normal bowel sounds EXTREMITIES: No cyanosis or edema NEUROLOGICAL: Cranial nerves grossly intact, Moves all extremities without gross motor or sensory deficit   Results for orders placed or performed in visit on 09/23/22  Urine cytology ancillary only   Collection Time: 09/23/22 10:43 AM  Result Value Ref Range   Neisseria Gonorrhea Negative    Chlamydia Negative    Trichomonas Negative    Comment Normal Reference Ranger Chlamydia - Negative    Comment      Normal Reference Range Neisseria Gonorrhea - Negative   Comment Normal Reference Range Trichomonas - Negative   CBC with Differential/Platelet   Collection Time: 09/23/22 10:52 AM  Result Value Ref Range   WBC 6.6 3.8 - 10.8 Thousand/uL   RBC 4.50 3.80 - 5.10 Million/uL   Hemoglobin 12.1 11.7 - 15.5 g/dL   HCT 62.3 64.9 - 54.9 %   MCV 83.6 80.0 - 100.0 fL   MCH 26.9 (L) 27.0 - 33.0 pg   MCHC 32.2 32.0 - 36.0 g/dL   RDW 84.9 88.9 - 84.9 %   Platelets 306 140 - 400 Thousand/uL   MPV 10.2 7.5 - 12.5 fL   Neutro Abs 2,950 1,500 - 7,800 cells/uL   Lymphs Abs 3,122 850 - 3,900 cells/uL   Absolute Monocytes 416 200 - 950 cells/uL   Eosinophils Absolute 79 15 - 500 cells/uL   Basophils Absolute 33 0 - 200 cells/uL   Neutrophils Relative % 44.7 %   Total Lymphocyte 47.3 %   Monocytes Relative 6.3 %   Eosinophils Relative 1.2 %   Basophils Relative 0.5 %  COMPLETE METABOLIC PANEL WITH GFR   Collection Time: 09/23/22 10:52 AM  Result Value Ref Range   Glucose, Bld 82 65 - 99 mg/dL   BUN 8 7 - 25 mg/dL   Creat 9.21 9.49 - 9.02 mg/dL   eGFR 895 > OR = 60 fO/fpw/8.26f7   BUN/Creatinine Ratio SEE NOTE: 6 - 22 (calc)   Sodium 142 135 - 146 mmol/L   Potassium 3.9 3.5 - 5.3 mmol/L   Chloride 107 98 - 110  mmol/L   CO2 27 20 - 32 mmol/L   Calcium 9.2 8.6 - 10.2 mg/dL   Total Protein 7.0 6.1 - 8.1 g/dL   Albumin 4.5 3.6 - 5.1 g/dL   Globulin 2.5 1.9 - 3.7 g/dL (calc)   AG Ratio 1.8 1.0 - 2.5 (calc)   Total Bilirubin 0.3 0.2 - 1.2 mg/dL   Alkaline phosphatase (APISO) 55 31 - 125 U/L   AST 10 10 - 30  U/L   ALT 8 6 - 29 U/L  Iron, TIBC and Ferritin Panel   Collection Time: 09/23/22 10:52 AM  Result Value Ref Range   Iron 25 (L) 40 - 190 mcg/dL   TIBC 660 749 - 549 mcg/dL (calc)   %SAT 7 (L) 16 - 45 % (calc)   Ferritin 5 (L) 16 - 154 ng/mL          Assessment & Plan:   Problem List Items Addressed This Visit       Respiratory   Severe persistent asthma     Digestive   GERD (gastroesophageal reflux disease) - Primary     Other   ADHD (attention deficit hyperactivity disorder), inattentive type   Generalized anxiety disorder   Obesity, Class III, BMI 40-49.9 (morbid obesity)   Relevant Medications   tirzepatide  (ZEPBOUND ) 2.5 MG/0.5ML Pen   Moderate episode of recurrent major depressive disorder (HCC)   Other Visit Diagnoses       Insomnia, unspecified type            Assessment and Plan Assessment & Plan Obesity Obesity with a BMI of 41.84. Previous trial of Saxenda  was unsuccessful due to insurance issues. Current weight loss efforts include dietary changes and exercise, but weight loss remains challenging. Discussed various weight loss medications including Zepbound , Wegovy, phentermine, Qsymia, and Contrave, along with their potential side effects and benefits. Emphasized the importance of consistency in weight loss efforts and the potential need for long-term medication use. Discussed the role of bioavailability in medication effectiveness and the potential need for trial and error in finding the right medication and dosage. - Submit prior authorization for Zepbound . - If Zepbound  is not approved, attempt prior authorization for [facility]. - Consider phentermine,  Qsymia, or Contrave if injectables are not approved. - Provide weight loss guidance and educational materials. - Schedule follow-up every three months to monitor progress.  Depression and Anxiety Disorders Depression and anxiety with previous use of Lexapro , Buspar , Cymbalta , and Zoloft . Lexapro  was effective but caused adverse effects leading to hospitalization. Discussed the potential for DNA testing to guide medication selection. - Perform DNA testing to guide future medication choices. - Review DNA test results to determine appropriate medication options.  Asthma Asthma managed with Air Supra and Singulair  10 mg daily.  Insomnia Insomnia managed with trazodone  25-50 mg at bedtime.  Attention-Deficit Hyperactivity Disorder (ADHD) ADHD.  Palpitations Reports of palpitations and family history of mitral valve prolapse. Previous adverse effects from Lexapro . Discussed the need for further cardiac evaluation. - Order Zio patch to monitor heart activity for two weeks. - Refer to cardiology for echocardiogram.        Follow up plan: Return in about 3 months (around 02/14/2024) for follow up.

## 2023-11-15 ENCOUNTER — Other Ambulatory Visit: Payer: Self-pay | Admitting: Nurse Practitioner

## 2023-11-15 DIAGNOSIS — E66813 Obesity, class 3: Secondary | ICD-10-CM

## 2023-11-15 NOTE — Telephone Encounter (Signed)
 Requested medication (s) are due for refill today -no  Requested medication (s) are on the active medication list -yes  Future visit scheduled -yes  Last refill: 11/14/23  Notes to clinic:   Pharmacy comment: Alternative Requested:NOT COVERED.    Requested Prescriptions  Pending Prescriptions Disp Refills   WEGOVY 0.25 MG/0.5ML SOAJ [Pharmacy Med Name: WEGOVY 0.25 MG/0.5 ML PEN]  0     Endocrinology:  Diabetes - GLP-1 Receptor Agonists - semaglutide Failed - 11/15/2023 10:32 AM      Failed - HBA1C in normal range and within 180 days    Hgb A1c MFr Bld  Date Value Ref Range Status  02/01/2021 5.4 <5.7 % of total Hgb Final    Comment:    For the purpose of screening for the presence of diabetes: . <5.7%       Consistent with the absence of diabetes 5.7-6.4%    Consistent with increased risk for diabetes             (prediabetes) > or =6.5%  Consistent with diabetes . This assay result is consistent with a decreased risk of diabetes. . Currently, no consensus exists regarding use of hemoglobin A1c for diagnosis of diabetes in children. . According to American Diabetes Association (ADA) guidelines, hemoglobin A1c <7.0% represents optimal control in non-pregnant diabetic patients. Different metrics may apply to specific patient populations.  Standards of Medical Care in Diabetes(ADA). .          Failed - Cr in normal range and within 360 days    Creat  Date Value Ref Range Status  09/23/2022 0.78 0.50 - 0.97 mg/dL Final         Passed - Valid encounter within last 6 months    Recent Outpatient Visits           Yesterday Gastroesophageal reflux disease, unspecified whether esophagitis present   [facility] Gareth Mliss FALCON, FNP   2 months ago Palpitations   [facility] Leavy Mole, PA-C   5 months ago Depression with anxiety   [facility] Leavy Mole, PA-C       Future  Appointments             In 3 months Gareth, Mliss FALCON, FNP [facility], [facility]               Requested Prescriptions  Pending Prescriptions Disp Refills   WEGOVY 0.25 MG/0.5ML EMMANUEL Cocking Med Name: WEGOVY 0.25 MG/0.5 ML PEN]  0     Endocrinology:  Diabetes - GLP-1 Receptor Agonists - semaglutide Failed - 11/15/2023 10:32 AM      Failed - HBA1C in normal range and within 180 days    Hgb A1c MFr Bld  Date Value Ref Range Status  02/01/2021 5.4 <5.7 % of total Hgb Final    Comment:    For the purpose of screening for the presence of diabetes: . <5.7%       Consistent with the absence of diabetes 5.7-6.4%    Consistent with increased risk for diabetes             (prediabetes) > or =6.5%  Consistent with diabetes . This assay result is consistent with a decreased risk of diabetes. . Currently, no consensus exists regarding use of hemoglobin A1c for diagnosis of diabetes in children. . According to American Diabetes Association (ADA) guidelines, hemoglobin A1c <7.0% represents optimal control in non-pregnant diabetic patients. Different  metrics may apply to specific patient populations.  Standards of Medical Care in Diabetes(ADA). .          Failed - Cr in normal range and within 360 days    Creat  Date Value Ref Range Status  09/23/2022 0.78 0.50 - 0.97 mg/dL Final         Passed - Valid encounter within last 6 months    Recent Outpatient Visits           Yesterday Gastroesophageal reflux disease, unspecified whether esophagitis present   [facility] Gareth Mliss FALCON, FNP   2 months ago Palpitations   [facility] Leavy Mole, PA-C   5 months ago Depression with anxiety   [facility] Leavy Mole, PA-C       Future Appointments             In 3 months Gareth, Mliss FALCON, FNP [facility], [facility]

## 2023-11-16 NOTE — Telephone Encounter (Signed)
 Requested medications are due for refill today.  See note  Requested medications are on the active medications list.  yes  Last refill. Ordered 11/14/2023  Future visit scheduled.   yes  Notes to clinic.    Pharmacy comment: Alternative Requested:NOT COVERED.     Requested Prescriptions  Pending Prescriptions Disp Refills   WEGOVY 0.25 MG/0.5ML SOAJ [Pharmacy Med Name: WEGOVY 0.25 MG/0.5 ML PEN]  0     Endocrinology:  Diabetes - GLP-1 Receptor Agonists - semaglutide Failed - 11/16/2023 12:20 PM      Failed - HBA1C in normal range and within 180 days    Hgb A1c MFr Bld  Date Value Ref Range Status  02/01/2021 5.4 <5.7 % of total Hgb Final    Comment:    For the purpose of screening for the presence of diabetes: . <5.7%       Consistent with the absence of diabetes 5.7-6.4%    Consistent with increased risk for diabetes             (prediabetes) > or =6.5%  Consistent with diabetes . This assay result is consistent with a decreased risk of diabetes. . Currently, no consensus exists regarding use of hemoglobin A1c for diagnosis of diabetes in children. . According to American Diabetes Association (ADA) guidelines, hemoglobin A1c <7.0% represents optimal control in non-pregnant diabetic patients. Different metrics may apply to specific patient populations.  Standards of Medical Care in Diabetes(ADA). .          Failed - Cr in normal range and within 360 days    Creat  Date Value Ref Range Status  09/23/2022 0.78 0.50 - 0.97 mg/dL Final         Passed - Valid encounter within last 6 months    Recent Outpatient Visits           2 days ago Gastroesophageal reflux disease, unspecified whether esophagitis present   [facility] Gareth Mliss FALCON, FNP   2 months ago Palpitations   [facility] Leavy Mole, PA-C   5 months ago Depression with anxiety   [facility] Leavy Mole, PA-C        Future Appointments             In 3 months Gareth, Mliss FALCON, FNP [facility], [facility]

## 2023-11-17 ENCOUNTER — Telehealth: Payer: Self-pay | Admitting: Pharmacy Technician

## 2023-11-17 ENCOUNTER — Other Ambulatory Visit (HOSPITAL_COMMUNITY): Payer: Self-pay

## 2023-11-17 NOTE — Telephone Encounter (Signed)
 Good afternoon, I do not see a prescription for wegovy 0.25 mg on her med list. Please have provider send in the script and I will go ahead and submit the PA or wegovy.  [facility] stopped paying for zepbound  for weight loss as of October 17, 2023. They only consider zepbound  exclusively for OSA with obesity.

## 2023-11-17 NOTE — Telephone Encounter (Signed)
 Pharmacy Patient Advocate Encounter   Received notification from RX Request Messages that prior authorization for [facility] 0.25MG /0.5ML auto-injectors is required/requested.   Insurance verification completed.   The patient is insured through [facility] .   Per test claim: PA required; PA submitted to above mentioned insurance via CoverMyMeds Key/confirmation #/EOC [id number] Status is pending

## 2023-11-18 ENCOUNTER — Other Ambulatory Visit (HOSPITAL_COMMUNITY): Payer: Self-pay

## 2023-11-19 ENCOUNTER — Other Ambulatory Visit: Payer: Self-pay | Admitting: Family Medicine

## 2023-11-19 DIAGNOSIS — J4550 Severe persistent asthma, uncomplicated: Secondary | ICD-10-CM

## 2023-11-19 DIAGNOSIS — J3089 Other allergic rhinitis: Secondary | ICD-10-CM

## 2023-11-20 ENCOUNTER — Other Ambulatory Visit (HOSPITAL_COMMUNITY): Payer: Self-pay

## 2023-11-20 NOTE — Telephone Encounter (Signed)
 Pharmacy Patient Advocate Encounter  Received notification from [facility] that Prior Authorization for [facility] 0.25MG /0.5ML auto-injectors has been APPROVED from 11/17/24 to 06/16/24. Ran test claim, Copay is $24.99. This test claim was processed through [facility]- copay amounts may vary at other pharmacies due to pharmacy/plan contracts, or as the patient moves through the different stages of their insurance plan.   PA #/Case ID/Reference #: [id number]

## 2023-11-21 NOTE — Telephone Encounter (Signed)
 Requested Prescriptions  Pending Prescriptions Disp Refills   montelukast  (SINGULAIR ) 10 MG tablet [Pharmacy Med Name: MONTELUKAST  SOD 10 MG TABLET] 90 tablet 1    Sig: TAKE 1 TABLET BY MOUTH EVERYDAY AT BEDTIME     Pulmonology:  Leukotriene Inhibitors Passed - 11/21/2023  9:09 AM      Passed - Valid encounter within last 12 months    Recent Outpatient Visits           1 week ago Gastroesophageal reflux disease, unspecified whether esophagitis present   [facility] Gareth Mliss FALCON, FNP   2 months ago Palpitations   [facility] Leavy Mole, PA-C   5 months ago Depression with anxiety   [facility] Leavy Mole, PA-C       Future Appointments             In 3 weeks Argentina Clap, MD [facility] [place]   In 2 months Gareth, Mliss FALCON, FNP [facility], [facility]

## 2023-11-24 ENCOUNTER — Encounter: Payer: Self-pay | Admitting: Nurse Practitioner

## 2023-11-30 ENCOUNTER — Encounter: Payer: Self-pay | Admitting: Nurse Practitioner

## 2023-12-14 NOTE — Progress Notes (Unsigned)
  Cardiology Office Note   Date:  12/15/2023  ID:  Diane Williamson, DOB [birth year], MRN [id number] PCP: Gareth Mliss FALCON, FNP  [facility] Providers Cardiologist:  Caron Poser, MD  History of Present Illness Diane Williamson is a 33 y.o. female PMH obesity, asthma who presents for further evaluation and management of palpitations.  Patient reports an approximate 49-month history of frequent palpitations.  She works third shift.  She notes that the palpitations are typically short-lived and self-limited.  She has not had any syncope.  She does drink a lot of caffeine since she works third shift, but denies any other clear triggers.  Recent labs were unremarkable with normal electrolytes and thyroid  function.  She does take albuterol  twice a day, but she says she has not noticed any correlation with symptoms.  There is a family history reported of MVP.  She says that she was taking an antidepressant medicine and since she has now stopped, the episodes seem to have reduced in frequency.  Relevant CVD History -Zio monitor 11/14/2023 pending   ROS: Pt denies any chest discomfort, jaw pain, arm pain, syncope, presyncope, orthopnea, PND, or LE edema.  Studies Reviewed I have independently reviewed the patient's ECG and recent bloodowork.  Physical Exam VS:  BP 128/80 (BP Location: Right Arm, Patient Position: Sitting, Cuff Size: Normal)   Pulse 73   Ht 5' 10 (1.778 m)   Wt 296 lb (134.3 kg)   SpO2 97%   BMI 42.47 kg/m        Wt Readings from Last 3 Encounters:  12/15/23 296 lb (134.3 kg)  11/14/23 291 lb 9.6 oz (132.3 kg)  04/07/23 289 lb (131.1 kg)    GEN: No acute distress. NECK: No JVD; No carotid bruits. CARDIAC: RRR, no murmurs, rubs, gallops. RESPIRATORY:  Clear to auscultation. EXTREMITIES:  Warm and well-perfused. No edema.  ASSESSMENT AND PLAN Palpitations Caffeine use FHx of mitral valve prolapse Recent labs without clear cause.  She does use a lot of caffeine  due to working third shift.  Episodes are short and self-limited, but have increased in frequency. No syncope. She notes that she was recently taken off of an antidepressant medicine which seems to have reduced the frequency.  Several family members do have history of MVP.  Plan: - Follow-up monitor results - Echo to evaluate for structural cause of her symptoms - Discussed trigger avoidance; further treatment pending workup       Dispo: RTC as needed  Signed, Caron Poser, MD

## 2023-12-15 ENCOUNTER — Ambulatory Visit

## 2023-12-15 VITALS — BP 128/80 | HR 73 | Ht 70.0 in | Wt 296.0 lb

## 2023-12-15 DIAGNOSIS — Z8249 Family history of ischemic heart disease and other diseases of the circulatory system: Secondary | ICD-10-CM | POA: Diagnosis not present

## 2023-12-15 DIAGNOSIS — R002 Palpitations: Secondary | ICD-10-CM

## 2023-12-15 DIAGNOSIS — F1520 Other stimulant dependence, uncomplicated: Secondary | ICD-10-CM

## 2023-12-15 NOTE — Patient Instructions (Signed)
 Medication Instructions:  Your physician recommends that you continue on your current medications as directed. Please refer to the Current Medication list given to you today.  *If you need a refill on your cardiac medications before your next appointment, please call your pharmacy*  Lab Work: No labs ordered today  If you have labs (blood work) drawn today and your tests are completely normal, you will receive your results only by: MyChart Message (if you have MyChart) OR A paper copy in the mail If you have any lab test that is abnormal or we need to change your treatment, we will call you to review the results.  Testing/Procedures: Your physician has requested that you have an echocardiogram. Echocardiography is a painless test that uses sound waves to create images of your heart. It provides your doctor with information about the size and shape of your heart and how well your heart's chambers and valves are working.   You may receive an ultrasound enhancing agent through an IV if needed to better visualize your heart during the echo. This procedure takes approximately one hour.  There are no restrictions for this procedure.  This will take place at [address] [facility], [place] [address]  Please note: We ask at that you not bring children with you during ultrasound (echo/ vascular) testing. Due to room size and safety concerns, children are not allowed in the ultrasound rooms during exams. Our front office staff cannot provide observation of children in our lobby area while testing is being conducted. An adult accompanying a patient to their appointment will only be allowed in the ultrasound room at the discretion of the ultrasound technician under special circumstances. We apologize for any inconvenience.   Follow-Up:  We will contact you with results of your Echocardiogram either by telephone or MyChart.  At [facility], you and your health needs  are our priority.  As part of our continuing mission to provide you with exceptional heart care, our providers are all part of one team.  This team includes your primary Cardiologist (physician) and Advanced Practice Providers or APPs (Physician Assistants and Nurse Practitioners) who all work together to provide you with the care you need, when you need it.  Your next appointment:    Follow up as needed.  Provider:   You may see Caron Poser, MD   We recommend signing up for the patient portal called MyChart.  Sign up information is provided on this After Visit Summary.  MyChart is used to connect with patients for Virtual Visits (Telemedicine).  Patients are able to view lab/test results, encounter notes, upcoming appointments, etc.  Non-urgent messages can be sent to your provider as well.   To learn more about what you can do with MyChart, go to [email or web address].

## 2023-12-23 DIAGNOSIS — R002 Palpitations: Secondary | ICD-10-CM | POA: Diagnosis not present

## 2023-12-23 DIAGNOSIS — Z8249 Family history of ischemic heart disease and other diseases of the circulatory system: Secondary | ICD-10-CM

## 2023-12-25 ENCOUNTER — Ambulatory Visit: Payer: Self-pay | Admitting: Nurse Practitioner

## 2023-12-29 ENCOUNTER — Other Ambulatory Visit: Payer: Self-pay | Admitting: Family Medicine

## 2023-12-29 DIAGNOSIS — J4550 Severe persistent asthma, uncomplicated: Secondary | ICD-10-CM

## 2023-12-31 ENCOUNTER — Other Ambulatory Visit: Payer: Self-pay | Admitting: Family Medicine

## 2023-12-31 DIAGNOSIS — G4700 Insomnia, unspecified: Secondary | ICD-10-CM

## 2024-01-01 NOTE — Telephone Encounter (Signed)
 Requested medication (s) are due for refill today -yes  Requested medication (s) are on the active medication list -yes  Future visit scheduled -yes  Last refill: 09/19/23 10.7g 1 RF  Notes to clinic: off protocol- provider review   Requested Prescriptions  Pending Prescriptions Disp Refills   AIRSUPRA  90-80 MCG/ACT AERO [Pharmacy Med Name: AIRSUPRA  90-80 MCG INHALER]  1    Sig: TAKE 2 PUFFS BY MOUTH EVERY 4 HOURS AS NEEDED     Off-Protocol Failed - 01/01/2024 11:04 AM      Failed - Medication not assigned to a protocol, review manually.      Passed - Valid encounter within last 12 months    Recent Outpatient Visits           1 month ago Gastroesophageal reflux disease, unspecified whether esophagitis present   [facility] Gareth Mliss FALCON, FNP   3 months ago Palpitations   [facility] Leavy Mole, PA-C   6 months ago Depression with anxiety   [facility] Leavy Mole, PA-C       Future Appointments             In 1 month Gareth, Mliss FALCON, FNP [facility], [place]               Requested Prescriptions  Pending Prescriptions Disp Refills   AIRSUPRA  90-80 MCG/ACT AERO [Pharmacy Med Name: AIRSUPRA  90-80 MCG INHALER]  1    Sig: TAKE 2 PUFFS BY MOUTH EVERY 4 HOURS AS NEEDED     Off-Protocol Failed - 01/01/2024 11:04 AM      Failed - Medication not assigned to a protocol, review manually.      Passed - Valid encounter within last 12 months    Recent Outpatient Visits           1 month ago Gastroesophageal reflux disease, unspecified whether esophagitis present   [facility] Gareth Mliss FALCON, FNP   3 months ago Palpitations   [facility] Leavy Mole, PA-C   6 months ago Depression with anxiety   [facility] Leavy Mole, PA-C       Future Appointments             In 1  month Gareth, Mliss FALCON, FNP [facility], [place]

## 2024-01-02 NOTE — Telephone Encounter (Signed)
 Requested Prescriptions  Pending Prescriptions Disp Refills   traZODone  (DESYREL ) 50 MG tablet [Pharmacy Med Name: TRAZODONE  50 MG TABLET] 90 tablet 0    Sig: TAKE 0.5-1 TABLETS BY MOUTH AT BEDTIME AS NEEDED FOR SLEEP.     Psychiatry: Antidepressants - Serotonin Modulator Passed - 01/02/2024 10:50 AM      Passed - Completed PHQ-2 or PHQ-9 in the last 360 days      Passed - Valid encounter within last 6 months    Recent Outpatient Visits           1 month ago Gastroesophageal reflux disease, unspecified whether esophagitis present   [facility] Gareth Mliss FALCON, FNP   3 months ago Palpitations   [facility] Leavy Mole, PA-C   6 months ago Depression with anxiety   [facility] Leavy Mole, PA-C       Future Appointments             In 1 month Gareth, Mliss FALCON, FNP [facility], [place]

## 2024-01-27 ENCOUNTER — Other Ambulatory Visit: Payer: Self-pay | Admitting: Family Medicine

## 2024-01-30 NOTE — Telephone Encounter (Signed)
 Requested Prescriptions  Pending Prescriptions Disp Refills   acyclovir  (ZOVIRAX ) 800 MG tablet [Pharmacy Med Name: ACYCLOVIR  800 MG TABLET] 30 tablet 2    Sig: TAKE 1 TABLET (800 MG TOTAL) BY MOUTH 3 (THREE) TIMES DAILY AS NEEDED (FOR 2 DAYS TAKE AT ONSET OF HERPES OUTBREAK).     Antimicrobials:  Antiviral Agents - Anti-Herpetic Passed - 01/30/2024 12:50 PM      Passed - Valid encounter within last 12 months    Recent Outpatient Visits           2 months ago Gastroesophageal reflux disease, unspecified whether esophagitis present   [facility] Gareth Mliss FALCON, FNP   4 months ago Palpitations   [facility] Leavy Mole, PA-C   7 months ago Depression with anxiety   [facility] Leavy Mole, PA-C       Future Appointments             In 2 weeks Gareth, Mliss FALCON, FNP [facility], [place]

## 2024-02-02 ENCOUNTER — Ambulatory Visit: Payer: Self-pay

## 2024-02-02 ENCOUNTER — Ambulatory Visit

## 2024-02-02 DIAGNOSIS — R002 Palpitations: Secondary | ICD-10-CM

## 2024-02-02 LAB — ECHOCARDIOGRAM COMPLETE
Area-P 1/2: 3.45 cm2
S' Lateral: 3.1 cm

## 2024-02-14 NOTE — Progress Notes (Deleted)
   There were no vitals taken for this visit.   Subjective:    Patient ID: Diane Williamson, female    DOB: 29-Dec-1990, 33 y.o.   MRN: [id number]  HPI: Diane Williamson is a 33 y.o. female with a history of ADHD, obesity, depression, anxiety, asthma, allergic rhinitis, GERD, insomnia, and palpitations  Obesity and Weight Management: She has been working on increasing protein rich foods in her diet and reducing sugary foods/drinks, including soft drinks. Exercise includes  Weight 3 months ago was 296 lb with a BMI of 41.84  ADHD:  Depression and Anxiety: She did Genesight testing at the end of July 2025. She is currently taking Lexapro    Asthma and Allergic Rhinitis: Currently taking Air Supra and Singulair  10 mg which is helping manage her asthma symptoms. She does have a history of eczema and allergies, but not currently taking a daily allergy medication.    GERD:  Insomnia: Taking trazodone  25 to 50 mg at bedtime which hsa been helping improve sleep. She is getting around   Palpitations: History of palpitations as she describes as heart flutters. She did follow-up with Cardiology and was told to get Echocardiogram done. Heart monitor that was ordered at visit on 11/14/23 showed no arrhythmias or other concerning rhythms. Echocardiogram was also normal and showed no signs of mitral valve prolapse given family hx of condition.        11/14/2023    8:18 AM 09/08/2023   11:59 AM 06/08/2023   10:55 AM  Depression screen PHQ 2/9  Decreased Interest 0 0 0  Down, Depressed, Hopeless 0 0 0  PHQ - 2 Score 0 0 0  Altered sleeping 1 0 0  Tired, decreased energy 1 2 0  Change in appetite 1 0 0  Feeling bad or failure about yourself  1 0 0  Trouble concentrating 0 0 0  Moving slowly or fidgety/restless 0 0 0  Suicidal thoughts 0 0 0  PHQ-9 Score 4 2 0  Difficult doing work/chores Somewhat difficult      Relevant past medical, surgical, family and social history reviewed and updated as  indicated. Interim medical history since our last visit reviewed. Allergies and medications reviewed and updated.  Review of Systems  Per HPI unless specifically indicated above     Objective:     There were no vitals taken for this visit.  {Vitals History (Optional):23777} Wt Readings from Last 3 Encounters:  12/15/23 296 lb (134.3 kg)  11/14/23 291 lb 9.6 oz (132.3 kg)  04/07/23 289 lb (131.1 kg)    Physical Exam   Results for orders placed or performed in visit on 02/02/24  ECHOCARDIOGRAM COMPLETE   Collection Time: 02/05/24  7:48 AM  Result Value Ref Range   S' Lateral 3.10 cm   Area-P 1/2 3.45 cm2   Est EF 60 - 65%    {Labs (Optional):23779}       Assessment & Plan:   Problem List Items Addressed This Visit   None    Assessment and Plan         Follow up plan: No follow-ups on file.

## 2024-02-15 ENCOUNTER — Ambulatory Visit: Admitting: Nurse Practitioner

## 2024-02-28 ENCOUNTER — Ambulatory Visit: Admitting: Nurse Practitioner

## 2024-02-28 NOTE — Progress Notes (Deleted)
 There were no vitals taken for this visit.   Subjective:    Patient ID: Diane Williamson, female    DOB: [birth year], 33 y.o.   MRN: [id number]  HPI: Diane Williamson is a 33 y.o. female  No chief complaint on file.   Obesity: -Medications:  -Patient is compliant with above medications and denies any side effects.  -Diet: -Exercise: -Weight loss:  Generalized Anxiety Disorder and Depression: -Medications: Buspar  15 mg TID PRN -Patient is compliant with above medications and denies any side effects. -Depression and anxiety with prior use of Lexapro , Buspar , Cymbalta , and Zoloft . -GeneSight testing performed.   Insomnia: -Medications: Trazodone  50 mg PRN at bedtime -Patient is compliant with above medications and denies any side effects.  Asthma and Allergic Rhinitis: -Medications: Airsupra  inhaler q4h PRN, Singulair  10 mg daily -Patient is compliant with above medications and denies any side effects. -Patient denies any asthma exacerbations or shortness of breath.  Cardiac palpitations: -Heart palpitations described as 'heart flutters' when scared -Zio patch previously ordered to monitor heart activity for two weeks. Per cardiology, monitor looks benign; no arrhythmia, IST, or frequent ectopy. -Per cardiology on 02/02/2024, echocardiogram was normal, with no signs of mitral valve prolapse  -Family history of mitral valve prolapse      11/14/2023    8:18 AM 09/08/2023   11:59 AM 06/08/2023   10:55 AM  Depression screen PHQ 2/9  Decreased Interest 0 0 0  Down, Depressed, Hopeless 0 0 0  PHQ - 2 Score 0 0 0  Altered sleeping 1 0 0  Tired, decreased energy 1 2 0  Change in appetite 1 0 0  Feeling bad or failure about yourself  1 0 0  Trouble concentrating 0 0 0  Moving slowly or fidgety/restless 0 0 0  Suicidal thoughts 0 0 0  PHQ-9 Score 4  2  0   Difficult doing work/chores Somewhat difficult       Data saved with a previous flowsheet row definition        11/14/2023    8:18 AM 09/08/2023   12:00 PM 06/08/2023   10:56 AM 04/07/2023   10:29 AM  GAD 7 : Generalized Anxiety Score  Nervous, Anxious, on Edge 1 0 0 1  Control/stop worrying 0 0 0 0  Worry too much - different things 1 0 0 0  Trouble relaxing 0 0 0 0  Restless 1 0 0 0  Easily annoyed or irritable 0 3 0 0  Afraid - awful might happen 1 0 0 0  Total GAD 7 Score 4 3 0 1  Anxiety Difficulty Somewhat difficult         Relevant past medical, surgical, family and social history reviewed and updated as indicated. Interim medical history since our last visit reviewed. Allergies and medications reviewed and updated.  Review of Systems  Constitutional: Negative for fever or weight change.  Respiratory: Negative for cough and shortness of breath.   Cardiovascular: Negative for chest pain or palpitations.  Gastrointestinal: Negative for abdominal pain, no bowel changes.  Musculoskeletal: Negative for gait problem or joint swelling.  Skin: Negative for rash.  Neurological: Negative for dizziness or headache.  No other specific complaints in a complete review of systems (except as listed in HPI above).      Objective:     There were no vitals taken for this visit.  {Vitals History (Optional):23777} Wt Readings from Last 3 Encounters:  12/15/23 134.3 kg  11/14/23 132.3 kg  04/07/23 131.1 kg    Physical Exam  Results for orders placed or performed in visit on 02/02/24  ECHOCARDIOGRAM COMPLETE   Collection Time: 02/05/24  7:48 AM  Result Value Ref Range   S' Lateral 3.10 cm   Area-P 1/2 3.45 cm2   Est EF 60 - 65%    {Labs (Optional):23779}       Assessment & Plan:   Problem List Items Addressed This Visit       Other   Obesity, Class III, BMI 40-49.9 (morbid obesity) (HCC) - Primary   Other Visit Diagnoses       Screening for deficiency anemia         Screening for diabetes mellitus         Screening for hyperlipidemia                Follow up  plan: No follow-ups on file.

## 2024-03-27 ENCOUNTER — Other Ambulatory Visit: Payer: Self-pay | Admitting: Nurse Practitioner

## 2024-03-27 DIAGNOSIS — G4700 Insomnia, unspecified: Secondary | ICD-10-CM

## 2024-03-29 NOTE — Telephone Encounter (Signed)
 Requested Prescriptions  Pending Prescriptions Disp Refills   traZODone  (DESYREL ) 50 MG tablet [Pharmacy Med Name: TRAZODONE  50 MG TABLET] 90 tablet 0    Sig: TAKE 1/2 TO 1 TABLET BY MOUTH AT BEDTIME AS NEEDED FOR SLEEP     Psychiatry: Antidepressants - Serotonin Modulator Passed - 03/29/2024 11:04 AM      Passed - Completed PHQ-2 or PHQ-9 in the last 360 days      Passed - Valid encounter within last 6 months    Recent Outpatient Visits           4 months ago Gastroesophageal reflux disease, unspecified whether esophagitis present   [facility] Gareth Mliss FALCON, FNP   6 months ago Palpitations   [facility] Leavy Mole, PA-C   9 months ago Depression with anxiety   [facility] Leavy Mole, PA-C

## 2024-04-24 ENCOUNTER — Other Ambulatory Visit: Payer: Self-pay | Admitting: Nurse Practitioner

## 2024-04-24 DIAGNOSIS — Z3044 Encounter for surveillance of vaginal ring hormonal contraceptive device: Secondary | ICD-10-CM

## 2024-04-25 NOTE — Telephone Encounter (Signed)
 Requested Prescriptions  Pending Prescriptions Disp Refills   etonogestrel -ethinyl estradiol  (NUVARING ) 0.12-0.015 MG/24HR vaginal ring [Pharmacy Med Name: ETONOGESTREL -EE VAGINAL RING] 3 each 0    Sig: INSERT 1 RING VAGINALLY AS DIRECTED. REMOVE AFTER 3 WEEKS & WAIT 7 DAYS BEFORE INSERTING A NEW RING     OB/GYN:  Contraceptives Passed - 04/25/2024 12:14 PM      Passed - Last BP in normal range    BP Readings from Last 1 Encounters:  12/15/23 128/80         Passed - Valid encounter within last 12 months    Recent Outpatient Visits           5 months ago Gastroesophageal reflux disease, unspecified whether esophagitis present   [facility] Gareth Mliss FALCON, FNP   7 months ago Palpitations   [facility] Leavy Mole, PA-C   10 months ago Depression with anxiety   [facility] Leavy Mole, PA-C              Passed - Patient is not a smoker
